# Patient Record
Sex: Female | Born: 1945
Health system: Southern US, Community
[De-identification: ages and names within clinical notes are randomized; demographics above are authoritative.]

## PROBLEM LIST (undated history)

## (undated) DIAGNOSIS — Z8719 Personal history of other diseases of the digestive system: Secondary | ICD-10-CM

## (undated) DIAGNOSIS — G709 Myoneural disorder, unspecified: Secondary | ICD-10-CM

## (undated) DIAGNOSIS — B029 Zoster without complications: Secondary | ICD-10-CM

## (undated) DIAGNOSIS — H179 Unspecified corneal scar and opacity: Secondary | ICD-10-CM

## (undated) DIAGNOSIS — R229 Localized swelling, mass and lump, unspecified: Secondary | ICD-10-CM

## (undated) DIAGNOSIS — K5909 Other constipation: Secondary | ICD-10-CM

## (undated) DIAGNOSIS — E559 Vitamin D deficiency, unspecified: Secondary | ICD-10-CM

## (undated) DIAGNOSIS — S7290XA Unspecified fracture of unspecified femur, initial encounter for closed fracture: Secondary | ICD-10-CM

## (undated) DIAGNOSIS — D649 Anemia, unspecified: Secondary | ICD-10-CM

## (undated) DIAGNOSIS — D509 Iron deficiency anemia, unspecified: Secondary | ICD-10-CM

## (undated) DIAGNOSIS — M1990 Unspecified osteoarthritis, unspecified site: Secondary | ICD-10-CM

## (undated) DIAGNOSIS — M810 Age-related osteoporosis without current pathological fracture: Secondary | ICD-10-CM

## (undated) DIAGNOSIS — M4802 Spinal stenosis, cervical region: Secondary | ICD-10-CM

## (undated) DIAGNOSIS — Z973 Presence of spectacles and contact lenses: Secondary | ICD-10-CM

## (undated) DIAGNOSIS — R269 Unspecified abnormalities of gait and mobility: Principal | ICD-10-CM

## (undated) DIAGNOSIS — H53143 Visual discomfort, bilateral: Secondary | ICD-10-CM

## (undated) HISTORY — PX: CATARACT EXTRACTION W/ INTRAOCULAR LENS  IMPLANT, BILATERAL: SHX1307

## (undated) HISTORY — PX: TUBAL LIGATION: SHX77

## (undated) HISTORY — DX: Zoster without complications: B02.9

## (undated) HISTORY — PX: VITRECTOMY AND CATARACT: SHX6184

## (undated) HISTORY — PX: ORIF FEMUR FRACTURE: SHX2119

## (undated) HISTORY — PX: ABDOMINAL HERNIA REPAIR: SHX539

## (undated) HISTORY — PX: OTHER SURGICAL HISTORY: SHX169

## (undated) HISTORY — DX: Spinal stenosis, cervical region: M48.02

## (undated) HISTORY — DX: Unspecified abnormalities of gait and mobility: R26.9

## (undated) HISTORY — PX: CATARACT EXTRACTION: SUR2

## (undated) HISTORY — DX: Anemia, unspecified: D64.9

## (undated) HISTORY — DX: Vitamin D deficiency, unspecified: E55.9

## (undated) HISTORY — DX: Unspecified fracture of unspecified femur, initial encounter for closed fracture: S72.90XA

## (undated) HISTORY — DX: Age-related osteoporosis without current pathological fracture: M81.0

## (undated) HISTORY — PX: BACK SURGERY: SHX140

---

## 1998-07-02 ENCOUNTER — Other Ambulatory Visit: Admission: RE | Admit: 1998-07-02 | Discharge: 1998-07-02 | Payer: Self-pay | Admitting: Obstetrics & Gynecology

## 1999-02-05 ENCOUNTER — Emergency Department (HOSPITAL_COMMUNITY): Admission: EM | Admit: 1999-02-05 | Discharge: 1999-02-06 | Payer: Self-pay | Admitting: Emergency Medicine

## 1999-02-05 ENCOUNTER — Encounter: Payer: Self-pay | Admitting: Emergency Medicine

## 1999-10-08 ENCOUNTER — Other Ambulatory Visit: Admission: RE | Admit: 1999-10-08 | Discharge: 1999-10-08 | Payer: Self-pay | Admitting: Obstetrics and Gynecology

## 2001-10-14 ENCOUNTER — Other Ambulatory Visit: Admission: RE | Admit: 2001-10-14 | Discharge: 2001-10-14 | Payer: Self-pay | Admitting: Family Medicine

## 2003-02-16 ENCOUNTER — Ambulatory Visit (HOSPITAL_COMMUNITY): Admission: RE | Admit: 2003-02-16 | Discharge: 2003-02-16 | Payer: Self-pay | Admitting: Gastroenterology

## 2006-01-06 ENCOUNTER — Ambulatory Visit (HOSPITAL_COMMUNITY): Admission: RE | Admit: 2006-01-06 | Discharge: 2006-01-06 | Payer: Self-pay | Admitting: Nurse Practitioner

## 2007-01-29 ENCOUNTER — Ambulatory Visit (HOSPITAL_COMMUNITY): Admission: RE | Admit: 2007-01-29 | Discharge: 2007-01-29 | Payer: Self-pay | Admitting: Sports Medicine

## 2007-02-17 ENCOUNTER — Encounter: Admission: RE | Admit: 2007-02-17 | Discharge: 2007-02-17 | Payer: Self-pay | Admitting: Family Medicine

## 2008-02-15 ENCOUNTER — Ambulatory Visit (HOSPITAL_COMMUNITY): Admission: RE | Admit: 2008-02-15 | Discharge: 2008-02-15 | Payer: Self-pay | Admitting: Family Medicine

## 2008-06-06 ENCOUNTER — Encounter: Admission: RE | Admit: 2008-06-06 | Discharge: 2008-06-06 | Payer: Self-pay | Admitting: Family Medicine

## 2009-07-06 ENCOUNTER — Ambulatory Visit (HOSPITAL_COMMUNITY): Admission: RE | Admit: 2009-07-06 | Discharge: 2009-07-06 | Payer: Self-pay | Admitting: Family Medicine

## 2010-07-22 ENCOUNTER — Ambulatory Visit (HOSPITAL_COMMUNITY): Admission: RE | Admit: 2010-07-22 | Discharge: 2010-07-22 | Payer: Self-pay | Admitting: Family Medicine

## 2010-12-29 ENCOUNTER — Encounter: Payer: Self-pay | Admitting: Family Medicine

## 2011-02-06 ENCOUNTER — Observation Stay (HOSPITAL_COMMUNITY)
Admission: RE | Admit: 2011-02-06 | Discharge: 2011-02-07 | Disposition: A | Discharge status: Home / Self Care | Payer: Medicare Other | Source: Ambulatory Visit | Attending: Ophthalmology | Admitting: Ophthalmology

## 2011-02-06 ENCOUNTER — Other Ambulatory Visit: Payer: Self-pay | Admitting: Ophthalmology

## 2011-02-06 DIAGNOSIS — H3530 Unspecified macular degeneration: Secondary | ICD-10-CM | POA: Insufficient documentation

## 2011-02-06 DIAGNOSIS — Z23 Encounter for immunization: Secondary | ICD-10-CM | POA: Insufficient documentation

## 2011-02-06 DIAGNOSIS — T8529XA Other mechanical complication of intraocular lens, initial encounter: Principal | ICD-10-CM | POA: Insufficient documentation

## 2011-02-06 DIAGNOSIS — H33309 Unspecified retinal break, unspecified eye: Secondary | ICD-10-CM | POA: Insufficient documentation

## 2011-02-06 DIAGNOSIS — Y831 Surgical operation with implant of artificial internal device as the cause of abnormal reaction of the patient, or of later complication, without mention of misadventure at the time of the procedure: Secondary | ICD-10-CM | POA: Insufficient documentation

## 2011-02-06 LAB — BASIC METABOLIC PANEL
Calcium: 9.1 mg/dL (ref 8.4–10.5)
Creatinine, Ser: 0.72 mg/dL (ref 0.4–1.2)
GFR calc Af Amer: >60 mL/min (ref >60)
GFR calc non Af Amer: >60 mL/min (ref >60)
Sodium: 139 mEq/L (ref 135–145)

## 2011-02-06 LAB — CBC
MCH: 33.8 pg (ref 26.0–34.0)
MCHC: 34.5 g/dL (ref 30.0–36.0)
Platelets: 160 10*3/uL (ref 150–400)

## 2011-02-06 LAB — SURGICAL PCR SCREEN
MRSA, PCR: NEGATIVE
Staphylococcus aureus: NEGATIVE

## 2011-02-10 NOTE — Op Note (Signed)
Amanda West, Amanda West                 ACCOUNT NO.:  [id number]  MEDICAL RECORD NO.:  [id number]           PATIENT TYPE:  I  LOCATION:  5121                         FACILITY:  [facility]  PHYSICIAN:  Beulah Gandy. Ashley Royalty, M.D. DATE OF BIRTH:  12/17/1945  DATE OF PROCEDURE:  02/06/2011 DATE OF DISCHARGE:                              OPERATIVE REPORT   ADMISSION DIAGNOSIS:  Dislocated intraocular lens and vitreous opacities and retinal breaks, right eye.  PROCEDURES:  Pars plana vitrectomy, retinal photocoagulation, removal of intraocular lens from the vitreous, placement of secondary intraocular lens with suture in the right eye.  SURGEON:  Beulah Gandy. Ashley Royalty, MD  ASSISTANT:  Rosalie Doctor, MA  ANESTHESIA:  General.  DETAILS:  Usual prep and drape, indirect ophthalmoscope laser was moved into place.  Several areas of thin retina and ghost retinal vessels were seen in the far periphery and treated with the indirect ophthalmoscope laser and the power was 200 milliwatts, 814 burns, 1000 microns each, and 0.1 seconds each.  Attention was then carried to the pars plana area where the conjunctival peritomy was carried out from 8 o'clock around to 4 o'clock.  Half-thickness scleral flaps were raised at 3 and 9 o'clock in anticipation of IOL suture.  Corneoscleral wound was created from 10 o'clock to 2 o'clock in the posterior white area of the sclera.  A three- layered wound was made into the anterior chamber.  Pars plana vitrectomy was performed with 25-gauge trocars at 8, 10, and 2 o'clock.  The vitrectomy was started in the pupillary axis where vitreous was seen. This was entangling the intraocular lens.  The lens was freed carefully with the suction cutter until it was allowed to lie free in the vitreous cavity.  The vitrectomy was expanded to the mid periphery and the far periphery out to the vitreous base.  Additional areas of breaks were seen in the lower temporal quadrant.  The endolaser  was positioned in the eye with a power of 1000 milliwatts, 556 burns were placed around retinal breaks, spot size 1000.  Duration 0.1 seconds each.  The intraocular lens was grasped with 25-gauge forceps passed into the anterior chamber then dialed out of the eye through the corneal scleral wound.  2 Prolene sutures were passed beneath the scleral flaps behind the iris and anterior to the capsular shelf from 3 o'clock to 9 o'clock. The sutures were externalized through the corneoscleral wound.  A new intraocular lens made by [employer]. model CZ70BD power 23.0D, length 12.5 mm, optic 7.0 mm, serial number [id number], expiration date January 2015 was brought onto the field, inspected, and cleaned.  Prolene sutures were attached to the eyelets of the lens. Lens was passed into the anterior chamber and then into the posterior chamber.  It was dialed into place and the Prolene sutures were drawn securely on the external surface.  The Prolene suture was sutured beneath the scleral flaps and the scleral flaps were allowed to lie overthe suture knots.  The corneal wound was closed with 5 interrupted 10-0 nylon sutures, it was tested and found to be tight.  Additional  vitrectomy was performed at this time and pigment was removed from the vitreous cavity.  30% gas fluid exchange was performed.  The instruments were removed from the eye.  The trocars were removed from the eye.  The wounds were tested and found to be tight.  The conjunctiva was reapproximated with 7-0 chromic suture.  Polymyxin and gentamicin were irrigated into [place] space.  Marcaine was injected around globe for postop pain.  Decadron 10 mg was injected into the lower subconjunctival space.  Closing pressure was 10 with a Risk manager.  COMPLICATIONS:  None.  DURATION:  1 hour and 30 minutes.  The patient was awakened, taken to recovery in satisfactory condition.     Beulah Gandy. Ashley Royalty, M.D.     JDM/MEDQ   D:  02/06/2011  T:  02/07/2011  Job:  [id number]  Electronically Signed by Alan Mulder M.D. on 02/10/2011 06:45:41 AM

## 2011-04-25 NOTE — Op Note (Signed)
   NAMESHAUNDREA, CARRIGG                             ACCOUNT NO.:  [id number]   MEDICAL RECORD NO.:  [id number]                   PATIENT TYPE:  AMB   LOCATION:  ENDO                                 FACILITY:  [facility]   PHYSICIAN:  Danise Edge, M.D.                DATE OF BIRTH:  09-12-1946   DATE OF PROCEDURE:  02/16/2003  DATE OF DISCHARGE:                                 OPERATIVE REPORT   REFERRING PHYSICIAN:  Donia Guiles, M.D.   PROCEDURE PERFORMED:  Screening colonoscopy.   ENDOSCOPIST:  Danise Edge, M.D.   PREMEDICATION:  Demerol 30 mg, Versed 5 mg.   ENDOSCOPE:  Olympus pediatric colonoscope.   INDICATIONS FOR PROCEDURE:  The patient is a 65 year old female born  1946/03/26.  The patient is scheduled to undergo her first screening  colonoscopy with polypectomy to prevent colon cancer.  I discussed with her  the complications associated with colonoscopy and polypectomy including a  15:1000 risk of bleeding and 1:1000 risk of colon perforation.  The patient  has signed the operative permit.   DESCRIPTION OF PROCEDURE:  After obtaining informed consent, the patient was  placed in the left lateral decubitus position.  I administered intravenous  Demerol and intravenous Versed to achieve conscious sedation for the  procedure.  The patient's blood pressure, oxygen saturation, and cardiac  rhythm were monitored throughout the procedure and documented in the medical  record.   Anal inspection was normal.  Digital rectal exam was normal.  The Olympus  pediatric video colonoscope was introduced into the rectum and easily  advanced to the cecum.  A normal appearing ileocecal valve was intubated and  the distal ileum inspected.  Colonic preparation for the exam today was  excellent.   The patient does have mucosal changes consistent with melanosis coli  throughout the rectum and colon.   RECTUM:  Normal.   SIGMOID COLON AND DESCENDING COLON:  Normal.   SPLENIC  FLEXURE:  Normal.   TRANSVERSE COLON:  Normal.   HEPATIC FLEXURE:  Normal.   ASCENDING COLON:  Normal.   ASCENDING COLON:  Normal.   CECUM AND ILEOCECAL VALVE:  Normal.   DISTAL ILEUM:  Normal.   ASSESSMENT:  Normal screening proctocolonoscopy to the cecum.  No endoscopic  evidence for the presence of colorectal neoplasia.  Melanosis coli noted  secondary to laxative use.                                                Danise Edge, M.D.    MJ/MEDQ  D:  02/16/2003  T:  02/16/2003  Job:  [id number]   cc:   Donia Guiles, M.D.  [address] [place]  [place] [address]  Fax: [phone]

## 2011-08-14 ENCOUNTER — Other Ambulatory Visit: Payer: Self-pay | Admitting: Family Medicine

## 2011-08-14 DIAGNOSIS — Z1231 Encounter for screening mammogram for malignant neoplasm of breast: Secondary | ICD-10-CM

## 2011-08-27 ENCOUNTER — Ambulatory Visit (HOSPITAL_COMMUNITY)
Admission: RE | Admit: 2011-08-27 | Discharge: 2011-08-27 | Disposition: A | Discharge status: Home / Self Care | Payer: Medicare Other | Source: Ambulatory Visit | Attending: Family Medicine | Admitting: Family Medicine

## 2011-08-27 DIAGNOSIS — Z1231 Encounter for screening mammogram for malignant neoplasm of breast: Secondary | ICD-10-CM | POA: Insufficient documentation

## 2012-06-23 ENCOUNTER — Ambulatory Visit (INDEPENDENT_AMBULATORY_CARE_PROVIDER_SITE_OTHER): Payer: Medicare Other | Admitting: Ophthalmology

## 2012-06-23 DIAGNOSIS — H2700 Aphakia, unspecified eye: Secondary | ICD-10-CM

## 2012-06-23 DIAGNOSIS — H27139 Posterior dislocation of lens, unspecified eye: Secondary | ICD-10-CM

## 2012-06-23 DIAGNOSIS — H353190 Nonexudative age-related macular degeneration, unspecified eye, stage unspecified: Secondary | ICD-10-CM

## 2012-06-23 DIAGNOSIS — H43819 Vitreous degeneration, unspecified eye: Secondary | ICD-10-CM

## 2012-06-24 ENCOUNTER — Encounter (HOSPITAL_COMMUNITY): Payer: Self-pay | Admitting: Pharmacy Technician

## 2012-06-24 DIAGNOSIS — H27139 Posterior dislocation of lens, unspecified eye: Secondary | ICD-10-CM | POA: Diagnosis present

## 2012-06-24 DIAGNOSIS — T8522XA Displacement of intraocular lens, initial encounter: Secondary | ICD-10-CM | POA: Diagnosis present

## 2012-06-24 NOTE — H&P (Signed)
Vergene Marland is an 66 y.o. female.   Chief Complaint: blurred vision, hazy vision HPI: intraocular lens dislocation inferiorly and in the vitreous left eyr  No past medical history on file.  No past surgical history on file.  No family history on file. Social History:  does not have a smoking history on file. She does not have any smokeless tobacco history on file. Her alcohol and drug histories not on file.  Allergies: Allergies not on file  No prescriptions prior to admission    Review of systems otherwise negative  There were no vitals taken for this visit.  Physical exam: Mental status: oriented x3. Eyes: See eye exam associated with this date of surgery in media tab.  Scanned in by scanning center Ears, Nose, Throat: within normal limits Neck: Within Normal limits General: within normal limits Chest: Within normal limits Breast: deferred Heart: Within normal limits Abdomen: Within normal limits GU: deferred Extremities: within normal limits Skin: within normal limits  Assessment/Plan Dislocated Intraocular lens Plan: To [facility] for Pars plana vitrectomy, removal of Intraocular lens, placement of secondary intraocular lens left eye  MATTHEWS, JOHN D 06/24/2012, 7:15 AM

## 2012-07-05 ENCOUNTER — Encounter (HOSPITAL_COMMUNITY): Payer: Self-pay | Admitting: *Deleted

## 2012-07-05 MED ORDER — TROPICAMIDE 1 % OP SOLN
1.0000 [drp] | OPHTHALMIC | Status: AC | PRN
  Administered 2012-07-06 (×3): 1 [drp] via OPHTHALMIC
  Filled 2012-07-05: qty 3

## 2012-07-05 MED ORDER — PHENYLEPHRINE HCL 2.5 % OP SOLN
1.0000 [drp] | OPHTHALMIC | Status: AC | PRN
  Administered 2012-07-06 (×3): 1 [drp] via OPHTHALMIC
  Filled 2012-07-05: qty 3

## 2012-07-05 MED ORDER — GATIFLOXACIN 0.5 % OP SOLN
1.0000 [drp] | OPHTHALMIC | Status: AC | PRN
  Administered 2012-07-06 (×3): 1 [drp] via OPHTHALMIC
  Filled 2012-07-05: qty 2.5

## 2012-07-05 MED ORDER — CEFAZOLIN SODIUM-DEXTROSE 2-3 GM-% IV SOLR
2.0000 g | INTRAVENOUS | Status: AC
  Administered 2012-07-06: 2 g via INTRAVENOUS
  Filled 2012-07-05: qty 50

## 2012-07-05 MED ORDER — CYCLOPENTOLATE HCL 1 % OP SOLN
1.0000 [drp] | OPHTHALMIC | Status: AC | PRN
  Administered 2012-07-06 (×3): 1 [drp] via OPHTHALMIC
  Filled 2012-07-05: qty 2

## 2012-07-05 NOTE — Progress Notes (Signed)
I left message on pt's voice mail with time change, pt was to arrive at 0900 when OR was scheduled at  1315.  I instructed patient to arrive by 0800 if she had not heard anything different from Dr Ashley Royalty.

## 2012-07-06 ENCOUNTER — Encounter (HOSPITAL_COMMUNITY): Payer: Self-pay | Admitting: Anesthesiology

## 2012-07-06 ENCOUNTER — Encounter (HOSPITAL_COMMUNITY)
Admission: RE | Disposition: A | Discharge status: Home / Self Care | Payer: Self-pay | Source: Ambulatory Visit | Attending: Ophthalmology

## 2012-07-06 ENCOUNTER — Ambulatory Visit (HOSPITAL_COMMUNITY): Payer: Medicare Other

## 2012-07-06 ENCOUNTER — Ambulatory Visit (HOSPITAL_COMMUNITY): Payer: Medicare Other | Admitting: Anesthesiology

## 2012-07-06 ENCOUNTER — Ambulatory Visit (HOSPITAL_COMMUNITY)
Admission: RE | Admit: 2012-07-06 | Discharge: 2012-07-07 | Disposition: A | Discharge status: Home / Self Care | Payer: Medicare Other | Source: Ambulatory Visit | Attending: Ophthalmology | Admitting: Ophthalmology

## 2012-07-06 ENCOUNTER — Encounter (HOSPITAL_COMMUNITY): Payer: Self-pay | Admitting: *Deleted

## 2012-07-06 DIAGNOSIS — H27139 Posterior dislocation of lens, unspecified eye: Secondary | ICD-10-CM

## 2012-07-06 DIAGNOSIS — H33309 Unspecified retinal break, unspecified eye: Secondary | ICD-10-CM

## 2012-07-06 DIAGNOSIS — T8522XA Displacement of intraocular lens, initial encounter: Secondary | ICD-10-CM

## 2012-07-06 DIAGNOSIS — T8529XA Other mechanical complication of intraocular lens, initial encounter: Secondary | ICD-10-CM | POA: Insufficient documentation

## 2012-07-06 DIAGNOSIS — H26499 Other secondary cataract, unspecified eye: Secondary | ICD-10-CM

## 2012-07-06 DIAGNOSIS — Y831 Surgical operation with implant of artificial internal device as the cause of abnormal reaction of the patient, or of later complication, without mention of misadventure at the time of the procedure: Secondary | ICD-10-CM | POA: Insufficient documentation

## 2012-07-06 HISTORY — DX: Personal history of other diseases of the digestive system: Z87.19

## 2012-07-06 HISTORY — PX: PARS PLANA VITRECTOMY: SHX2166

## 2012-07-06 SURGERY — PARS PLANA VITRECTOMY WITH 25G REMOVAL/SUTURE INTRAOCULAR LENS
Anesthesia: General | Laterality: Left | Wound class: Clean

## 2012-07-06 MED ORDER — ACETAZOLAMIDE SODIUM 500 MG IJ SOLR
INTRAMUSCULAR | Status: AC
  Filled 2012-07-06: qty 500

## 2012-07-06 MED ORDER — BSS PLUS IO SOLN
INTRAOCULAR | Status: AC
  Filled 2012-07-06: qty 500

## 2012-07-06 MED ORDER — POTASSIUM 99 MG PO TABS: 99.0000 mg | ORAL_TABLET | Freq: Every day | ORAL | Status: DC

## 2012-07-06 MED ORDER — MUPIROCIN 2 % EX OINT
TOPICAL_OINTMENT | CUTANEOUS | Status: AC
  Administered 2012-07-06: 1 via NASAL
  Filled 2012-07-06: qty 22

## 2012-07-06 MED ORDER — GATIFLOXACIN 0.5 % OP SOLN: 1.0000 [drp] | Freq: Four times a day (QID) | OPHTHALMIC | Status: DC

## 2012-07-06 MED ORDER — MINERAL OIL LIGHT 100 % EX OIL
TOPICAL_OIL | CUTANEOUS | Status: AC
  Filled 2012-07-06: qty 25

## 2012-07-06 MED ORDER — EPINEPHRINE HCL 1 MG/ML IJ SOLN
INTRAMUSCULAR | Status: AC
  Filled 2012-07-06: qty 1

## 2012-07-06 MED ORDER — LIDOCAINE HCL 1 % IJ SOLN
INTRAMUSCULAR | Status: DC | PRN
  Administered 2012-07-06: 60 mg via INTRADERMAL

## 2012-07-06 MED ORDER — HEMOSTATIC AGENTS (NO CHARGE) OPTIME
TOPICAL | Status: DC | PRN
  Administered 2012-07-06: 1 via TOPICAL

## 2012-07-06 MED ORDER — ONDANSETRON HCL 4 MG/2ML IJ SOLN
INTRAMUSCULAR | Status: DC | PRN
  Administered 2012-07-06: 4 mg via INTRAVENOUS

## 2012-07-06 MED ORDER — POLYMYXIN B SULFATE 500000 UNITS IJ SOLR
INTRAMUSCULAR | Status: AC
  Filled 2012-07-06: qty 1

## 2012-07-06 MED ORDER — BUPIVACAINE HCL 0.75 % IJ SOLN
INTRAMUSCULAR | Status: AC
  Filled 2012-07-06: qty 10

## 2012-07-06 MED ORDER — MINERAL OIL LIGHT 100 % EX OIL
TOPICAL_OIL | CUTANEOUS | Status: DC | PRN
  Administered 2012-07-06: 1 via TOPICAL

## 2012-07-06 MED ORDER — GENTAMICIN SULFATE 40 MG/ML IJ SOLN
INTRAMUSCULAR | Status: AC
  Filled 2012-07-06: qty 2

## 2012-07-06 MED ORDER — SODIUM CHLORIDE 0.45 % IV SOLN
INTRAVENOUS | Status: DC
  Administered 2012-07-06: 20 mL/h via INTRAVENOUS

## 2012-07-06 MED ORDER — PROMETHAZINE HCL 25 MG/ML IJ SOLN: 6.2500 mg | INTRAMUSCULAR | Status: DC | PRN

## 2012-07-06 MED ORDER — SODIUM CHLORIDE 0.9 % IV SOLN
INTRAVENOUS | Status: DC | PRN
  Administered 2012-07-06 (×2): via INTRAVENOUS

## 2012-07-06 MED ORDER — MAGNESIUM HYDROXIDE 400 MG/5ML PO SUSP
15.0000 mL | Freq: Four times a day (QID) | ORAL | Status: DC | PRN
  Administered 2012-07-07: 30 mL via ORAL
  Filled 2012-07-06: qty 30

## 2012-07-06 MED ORDER — BSS IO SOLN
INTRAOCULAR | Status: AC
  Filled 2012-07-06: qty 15

## 2012-07-06 MED ORDER — MEPERIDINE HCL 25 MG/ML IJ SOLN: 6.2500 mg | INTRAMUSCULAR | Status: DC | PRN

## 2012-07-06 MED ORDER — FENTANYL CITRATE 0.05 MG/ML IJ SOLN
INTRAMUSCULAR | Status: DC | PRN
  Administered 2012-07-06 (×2): 50 ug via INTRAVENOUS

## 2012-07-06 MED ORDER — SODIUM CHLORIDE 0.9 % IJ SOLN
INTRAMUSCULAR | Status: DC | PRN
  Administered 2012-07-06: 13:00:00

## 2012-07-06 MED ORDER — EPINEPHRINE HCL 1 MG/ML IJ SOLN
INTRAOCULAR | Status: DC | PRN
  Administered 2012-07-06: 11:00:00

## 2012-07-06 MED ORDER — BRIMONIDINE TARTRATE 0.2 % OP SOLN
1.0000 [drp] | Freq: Two times a day (BID) | OPHTHALMIC | Status: DC
  Filled 2012-07-06: qty 5

## 2012-07-06 MED ORDER — LATANOPROST 0.005 % OP SOLN
1.0000 [drp] | Freq: Every day | OPHTHALMIC | Status: DC
  Filled 2012-07-06 (×2): qty 2.5

## 2012-07-06 MED ORDER — HYALURONIDASE HUMAN 150 UNIT/ML IJ SOLN
INTRAMUSCULAR | Status: AC
  Filled 2012-07-06: qty 1

## 2012-07-06 MED ORDER — BSS PLUS IO SOLN
INTRAOCULAR | Status: DC | PRN
  Administered 2012-07-06: 1 via INTRAOCULAR

## 2012-07-06 MED ORDER — NEOSTIGMINE METHYLSULFATE 1 MG/ML IJ SOLN
INTRAMUSCULAR | Status: DC | PRN
  Administered 2012-07-06: 3 mg via INTRAVENOUS

## 2012-07-06 MED ORDER — PREDNISOLONE ACETATE 1 % OP SUSP
1.0000 [drp] | Freq: Four times a day (QID) | OPHTHALMIC | Status: DC
  Filled 2012-07-06: qty 1
  Filled 2012-07-06: qty 5

## 2012-07-06 MED ORDER — GLYCOPYRROLATE 0.2 MG/ML IJ SOLN
INTRAMUSCULAR | Status: DC | PRN
  Administered 2012-07-06: .4 mg via INTRAVENOUS
  Administered 2012-07-06: 0.2 mg via INTRAVENOUS

## 2012-07-06 MED ORDER — MIDAZOLAM HCL 5 MG/5ML IJ SOLN
INTRAMUSCULAR | Status: DC | PRN
  Administered 2012-07-06: 2 mg via INTRAVENOUS

## 2012-07-06 MED ORDER — ATROPINE SULFATE 1 % OP SOLN
OPHTHALMIC | Status: AC
  Filled 2012-07-06: qty 2

## 2012-07-06 MED ORDER — PROPOFOL 10 MG/ML IV EMUL
INTRAVENOUS | Status: DC | PRN
  Administered 2012-07-06: 150 mg via INTRAVENOUS

## 2012-07-06 MED ORDER — BUPIVACAINE HCL 0.75 % IJ SOLN
INTRAMUSCULAR | Status: DC | PRN
  Administered 2012-07-06: 10 mL

## 2012-07-06 MED ORDER — SODIUM HYALURONATE 10 MG/ML IO SOLN
INTRAOCULAR | Status: AC
  Filled 2012-07-06: qty 0.85

## 2012-07-06 MED ORDER — BACITRACIN-POLYMYXIN B 500-10000 UNIT/GM OP OINT
TOPICAL_OINTMENT | OPHTHALMIC | Status: AC
  Filled 2012-07-06: qty 3.5

## 2012-07-06 MED ORDER — BRIMONIDINE TARTRATE 0.2 % OP SOLN
1.0000 [drp] | Freq: Two times a day (BID) | OPHTHALMIC | Status: DC
  Filled 2012-07-06 (×2): qty 5

## 2012-07-06 MED ORDER — HYDROCODONE-ACETAMINOPHEN 5-325 MG PO TABS: 1.0000 | ORAL_TABLET | ORAL | Status: DC | PRN

## 2012-07-06 MED ORDER — BSS IO SOLN
INTRAOCULAR | Status: DC | PRN
  Administered 2012-07-06: 15 mL via INTRAOCULAR

## 2012-07-06 MED ORDER — TETRACAINE HCL 0.5 % OP SOLN
2.0000 [drp] | Freq: Once | OPHTHALMIC | Status: DC
  Filled 2012-07-06: qty 2

## 2012-07-06 MED ORDER — ROCURONIUM BROMIDE 100 MG/10ML IV SOLN
INTRAVENOUS | Status: DC | PRN
  Administered 2012-07-06: 40 mg via INTRAVENOUS

## 2012-07-06 MED ORDER — MORPHINE SULFATE 2 MG/ML IJ SOLN: 1.0000 mg | INTRAMUSCULAR | Status: DC | PRN

## 2012-07-06 MED ORDER — PREDNISOLONE ACETATE 1 % OP SUSP
1.0000 [drp] | Freq: Four times a day (QID) | OPHTHALMIC | Status: DC
  Filled 2012-07-06: qty 1

## 2012-07-06 MED ORDER — OXYCODONE HCL 5 MG/5ML PO SOLN: 5.0000 mg | Freq: Once | ORAL | Status: DC | PRN

## 2012-07-06 MED ORDER — GATIFLOXACIN 0.5 % OP SOLN
1.0000 [drp] | Freq: Four times a day (QID) | OPHTHALMIC | Status: DC
  Filled 2012-07-06: qty 2.5

## 2012-07-06 MED ORDER — HYDROMORPHONE HCL PF 1 MG/ML IJ SOLN
0.2500 mg | INTRAMUSCULAR | Status: DC | PRN
  Administered 2012-07-06 (×2): 0.5 mg via INTRAVENOUS

## 2012-07-06 MED ORDER — EPHEDRINE SULFATE 50 MG/ML IJ SOLN
INTRAMUSCULAR | Status: DC | PRN
  Administered 2012-07-06: 10 mg via INTRAVENOUS

## 2012-07-06 MED ORDER — DEXAMETHASONE SODIUM PHOSPHATE 10 MG/ML IJ SOLN
INTRAMUSCULAR | Status: AC
  Filled 2012-07-06: qty 1

## 2012-07-06 MED ORDER — HYDROMORPHONE HCL PF 1 MG/ML IJ SOLN
INTRAMUSCULAR | Status: AC
  Filled 2012-07-06: qty 1

## 2012-07-06 MED ORDER — DEXAMETHASONE SODIUM PHOSPHATE 10 MG/ML IJ SOLN
INTRAMUSCULAR | Status: DC | PRN
  Administered 2012-07-06: 10 mg

## 2012-07-06 MED ORDER — LIDOCAINE HCL 2 % IJ SOLN
INTRAMUSCULAR | Status: AC
  Filled 2012-07-06: qty 1

## 2012-07-06 MED ORDER — BACITRACIN-POLYMYXIN B 500-10000 UNIT/GM OP OINT
1.0000 "application " | TOPICAL_OINTMENT | Freq: Four times a day (QID) | OPHTHALMIC | Status: DC
  Filled 2012-07-06: qty 3.5

## 2012-07-06 MED ORDER — HYPROMELLOSE (GONIOSCOPIC) 2.5 % OP SOLN
OPHTHALMIC | Status: AC
  Filled 2012-07-06: qty 15

## 2012-07-06 MED ORDER — DOCUSATE SODIUM 100 MG PO CAPS
100.0000 mg | ORAL_CAPSULE | Freq: Two times a day (BID) | ORAL | Status: DC
  Administered 2012-07-06: 100 mg via ORAL
  Filled 2012-07-06: qty 1

## 2012-07-06 MED ORDER — PROVISC 10 MG/ML IO SOLN
INTRAOCULAR | Status: DC | PRN
  Administered 2012-07-06: .85 mL via INTRAOCULAR

## 2012-07-06 MED ORDER — ACETAZOLAMIDE SODIUM 500 MG IJ SOLR: 500.0000 mg | Freq: Once | INTRAMUSCULAR | Status: DC

## 2012-07-06 MED ORDER — OXYCODONE HCL 5 MG PO TABS: 5.0000 mg | ORAL_TABLET | Freq: Once | ORAL | Status: DC | PRN

## 2012-07-06 MED ORDER — LATANOPROST 0.005 % OP SOLN
1.0000 [drp] | Freq: Every day | OPHTHALMIC | Status: DC
  Filled 2012-07-06: qty 2.5

## 2012-07-06 MED ORDER — ACETAZOLAMIDE SODIUM 500 MG IJ SOLR
500.0000 mg | Freq: Once | INTRAMUSCULAR | Status: AC
  Administered 2012-07-07: 500 mg via INTRAVENOUS
  Filled 2012-07-06: qty 500

## 2012-07-06 MED ORDER — TEMAZEPAM 15 MG PO CAPS: 15.0000 mg | ORAL_CAPSULE | Freq: Every evening | ORAL | Status: DC | PRN

## 2012-07-06 MED ORDER — BACITRACIN-POLYMYXIN B 500-10000 UNIT/GM OP OINT
TOPICAL_OINTMENT | OPHTHALMIC | Status: DC | PRN
  Administered 2012-07-06: 1 via OPHTHALMIC

## 2012-07-06 MED ORDER — SODIUM CHLORIDE 0.9 % IV SOLN
INTRAVENOUS | Status: DC
  Administered 2012-07-06: 11:00:00 via INTRAVENOUS

## 2012-07-06 MED ORDER — TRIAMCINOLONE ACETONIDE 40 MG/ML IJ SUSP
INTRAMUSCULAR | Status: AC
  Filled 2012-07-06: qty 1

## 2012-07-06 MED ORDER — ACETAMINOPHEN 325 MG PO TABS
325.0000 mg | ORAL_TABLET | ORAL | Status: DC | PRN
  Administered 2012-07-06: 650 mg via ORAL
  Administered 2012-07-07: 325 mg via ORAL
  Filled 2012-07-06: qty 1
  Filled 2012-07-06: qty 2

## 2012-07-06 MED ORDER — ONDANSETRON HCL 4 MG/2ML IJ SOLN: 4.0000 mg | Freq: Four times a day (QID) | INTRAMUSCULAR | Status: DC | PRN

## 2012-07-06 SURGICAL SUPPLY — 69 items
ACCESSORY FRAGMATOME (MISCELLANEOUS) IMPLANT
APPLICATOR DR MATTHEWS STRL (MISCELLANEOUS) ×2 IMPLANT
BLADE EYE CATARACT 19 1.4 BEAV (BLADE) IMPLANT
BLADE KERATOME 2.75 (BLADE) ×2 IMPLANT
CANNULA FLEX TIP 25G (CANNULA) IMPLANT
CLOTH BEACON ORANGE TIMEOUT ST (SAFETY) ×2 IMPLANT
CORDS BIPOLAR (ELECTRODE) IMPLANT
COTTONBALL LRG STERILE PKG (GAUZE/BANDAGES/DRESSINGS) ×6 IMPLANT
COVER MAYO STAND STRL (DRAPES) IMPLANT
DRAPE INCISE 51X51 W/FILM STRL (DRAPES) IMPLANT
DRAPE OPHTHALMIC 77X100 STRL (CUSTOM PROCEDURE TRAY) ×2 IMPLANT
FILTER BLUE MILLIPORE (MISCELLANEOUS) IMPLANT
FORCEPS ECKARDT ILM 25G SERR (OPHTHALMIC RELATED) ×2 IMPLANT
FORCEPS HORIZONTAL 25G DISP (OPHTHALMIC RELATED) IMPLANT
GAS OPHTHALMIC (MISCELLANEOUS) IMPLANT
GLOVE BIOGEL PI IND STRL 6.5 (GLOVE) ×1 IMPLANT
GLOVE BIOGEL PI INDICATOR 6.5 (GLOVE) ×1
GLOVE SS BIOGEL STRL SZ 6.5 (GLOVE) ×3 IMPLANT
GLOVE SS BIOGEL STRL SZ 7 (GLOVE) ×1 IMPLANT
GLOVE SUPERSENSE BIOGEL SZ 6.5 (GLOVE) ×3
GLOVE SUPERSENSE BIOGEL SZ 7 (GLOVE) ×1
GLOVE SURG 8.5 LATEX PF (GLOVE) ×2 IMPLANT
GLOVE SURG SS PI 6.5 STRL IVOR (GLOVE) ×4 IMPLANT
GOWN STRL NON-REIN LRG LVL3 (GOWN DISPOSABLE) ×14 IMPLANT
ILLUMINATOR CHOW PICK 25GA (MISCELLANEOUS) ×2 IMPLANT
KIT BASIN OR (CUSTOM PROCEDURE TRAY) ×2 IMPLANT
KIT ROOM TURNOVER OR (KITS) ×2 IMPLANT
KNIFE CRESCENT 1.75 EDGEAHEAD (BLADE) ×2 IMPLANT
LENS IOL POST 1PIECE DIOP 22.0 (Intraocular Lens) ×2 IMPLANT
MARKER SKIN DUAL TIP RULER LAB (MISCELLANEOUS) IMPLANT
MASK EYE SHIELD (GAUZE/BANDAGES/DRESSINGS) ×2 IMPLANT
MICROPICK 25G (MISCELLANEOUS)
NEEDLE 18GX1X1/2 (RX/OR ONLY) (NEEDLE) ×2 IMPLANT
NEEDLE 25GX 5/8IN NON SAFETY (NEEDLE) ×2 IMPLANT
NEEDLE 27GAX1X1/2 (NEEDLE) ×2 IMPLANT
NEEDLE FILTER BLUNT 18X 1/2SAF (NEEDLE) ×1
NEEDLE FILTER BLUNT 18X1 1/2 (NEEDLE) ×1 IMPLANT
NEEDLE HYPO 30X.5 LL (NEEDLE) ×2 IMPLANT
NS IRRIG 1000ML POUR BTL (IV SOLUTION) ×2 IMPLANT
PACK VITRECTOMY CUSTOM (CUSTOM PROCEDURE TRAY) ×2 IMPLANT
PAD ARMBOARD 7.5X6 YLW CONV (MISCELLANEOUS) ×4 IMPLANT
PAD EYE OVAL STERILE LF (GAUZE/BANDAGES/DRESSINGS) ×4 IMPLANT
PAK VITRECTOMY PIK 25 GA (OPHTHALMIC RELATED) ×2 IMPLANT
PENCIL BIPOLAR 25GA STR DISP (OPHTHALMIC RELATED) IMPLANT
PICK MICROPICK 25G (MISCELLANEOUS) IMPLANT
PROBE DIRECTIONAL LASER (MISCELLANEOUS) IMPLANT
PROBE VITREOUS 25+ ACCURUS (MISCELLANEOUS) IMPLANT
ROLLS DENTAL (MISCELLANEOUS) ×4 IMPLANT
SCRAPER DIAMOND 25GA (OPHTHALMIC RELATED) IMPLANT
SPEAR EYE SURG WECK-CEL (MISCELLANEOUS) ×6 IMPLANT
SPONGE SURGIFOAM ABS GEL 12-7 (HEMOSTASIS) ×2 IMPLANT
STOPCOCK 4 WAY LG BORE MALE ST (IV SETS) IMPLANT
SUT CHROMIC 7 0 TG140 8 (SUTURE) ×2 IMPLANT
SUT ETHILON 10 0 CS140 6 (SUTURE) ×2 IMPLANT
SUT ETHILON 9 0 BV100 4 (SUTURE) IMPLANT
SUT POLY NON ABSORB 10-0 8 STR (SUTURE) ×4 IMPLANT
SUT SILK 2 0 FS (SUTURE) ×2 IMPLANT
SUT SILK 4 0 RB 1 (SUTURE) IMPLANT
SYR 20CC LL (SYRINGE) ×2 IMPLANT
SYR 5ML LL (SYRINGE) IMPLANT
SYR BULB 3OZ (MISCELLANEOUS) ×2 IMPLANT
SYR TB 1ML LUER SLIP (SYRINGE) ×2 IMPLANT
SYRINGE 10CC LL (SYRINGE) IMPLANT
TAPE SURG TRANSPORE 1 IN (GAUZE/BANDAGES/DRESSINGS) ×1 IMPLANT
TAPE SURGICAL TRANSPORE 1 IN (GAUZE/BANDAGES/DRESSINGS) ×1
TOWEL OR 17X24 6PK STRL BLUE (TOWEL DISPOSABLE) ×6 IMPLANT
TROCAR CANNULA 25GA (CANNULA) IMPLANT
WATER STERILE IRR 1000ML POUR (IV SOLUTION) ×2 IMPLANT
WIPE INSTRUMENT VISIWIPE 73X73 (MISCELLANEOUS) ×2 IMPLANT

## 2012-07-06 NOTE — H&P (Signed)
I examined the patient today and there is no change in the medical status 

## 2012-07-06 NOTE — Preoperative (Signed)
Beta Blockers   Reason not to administer Beta Blockers:Not Applicable 

## 2012-07-06 NOTE — Anesthesia Procedure Notes (Signed)
Procedure Name: Intubation Date/Time: 07/06/2012 12:03 PM Performed by: Leona Singleton A Pre-anesthesia Checklist: Patient identified Patient Re-evaluated:Patient Re-evaluated prior to inductionOxygen Delivery Method: Circle system utilized Preoxygenation: Pre-oxygenation with 100% oxygen Intubation Type: IV induction Ventilation: Mask ventilation without difficulty Laryngoscope Size: Miller and 2 Grade View: Grade I Tube type: Oral Tube size: 7.0 mm Number of attempts: 1 Airway Equipment and Method: Stylet Placement Confirmation: ETT inserted through vocal cords under direct vision,  positive ETCO2 and breath sounds checked- equal and bilateral Secured at: 21 cm Tube secured with: Tape Dental Injury: Teeth and Oropharynx as per pre-operative assessment

## 2012-07-06 NOTE — Brief Op Note (Signed)
Brief Operative note   Preoperative diagnosis:  Pre-Op Diagnosis Codes:    * Posterior dislocation of lens [379.34] Postoperative diagnosis  Post-Op Diagnosis Codes:    * Posterior dislocation of lens [379.34]  Procedures: Pars plana vitrectomy, removal of intraocular lens from vitreous, laser photocoagulation, placement of secondary intraocular lens left eye  Surgeon:  Sherrie George, MD...  Assistant:  Rosalie Doctor SA    Anesthesia: General  Specimen: none  Estimated blood loss:  1cc  Complications: none  Patient sent to PACU in good condition  Composed by Sherrie George MD  Dictation number: [phone]

## 2012-07-06 NOTE — Transfer of Care (Signed)
Immediate Anesthesia Transfer of Care Note  Patient: Amanda West  Procedure(s) Performed: Procedure(s) (LRB): PARS PLANA VITRECTOMY WITH 25G REMOVAL/SUTURE INTRAOCULAR LENS (Left)  Patient Location: PACU  Anesthesia Type: General  Level of Consciousness: awake, alert  and patient cooperative  Airway & Oxygen Therapy: Patient Spontanous Breathing and Patient connected to face mask oxygen  Post-op Assessment: Report given to PACU RN and Post -op Vital signs reviewed and stable  Post vital signs: Reviewed and stable  Complications: No apparent anesthesia complications

## 2012-07-06 NOTE — Anesthesia Postprocedure Evaluation (Signed)
  Anesthesia Post-op Note  Patient: Amanda West  Procedure(s) Performed: Procedure(s) (LRB): PARS PLANA VITRECTOMY WITH 25G REMOVAL/SUTURE INTRAOCULAR LENS (Left)  Patient Location: PACU  Anesthesia Type: General  Level of Consciousness: awake  Airway and Oxygen Therapy: Patient Spontanous Breathing  Post-op Pain: mild  Post-op Assessment: Post-op Vital signs reviewed  Post-op Vital Signs: stable  Complications: No apparent anesthesia complications

## 2012-07-06 NOTE — Anesthesia Preprocedure Evaluation (Addendum)
Anesthesia Evaluation  Patient identified by MRN, date of birth, ID band Patient awake    Reviewed: Allergy & Precautions, H&P , NPO status , Patient's Chart, lab work & pertinent test results  History of Anesthesia Complications Negative for: history of anesthetic complications  Airway Mallampati: II TM Distance: >3 FB Neck ROM: Full    Dental  (+) Teeth Intact and Dental Advisory Given   Pulmonary former smoker,          Cardiovascular negative cardio ROS      Neuro/Psych negative neurological ROS  negative psych ROS   GI/Hepatic negative GI ROS, Neg liver ROS, hiatal hernia,   Endo/Other  negative endocrine ROS  Renal/GU negative Renal ROS  negative genitourinary   Musculoskeletal negative musculoskeletal ROS (+)   Abdominal   Peds negative pediatric ROS (+)  Hematology negative hematology ROS (+)   Anesthesia Other Findings   Reproductive/Obstetrics                           Anesthesia Physical Anesthesia Plan  ASA: I  Anesthesia Plan: General   Post-op Pain Management:    Induction: Intravenous  Airway Management Planned: Oral ETT  Additional Equipment:   Intra-op Plan:   Post-operative Plan: Extubation in OR  Informed Consent: I have reviewed the patients History and Physical, chart, labs and discussed the procedure including the risks, benefits and alternatives for the proposed anesthesia with the patient or authorized representative who has indicated his/her understanding and acceptance.   Dental advisory given  Plan Discussed with: CRNA and Surgeon  Anesthesia Plan Comments:         Anesthesia Quick Evaluation

## 2012-07-07 ENCOUNTER — Encounter (HOSPITAL_COMMUNITY): Payer: Self-pay | Admitting: Ophthalmology

## 2012-07-07 MED ORDER — PREDNISOLONE ACETATE 1 % OP SUSP: 1.0000 [drp] | Freq: Four times a day (QID) | OPHTHALMIC | Status: AC

## 2012-07-07 MED ORDER — HYDROCODONE-ACETAMINOPHEN 5-325 MG PO TABS: 1.0000 | ORAL_TABLET | ORAL | Status: AC | PRN

## 2012-07-07 MED ORDER — GATIFLOXACIN 0.5 % OP SOLN: 1.0000 [drp] | Freq: Four times a day (QID) | OPHTHALMIC | Status: DC

## 2012-07-07 MED ORDER — KETOROLAC TROMETHAMINE 0.5 % OP SOLN
1.0000 [drp] | Freq: Four times a day (QID) | OPHTHALMIC | Status: DC
  Filled 2012-07-07: qty 3

## 2012-07-07 MED ORDER — KETOROLAC TROMETHAMINE 0.5 % OP SOLN
1.0000 [drp] | Freq: Once | OPHTHALMIC | Status: AC
  Administered 2012-07-07: 1 [drp] via OPHTHALMIC
  Filled 2012-07-07 (×2): qty 3

## 2012-07-07 MED ORDER — BACITRACIN-POLYMYXIN B 500-10000 UNIT/GM OP OINT: 1.0000 "application " | TOPICAL_OINTMENT | Freq: Four times a day (QID) | OPHTHALMIC | Status: AC

## 2012-07-07 MED ORDER — KETOROLAC TROMETHAMINE 0.5 % OP SOLN: 1.0000 [drp] | Freq: Four times a day (QID) | OPHTHALMIC | Status: AC

## 2012-07-07 NOTE — Progress Notes (Signed)
Pt stated husband administered eye drop from home to RIGHT eye. Re-enforced to patient that home meds cannot be taken in the hospital per policy. RN removed home meds from room, labeled as home meds and placed in pt med drawer to be returned upon discharge.

## 2012-07-07 NOTE — Op Note (Signed)
NAMEALEXIE, Amanda West                 ACCOUNT NO.:  [id number]  MEDICAL RECORD NO.:  [id number]  LOCATION:  6N08C                        FACILITY:  [facility]  PHYSICIAN:  Beulah Gandy. Ashley Royalty, M.D. DATE OF BIRTH:  Mar 15, 1946  DATE OF PROCEDURE:  07/06/2012 DATE OF DISCHARGE:                              OPERATIVE REPORT   ADMISSION DIAGNOSIS:  Dislocated intraocular lens into the vitreous, left eye.  PROCEDURES:  Pars plana vitrectomy, removal of intraocular lens from vitreous placement of secondary intraocular lens, capsulectomy, gas- fluid exchange, retinal photocoagulation, all in the left eye.  SURGEON:  Alan Mulder, M.D.  ASSISTANT:  Rosalie Doctor, SA.  ANESTHESIA:  General.  DETAILS:  Usual prep and drape, conjunctival peritomy from 8 o'clock around to 4 o'clock.  Half thickness scleral flaps raised at 3 and 9 o'clock in anticipation of IOL suture.  A 25-gauge trocars were placed at 10, 2, and 4 o'clock with infusion at 4 o'clock.  A three-layer corneal scleral wound was created between 10 o'clock and 2 o'clock. Pars plana vitrectomy was performed with removal of vitreous from around the intraocular lens.  The lens was then passed into the anterior chamber and out through the corneoscleral wound.  The lens was sent for pathologic examination.  Additional vitrectomy was carried out.  At this point removing all anterior vitreous, there was no vitreous to the wound.  Once the anterior and posterior vitrectomy was completed, two 10- 0 Prolene sutures were passed beneath the scleral flaps from 3 o'clock limbus to the 9 o'clock limbus.  The sutures were externalized through the corneoscleral wound.  A new intraocular lens was made by [employer]  Model [id number], power 22.0 D, length 12.5 mm, optic 7.0 mm, serial #[id number] was brought onto the field inspected and cleaned.  The Prolene sutures were attached to the eyelets of the lens. The lens was passed into the anterior  chamber and into the posterior chamber.  It was dialed into place.  The Prolene sutures were drawn securely beneath the scleral flaps.  They were knotted and the free ends removed.  The scleral flaps were allowed to lie over the Prolene sutures.  The corneal wound was then closed with 5 interrupted 10-0 nylon sutures.  The wound was tested and found to be secured. Additional vitrectomy was carried out.  At this time with vitrectomy down to the macular surface, some pigment granules were seen and vacuumed from the surface of the retina.  The vitrectomy was carried into the mid periphery where pigmented vitreous was encountered.  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  The indirect ophthalmoscope laser was moved into place, 1364 burns were placed around the retinal periphery and around the retinoschisis.  The power was between 400 and 500 mW, 1000 microns each and 0.1 seconds each.  Additional vitrectomy was then carried out with all vitreous being removed down to the vitreous base and 30% gas-fluid exchange was performed.  The instruments were removed from the eye.  The trocars were removed.  The wounds were tested and found to be secured.  The conjunctiva was closed with 7-0 chromic suture.  Polymyxin and gentamicin were  irrigated into Tenon space.  No atropine was used.  Marcaine was injected around the globe for postop pain.  Decadron 10 mg was injected to the lower subconjunctival space.  Closing pressure was 10 with a [place] tonometer. Complications none.  Duration 1-1/2 hours.  Polysporin ophthalmic ointment, a patch and shield were placed.  The patient was taken to recovery in satisfactory condition.     Beulah Gandy. Ashley Royalty, M.D.     JDM/MEDQ  D:  07/06/2012  T:  07/07/2012  Job:  [id number]

## 2012-07-07 NOTE — Progress Notes (Signed)
DC home with husband. Verbally understands DC instructions. No questions asked.

## 2012-07-07 NOTE — Discharge Summary (Signed)
Discharge summary not needed on [facility] patients per medical records. 

## 2012-07-07 NOTE — Progress Notes (Signed)
RN was told by pt that she had taken 1000 mg of B12 this PM. RN explained to pt not to take any more medications from home per hospital policy.

## 2012-07-07 NOTE — Progress Notes (Signed)
07/07/2012, 6:47 AM  Mental Status:  Awake, Alert, Oriented  Anterior segment: Cornea  Clear    Anterior Chamber Clear    Lens:    IOL in place  Intra Ocular Pressure 16 mmHg with Tonopen  Vitreous: Clear 30%gas bubble   Retina:  Attached Good laser reaction   Impression: Excellent result Retina attached   Final Diagnosis: Principal Problem:  *Dislocated IOL (intraocular lens), posterior   Plan: start post operative eye drops.  Discharge to home.  Give post operative instructions  Sherrie George 07/07/2012, 6:47 AM

## 2012-07-09 NOTE — Discharge Summary (Signed)
Discharge summary not needed on [facility] patients per medical records. This patient was OUTPATIENT with [facility].  Not an admission.

## 2012-07-14 ENCOUNTER — Inpatient Hospital Stay (INDEPENDENT_AMBULATORY_CARE_PROVIDER_SITE_OTHER): Payer: Medicare Other | Admitting: Ophthalmology

## 2012-07-14 DIAGNOSIS — H2700 Aphakia, unspecified eye: Secondary | ICD-10-CM

## 2012-07-14 DIAGNOSIS — S058X9A Other injuries of unspecified eye and orbit, initial encounter: Secondary | ICD-10-CM

## 2012-07-15 ENCOUNTER — Encounter (INDEPENDENT_AMBULATORY_CARE_PROVIDER_SITE_OTHER): Payer: Medicare Other | Admitting: Ophthalmology

## 2012-07-15 DIAGNOSIS — H2700 Aphakia, unspecified eye: Secondary | ICD-10-CM

## 2012-07-15 DIAGNOSIS — S058X9A Other injuries of unspecified eye and orbit, initial encounter: Secondary | ICD-10-CM

## 2012-08-04 ENCOUNTER — Encounter (INDEPENDENT_AMBULATORY_CARE_PROVIDER_SITE_OTHER): Payer: Medicare Other | Admitting: Ophthalmology

## 2012-08-04 DIAGNOSIS — H2700 Aphakia, unspecified eye: Secondary | ICD-10-CM

## 2012-08-04 DIAGNOSIS — H35359 Cystoid macular degeneration, unspecified eye: Secondary | ICD-10-CM

## 2012-09-24 ENCOUNTER — Other Ambulatory Visit (HOSPITAL_COMMUNITY): Payer: Self-pay | Admitting: Family Medicine

## 2012-09-24 DIAGNOSIS — Z1231 Encounter for screening mammogram for malignant neoplasm of breast: Secondary | ICD-10-CM

## 2012-10-08 ENCOUNTER — Ambulatory Visit (HOSPITAL_COMMUNITY): Payer: Medicare Other

## 2012-10-20 ENCOUNTER — Ambulatory Visit (HOSPITAL_COMMUNITY)
Admission: RE | Admit: 2012-10-20 | Discharge: 2012-10-20 | Disposition: A | Discharge status: Home / Self Care | Payer: Medicare Other | Source: Ambulatory Visit | Attending: Family Medicine | Admitting: Family Medicine

## 2012-10-20 DIAGNOSIS — Z1231 Encounter for screening mammogram for malignant neoplasm of breast: Secondary | ICD-10-CM

## 2012-11-08 ENCOUNTER — Encounter (INDEPENDENT_AMBULATORY_CARE_PROVIDER_SITE_OTHER): Payer: Medicare Other | Admitting: Ophthalmology

## 2012-11-08 DIAGNOSIS — H35379 Puckering of macula, unspecified eye: Secondary | ICD-10-CM

## 2012-11-08 DIAGNOSIS — H43819 Vitreous degeneration, unspecified eye: Secondary | ICD-10-CM

## 2012-11-08 DIAGNOSIS — H35359 Cystoid macular degeneration, unspecified eye: Secondary | ICD-10-CM

## 2012-12-20 ENCOUNTER — Other Ambulatory Visit: Payer: Self-pay | Admitting: Family Medicine

## 2012-12-20 ENCOUNTER — Other Ambulatory Visit (HOSPITAL_COMMUNITY)
Admission: RE | Admit: 2012-12-20 | Discharge: 2012-12-20 | Disposition: A | Discharge status: Home / Self Care | Payer: Medicare Other | Source: Ambulatory Visit | Attending: Family Medicine | Admitting: Family Medicine

## 2012-12-20 DIAGNOSIS — Z124 Encounter for screening for malignant neoplasm of cervix: Secondary | ICD-10-CM | POA: Insufficient documentation

## 2013-03-09 ENCOUNTER — Ambulatory Visit (INDEPENDENT_AMBULATORY_CARE_PROVIDER_SITE_OTHER): Payer: Medicare Other | Admitting: Ophthalmology

## 2013-03-09 DIAGNOSIS — H35379 Puckering of macula, unspecified eye: Secondary | ICD-10-CM

## 2013-03-09 DIAGNOSIS — H35359 Cystoid macular degeneration, unspecified eye: Secondary | ICD-10-CM

## 2013-06-29 ENCOUNTER — Emergency Department (HOSPITAL_COMMUNITY)
Admission: EM | Admit: 2013-06-29 | Discharge: 2013-06-30 | Disposition: A | Discharge status: Home / Self Care | Payer: Medicare Other | Attending: Emergency Medicine | Admitting: Emergency Medicine

## 2013-06-29 ENCOUNTER — Encounter (HOSPITAL_COMMUNITY): Payer: Self-pay | Admitting: Emergency Medicine

## 2013-06-29 ENCOUNTER — Emergency Department (HOSPITAL_COMMUNITY): Payer: Medicare Other

## 2013-06-29 DIAGNOSIS — R4789 Other speech disturbances: Secondary | ICD-10-CM | POA: Insufficient documentation

## 2013-06-29 DIAGNOSIS — M6281 Muscle weakness (generalized): Secondary | ICD-10-CM | POA: Insufficient documentation

## 2013-06-29 DIAGNOSIS — Z87891 Personal history of nicotine dependence: Secondary | ICD-10-CM | POA: Insufficient documentation

## 2013-06-29 DIAGNOSIS — R5381 Other malaise: Secondary | ICD-10-CM | POA: Insufficient documentation

## 2013-06-29 DIAGNOSIS — R29898 Other symptoms and signs involving the musculoskeletal system: Secondary | ICD-10-CM

## 2013-06-29 DIAGNOSIS — R5383 Other fatigue: Secondary | ICD-10-CM | POA: Insufficient documentation

## 2013-06-29 DIAGNOSIS — Z8719 Personal history of other diseases of the digestive system: Secondary | ICD-10-CM | POA: Insufficient documentation

## 2013-06-29 DIAGNOSIS — H539 Unspecified visual disturbance: Secondary | ICD-10-CM | POA: Insufficient documentation

## 2013-06-29 DIAGNOSIS — Z7982 Long term (current) use of aspirin: Secondary | ICD-10-CM | POA: Insufficient documentation

## 2013-06-29 LAB — URINE MICROSCOPIC-ADD ON

## 2013-06-29 LAB — URINALYSIS, ROUTINE W REFLEX MICROSCOPIC
Bilirubin Urine: NEGATIVE
Glucose, UA: NEGATIVE mg/dL
Hgb urine dipstick: NEGATIVE
Protein, ur: NEGATIVE mg/dL
Urobilinogen, UA: 0.2 mg/dL (ref 0.0–1.0)

## 2013-06-29 LAB — POCT I-STAT, CHEM 8
Chloride: 103 mEq/L (ref 96–112)
Creatinine, Ser: 1 mg/dL (ref 0.50–1.10)
Glucose, Bld: 87 mg/dL (ref 70–99)
HCT: 34 % — ABNORMAL LOW (ref 36.0–46.0)
Hemoglobin: 11.6 g/dL — ABNORMAL LOW (ref 12.0–15.0)
Potassium: 3.6 mEq/L (ref 3.5–5.1)
Sodium: 142 mEq/L (ref 135–145)

## 2013-06-29 LAB — COMPREHENSIVE METABOLIC PANEL
AST: 41 U/L — ABNORMAL HIGH (ref 0–37)
Albumin: 3.5 g/dL (ref 3.5–5.2)
Calcium: 9.3 mg/dL (ref 8.4–10.5)
Chloride: 101 mEq/L (ref 96–112)
Creatinine, Ser: 0.82 mg/dL (ref 0.50–1.10)
Total Protein: 7.5 g/dL (ref 6.0–8.3)

## 2013-06-29 LAB — APTT: aPTT: 28 seconds (ref 24–37)

## 2013-06-29 LAB — CBC
HCT: 31.2 % — ABNORMAL LOW (ref 36.0–46.0)
MCHC: 34.6 g/dL (ref 30.0–36.0)
RDW: 11.9 % (ref 11.5–15.5)

## 2013-06-29 LAB — ETHANOL: Alcohol, Ethyl (B): <11 mg/dL (ref 0–11)

## 2013-06-29 LAB — POCT I-STAT TROPONIN I: Troponin i, poc: 0.01 ng/mL (ref 0.00–0.08)

## 2013-06-29 LAB — DIFFERENTIAL
Basophils Absolute: 0 10*3/uL (ref 0.0–0.1)
Basophils Relative: 1 % (ref 0–1)
Eosinophils Absolute: 0.2 10*3/uL (ref 0.0–0.7)
Monocytes Absolute: 0.3 10*3/uL (ref 0.1–1.0)
Neutro Abs: 1.8 10*3/uL (ref 1.7–7.7)

## 2013-06-29 LAB — PROTIME-INR: INR: 1.02 (ref 0.00–1.49)

## 2013-06-29 NOTE — ED Notes (Signed)
Pt brought in by EMS. Pt reports feeling heaviness in bilateral arms around 1900, which progressively got worse. Pt states she felt like she was going to pass out and reported seeing "black spots", so she laid down on the ground. Upon EMS arrival, pt states that she was having slurred speech. EMS reports initial bp while lying down was 150/72, and while sitting her bp was 124 palpated. BP upon arrival to the department was 125/76. No LOC or fall. Pt denies dizziness.

## 2013-06-29 NOTE — ED Provider Notes (Signed)
History    CSN: [id number] Arrival date & time 06/29/13  2005  None    Chief Complaint  Patient presents with  . Near Syncope  . Extremity Weakness   (Consider location/radiation/quality/duration/timing/severity/associated sxs/prior Treatment) Patient is a 67 y.o. female presenting with weakness. The history is provided by the patient. No language interpreter was used.  Weakness This is a recurrent problem. The current episode started 1 to 2 hours ago (Pt is a 67 yo woman who has had intermittent episodes of weakness in her arms, on and off for a month or so.  tonigh around 7 P.M. She had worse heaviness and weakness 7 A.M.  She felt as though she might pass out.  She saw black spots in the eyes.   ). The problem occurs constantly. The problem has been gradually worsening. Pertinent negatives include no chest pain, no abdominal pain, no headaches and no shortness of breath. Nothing aggravates the symptoms. Nothing relieves the symptoms. She has tried nothing for the symptoms. The treatment provided no relief.   Past Medical History  Diagnosis Date  . H/O hiatal hernia    Past Surgical History  Procedure Laterality Date  . Hertial hernia    . Pars plana vitrectomy  07/06/2012    Procedure: PARS PLANA VITRECTOMY WITH 25G REMOVAL/SUTURE INTRAOCULAR LENS;  Surgeon: Sherrie George, MD;  Location: [facility] OR;  Service: Ophthalmology;  Laterality: Left;   History reviewed. No pertinent family history. History  Substance Use Topics  . Smoking status: Former Games developer  . Smokeless tobacco: Former Neurosurgeon    Quit date: 11/14/2009  . Alcohol Use: No   OB History   Grav Para Term Preterm Abortions TAB SAB Ect Mult Living                 Review of Systems  Constitutional: Negative for fever and chills.  HENT: Negative.   Eyes: Positive for visual disturbance.  Respiratory: Negative.  Negative for shortness of breath.   Cardiovascular: Negative for chest pain.  Gastrointestinal: Negative.   Negative for abdominal pain.  Genitourinary: Negative.   Musculoskeletal: Negative.   Skin: Negative.   Neurological: Positive for speech difficulty and weakness. Negative for headaches.  Psychiatric/Behavioral: Negative.     Allergies  Codeine  Home Medications   Current Outpatient Rx  Name  Route  Sig  Dispense  Refill  . aspirin 81 MG chewable tablet   Oral   Chew 81 mg by mouth daily.         Marland Kitchen CALCIUM-MAGNESIUM-ZINC PO   Oral   Take 1 tablet by mouth 2 (two) times daily.         Marland Kitchen CHERRY CONCENTRATE PO   Oral   Take 1,000 mg by mouth daily.         . Chromium Aspartate 1000 MCG TABS   Oral   Take 1,000 mcg by mouth daily.         . fish oil-omega-3 fatty acids 1000 MG capsule   Oral   Take 1 g by mouth 2 (two) times daily.          . folic acid (FOLVITE) 400 MCG tablet   Oral   Take 400 mcg by mouth daily.         . Methylcobalamin (B-12) 5000 MCG TBDP   Oral   Take 5,000 mcg by mouth daily.         . phenylephrine (SUDAFED PE) 10 MG TABS   Oral  Take 10 mg by mouth daily as needed (for ear).          . Potassium 99 MG TABS   Oral   Take 99 mg by mouth daily.         . Pyridoxine HCl (VITAMIN B-6 CR) 200 MG TBCR   Oral   Take 1 tablet by mouth daily.         . vitamin C (ASCORBIC ACID) 500 MG tablet   Oral   Take 500 mg by mouth daily.          BP 129/62  Temp(Src) 98.1 F (36.7 C) (Oral)  Resp 26  Ht 5\' 3"  (1.6 m)  Wt 120 lb (54.432 kg)  BMI 21.26 kg/m2  SpO2 97% Physical Exam  Nursing note and vitals reviewed. Constitutional: She is oriented to person, place, and time. She appears well-developed and well-nourished. No distress.  HENT:  Head: Normocephalic and atraumatic.  Right Ear: External ear normal.  Left Ear: External ear normal.  Mouth/Throat: Oropharynx is clear and moist.  Eyes: Conjunctivae and EOM are normal. Pupils are equal, round, and reactive to light.  Neck: Normal range of motion. Neck supple.   No carotid bruit.   Cardiovascular: Normal rate, regular rhythm and normal heart sounds.   Pulmonary/Chest: Effort normal and breath sounds normal.  Abdominal: Soft. Bowel sounds are normal.  Musculoskeletal: Normal range of motion. She exhibits no edema and no tenderness.  Lymphadenopathy:    She has no cervical adenopathy.  Neurological: She is alert and oriented to person, place, and time.  Cranial nerves intact.  Sensory and motor function intact in arms and legs.    Skin: Skin is warm and dry.  Psychiatric: She has a normal mood and affect. Her behavior is normal.    ED Course  Procedures (including critical care time) Labs Reviewed  URINALYSIS, ROUTINE W REFLEX MICROSCOPIC - Abnormal; Notable for the following:    Leukocytes, UA SMALL (*)    All other components within normal limits  URINE CULTURE  GLUCOSE, CAPILLARY  URINE MICROSCOPIC-ADD ON  ETHANOL  PROTIME-INR  APTT  CBC  DIFFERENTIAL  COMPREHENSIVE METABOLIC PANEL  TROPONIN I  URINE RAPID DRUG SCREEN (HOSP PERFORMED)    10:08 PM  Date: 06/29/2013  Rate: 66  Rhythm: normal sinus rhythm  QRS Axis: normal  Intervals: normal  ST/T Wave abnormalities: Nonspecific T wave changes.  Conduction Disutrbances:none  Narrative Interpretation: Abnormal EKG  Old EKG Reviewed: unchanged  11:24 PM Results for orders placed during the hospital encounter of 06/29/13  ETHANOL      Result Value Range   Alcohol, Ethyl (B) <11  0 - 11 mg/dL  PROTIME-INR      Result Value Range   Prothrombin Time 13.2  11.6 - 15.2 seconds   INR 1.02  0.00 - 1.49  APTT      Result Value Range   aPTT 28  24 - 37 seconds  CBC      Result Value Range   WBC 3.9 (*) 4.0 - 10.5 K/uL   RBC 3.19 (*) 3.87 - 5.11 MIL/uL   Hemoglobin 10.8 (*) 12.0 - 15.0 g/dL   HCT 16.1 (*) 09.6 - 04.5 %   MCV 97.8  78.0 - 100.0 fL   MCH 33.9  26.0 - 34.0 pg   MCHC 34.6  30.0 - 36.0 g/dL   RDW 40.9  81.1 - 91.4 %   Platelets 175  150 - 400 K/uL   DIFFERENTIAL  Result Value Range   Neutrophils Relative % 47  43 - 77 %   Neutro Abs 1.8  1.7 - 7.7 K/uL   Lymphocytes Relative 40  12 - 46 %   Lymphs Abs 1.6  0.7 - 4.0 K/uL   Monocytes Relative 9  3 - 12 %   Monocytes Absolute 0.3  0.1 - 1.0 K/uL   Eosinophils Relative 4  0 - 5 %   Eosinophils Absolute 0.2  0.0 - 0.7 K/uL   Basophils Relative 1  0 - 1 %   Basophils Absolute 0.0  0.0 - 0.1 K/uL  COMPREHENSIVE METABOLIC PANEL      Result Value Range   Sodium 137  135 - 145 mEq/L   Potassium 3.6  3.5 - 5.1 mEq/L   Chloride 101  96 - 112 mEq/L   CO2 28  19 - 32 mEq/L   Glucose, Bld 91  70 - 99 mg/dL   BUN 12  6 - 23 mg/dL   Creatinine, Ser 4.09  0.50 - 1.10 mg/dL   Calcium 9.3  8.4 - 81.1 mg/dL   Total Protein 7.5  6.0 - 8.3 g/dL   Albumin 3.5  3.5 - 5.2 g/dL   AST 41 (*) 0 - 37 U/L   ALT 20  0 - 35 U/L   Alkaline Phosphatase 67  39 - 117 U/L   Total Bilirubin 0.2 (*) 0.3 - 1.2 mg/dL   GFR calc non Af Amer 72 (*) >90 mL/min   GFR calc Af Amer 84 (*) >90 mL/min  TROPONIN I      Result Value Range   Troponin I <0.30  <0.30 ng/mL  URINALYSIS, ROUTINE W REFLEX MICROSCOPIC      Result Value Range   Color, Urine YELLOW  YELLOW   APPearance CLEAR  CLEAR   Specific Gravity, Urine 1.007  1.005 - 1.030   pH 7.0  5.0 - 8.0   Glucose, UA NEGATIVE  NEGATIVE mg/dL   Hgb urine dipstick NEGATIVE  NEGATIVE   Bilirubin Urine NEGATIVE  NEGATIVE   Ketones, ur NEGATIVE  NEGATIVE mg/dL   Protein, ur NEGATIVE  NEGATIVE mg/dL   Urobilinogen, UA 0.2  0.0 - 1.0 mg/dL   Nitrite NEGATIVE  NEGATIVE   Leukocytes, UA SMALL (*) NEGATIVE  GLUCOSE, CAPILLARY      Result Value Range   Glucose-Capillary 91  70 - 99 mg/dL   Comment 1 Documented in Chart     Comment 2 Notify RN    URINE MICROSCOPIC-ADD ON      Result Value Range   Squamous Epithelial / LPF RARE  RARE   WBC, UA 3-6  <3 WBC/hpf   Bacteria, UA RARE  RARE  POCT I-STAT TROPONIN I      Result Value Range   Troponin i, poc 0.01   0.00 - 0.08 ng/mL   Comment 3           POCT I-STAT, CHEM 8      Result Value Range   Sodium 142  135 - 145 mEq/L   Potassium 3.6  3.5 - 5.1 mEq/L   Chloride 103  96 - 112 mEq/L   BUN 12  6 - 23 mg/dL   Creatinine, Ser 9.14  0.50 - 1.10 mg/dL   Glucose, Bld 87  70 - 99 mg/dL   Calcium, Ion 7.82  9.56 - 1.30 mmol/L   TCO2 27  0 - 100 mmol/L   Hemoglobin 11.6 (*) 12.0 -  15.0 g/dL   HCT 16.1 (*) 09.6 - 04.5 %   Ct Head Wo Contrast  06/29/2013   *RADIOLOGY REPORT*  Clinical Data: Near-syncope, extremity weakness  CT HEAD WITHOUT CONTRAST  Technique:  Contiguous axial images were obtained from the base of the skull through the vertex without contrast.  Comparison: None.  Findings: There is no midline shift, hydrocephalus, or mass effect. No acute hemorrhage or acute transcortical infarct is identified. Bilateral basal ganglia calcifications are identified.  There is mild chronic diffuse atrophy.  The bony calvarium is intact.  The visualized sinuses are clear.  IMPRESSION: No focal acute intracranial abnormality identified.  Chronic diffuse atrophy.   Original Report Authenticated By: Sherian Rein, M.D.   11:31 PM Course in ED:  Pt seen --> physical exam performed.  Lab workup did not show any major abnormality.  Call to Neurology -->   12:39 AM Pt was seen by Ritta Slot, M.D., who did not think pt had had a stroke. He advised that she would need MR of the cervical spine as an outpatient.     1. Bilateral arm weakness         Carleene Cooper III, MD 06/30/13 0040

## 2013-06-29 NOTE — ED Notes (Signed)
Ignacia Palma, MD at bedside.

## 2013-06-30 DIAGNOSIS — R29898 Other symptoms and signs involving the musculoskeletal system: Secondary | ICD-10-CM

## 2013-06-30 LAB — URINE CULTURE

## 2013-06-30 LAB — RAPID URINE DRUG SCREEN, HOSP PERFORMED
Amphetamines: NOT DETECTED
Benzodiazepines: NOT DETECTED
Cocaine: NOT DETECTED
Opiates: NOT DETECTED
Tetrahydrocannabinol: NOT DETECTED

## 2013-06-30 NOTE — ED Notes (Signed)
Neurology MD at bedside

## 2013-06-30 NOTE — Consult Note (Signed)
Neurology Consultation Reason for Consult: Arm weakness Referring Physician: Ignacia Palma, A  CC: Arm weakness  History is obtained from:Patient, husband.   HPI: Amanda West is a 67 y.o. female who has had several episodes associated with arm weakness and numbness that have been occurring over the past month. Tonight, she had her most severe one that prompted her to come be evaluated in the emergency room. She describes taht she got lightheaded and laid down and following this, her arms hurt and felt numb/heavy. She states that she could always lift them, but they felt heavy. She has become woozy a couple of times at work as well and had her arms feel weak then also. She feels that the pain came first, then the lightheadedness.    ROS: A 14 point ROS was performed and is negative except as noted in the HPI.  Past Medical History  Diagnosis Date  . H/O hiatal hernia    Family history: No hx similar  Social History: Tob: former smoker  Exam: Current vital signs: BP 110/59  Temp(Src) 98.1 F (36.7 C) (Oral)  Resp 14  Ht 5\' 3"  (1.6 m)  Wt 54.432 kg (120 lb)  BMI 21.26 kg/m2  SpO2 99% Vital signs in last 24 hours: Temp:  [98.1 F (36.7 C)-98.6 F (37 C)] 98.1 F (36.7 C) (07/23 2150) Resp:  [14-26] 14 (07/24 0007) BP: (110-129)/(59-62) 110/59 mmHg (07/24 0007) SpO2:  [97 %-99 %] 99 % (07/24 0007) Weight:  [54.432 kg (120 lb)] 54.432 kg (120 lb) (07/23 2030)  General: in bed, nad CV: RRR Mental Status: Patient is awake, alert, oriented to person, place, month, year, and situation. Immediate and remote memory are intact. Patient is able to give a clear and coherent history. No signs of aphasia or neglect.  Cranial Nerves: II: Visual Fields are full. Pupils are equal, round, and reactive to light.  Discs are difficult to visualize. III,IV, VI: EOMI without ptosis or diploplia.  V: Facial sensation is symmetric to temperature VII: Facial movement is symmetric.  VIII: hearing  is intact to voice X: Uvula elevates symmetrically XI: Shoulder shrug is symmetric. XII: tongue is midline without atrophy or fasciculations.  Motor: Tone is normal. Bulk is normal. 5/5 strength was present in all four extremities.  Sensory: Sensation is symmetric to light touch and temperature in the arms and legs. Deep Tendon Reflexes: 2+ and symmetric in the biceps and patellae.  Plantars: Toes are downgoing bilaterally.  Cerebellar: FNF and HKS are intact bilaterally Gait: Not assessed due to  multiple medical monitors in ED setting.  I have reviewed labs in epic and the results pertinent to this consultation are: Chem 8 - unremarkable.   I have reviewed the images obtained:CT head - unremarkable  Impression: 67 yo F with transient bilateral arm weakness and numbness. The bilateral symmetric nature would argue against TIA as a cuase for her symptoms. I do wonder about C-Spine disease that is positional in nature and feel that further imaging with MRI would be prudent.   Recommendations: 1) MRI C-Spine without contrast, if could be arranged relatively quickly then this could be pursued as an outpatient.  2) If stenosis is present, then evaluation by neurosurgery would be indicated.  3) If no cause on C-Spine MRI, then could be referred to outpatient neurology.    Ritta Slot, MD [facility] [phone]  If 7pm- 7am, please page neurology on call at [phone].

## 2013-07-07 ENCOUNTER — Ambulatory Visit (HOSPITAL_COMMUNITY)
Admission: RE | Admit: 2013-07-07 | Discharge: 2013-07-07 | Disposition: A | Discharge status: Home / Self Care | Payer: Medicare Other | Source: Ambulatory Visit | Attending: Emergency Medicine | Admitting: Emergency Medicine

## 2013-07-07 DIAGNOSIS — M47812 Spondylosis without myelopathy or radiculopathy, cervical region: Secondary | ICD-10-CM | POA: Insufficient documentation

## 2013-07-19 ENCOUNTER — Telehealth: Payer: Self-pay | Admitting: Hematology & Oncology

## 2013-07-19 NOTE — Telephone Encounter (Signed)
Pt aware of 8-20 appointment °

## 2013-07-22 ENCOUNTER — Encounter: Payer: Self-pay | Admitting: Neurology

## 2013-07-22 ENCOUNTER — Encounter: Payer: Self-pay | Admitting: Hematology & Oncology

## 2013-07-22 ENCOUNTER — Ambulatory Visit (INDEPENDENT_AMBULATORY_CARE_PROVIDER_SITE_OTHER): Payer: Medicare Other | Admitting: Neurology

## 2013-07-22 VITALS — BP 113/69 | HR 70 | Ht 62.0 in | Wt 126.0 lb

## 2013-07-22 DIAGNOSIS — R269 Unspecified abnormalities of gait and mobility: Secondary | ICD-10-CM

## 2013-07-22 HISTORY — DX: Unspecified abnormalities of gait and mobility: R26.9

## 2013-07-22 NOTE — Progress Notes (Signed)
Reason for visit: Gait disorder  Amanda West is a 67 y.o. female  History of present illness:  Amanda West is a 67 year old right-handed white female with a history of some issues with walking and balance over the last 1-2 years. The patient indicates that she was an avid runner, but she began having difficulty getting her legs to do what she wanted them to do. The patient had a significant episode around 06/30/2013 when both arms became heavy. The patient went to the emergency room, and in the ambulance, she was noted to have some slurred speech that was transient in nature. The patient underwent a CT scan of the brain that was unremarkable. Eventually, the patient underwent a cervical spine MRI study that shows multilevel spinal stenosis with some impingement of the spinal cord, without spinal cord injury. The patient was seen by neurosurgery, and surgery was not recommended at this time. The patient indicates that she has gone on to have several episodes of arm heaviness bilaterally, with mild slurred speech. The patient denies any overt headache, visual loss, double vision, or numbness of the face, or of the body or extremities. The patient indicates that over the last week or so, the walking has changed significantly, and she is quite unsteady with walking. The patient is sent to this office for an evaluation.  Past Medical History  Diagnosis Date  . H/O hiatal hernia   . Anemia   . Shingles   . Osteoporosis   . Abnormality of gait 07/22/2013  . Vitamin D deficiency     Past Surgical History  Procedure Laterality Date  . Hertial hernia    . Pars plana vitrectomy  07/06/2012    Procedure: PARS PLANA VITRECTOMY WITH 25G REMOVAL/SUTURE INTRAOCULAR LENS;  Surgeon: Sherrie George, MD;  Location: [facility];  Service: Ophthalmology;  Laterality: Left;  . Cataract extraction      Family History  Problem Relation Age of Onset  . Heart disease Mother   . Heart disease Father   . Diabetes Sister    . Cancer Brother   . Heart disease Brother     Social history:  reports that she has quit smoking. She quit smokeless tobacco use about 3 years ago. She reports that she does not drink alcohol or use illicit drugs.  Medications:  Current Outpatient Prescriptions on File Prior to Visit  Medication Sig Dispense Refill  . aspirin 81 MG chewable tablet Chew 81 mg by mouth daily.      Marland Kitchen CALCIUM-MAGNESIUM-ZINC PO Take 1 tablet by mouth 2 (two) times daily.      Marland Kitchen CHERRY CONCENTRATE PO Take 1,000 mg by mouth daily.      . Chromium Aspartate 1000 MCG TABS Take 1,000 mcg by mouth daily.      . fish oil-omega-3 fatty acids 1000 MG capsule Take 1 g by mouth 2 (two) times daily.       . folic acid (FOLVITE) 400 MCG tablet Take 400 mcg by mouth daily.      . Methylcobalamin (B-12) 5000 MCG TBDP Take 5,000 mcg by mouth daily.      . phenylephrine (SUDAFED PE) 10 MG TABS Take 10 mg by mouth daily as needed (for ear).       . Potassium 99 MG TABS Take 99 mg by mouth daily.      . Pyridoxine HCl (VITAMIN B-6 CR) 200 MG TBCR Take 1 tablet by mouth daily.      . vitamin C (ASCORBIC  ACID) 500 MG tablet Take 500 mg by mouth daily.       No current facility-administered medications on file prior to visit.    Allergies:  Allergies  Allergen Reactions  . Codeine Nausea Only    ROS:  Out of a complete 14 system review of symptoms, the patient complains only of the following symptoms, and all other reviewed systems are negative.   Fatigue Numbness, weakness, dizziness Decreased energy, restless legs  Blood pressure 113/69, pulse 70, height 5\' 2"  (1.575 m), weight 126 lb (57.153 kg).  Physical Exam  General: The patient is alert and cooperative at the time of the examination.  Head: Pupils are equal, round, and reactive to light. Discs are flat bilaterally.  Neck: The neck is supple, no carotid bruits are noted.  Respiratory: The respiratory examination is clear.  Cardiovascular: The  cardiovascular examination reveals a regular rate and rhythm, no obvious murmurs or rubs are noted.  Skin: Extremities are without significant edema.  Neurologic Exam  Mental status:  Cranial nerves: Facial symmetry is present. There is good sensation of the face to pinprick and soft touch bilaterally. The strength of the facial muscles and the muscles to head turning and shoulder shrug are normal bilaterally. Speech is well enunciated, no aphasia or dysarthria is noted. Extraocular movements are full. Visual fields are full.  Motor: The motor testing reveals 5 over 5 strength of all 4 extremities. Good symmetric motor tone is noted throughout.  Sensory: Sensory testing is intact to pinprick, soft touch, vibration sensation, and position sense on all 4 extremities, with the exception that position sensation is decreased on the left foot. No evidence of extinction is noted.  Coordination: Cerebellar testing reveals good finger-nose-finger and heel-to-shin bilaterally.  Gait and station: Gait is slightly unsteady. Tandem gait is unsteady. Romberg is negative. No drift is seen.  Reflexes: Deep tendon reflexes are symmetric and normal bilaterally, but the reflexes are relatively increased in the legs as compared to the arms. Toes are equivocal bilaterally.     MRI cervical spine report : 06/30/2013  IMPRESSION:  1. There is multilevel spondylosis superimposed on a congenitally  small spinal canal. There is moderate resulting central stenosis  from C4-C5 through C6-C7. Mild cord flattening is present at the  C4-C5 and C5-C6 levels.  2. Mild to moderate foraminal narrowing is present at multiple  levels as detailed above, greatest at C5-C6 and C6-C7.  3. No abnormal cord signal identified.    Assessment/Plan:   1. Gait disorder  2. Episodic arm heaviness and slurred speech  3. Multilevel cervical spinal stenosis  The patient clearly has significant spinal stenosis at several  levels, but the patient reports episodic arm heaviness that is symmetric, unassociated with discomfort. The patient also has slurred speech with these events. The patient will need an evaluation for possible vertebrobasilar insufficiency, or a subclavian steal syndrome. The patient will be set up for MRI evaluation of the brain, and MRA of the head. The patient will followup in 6-8 weeks. The patient is on low-dose aspirin at this time. The vitamin B12 level appears to have been drawn, the results are not available to me.  Marlan Palau MD 07/24/2013 2:06 PM  [facility] [address] [place], [place] [address]  Phone [phone] Fax [phone]

## 2013-07-27 ENCOUNTER — Ambulatory Visit: Payer: Medicare Other

## 2013-07-27 ENCOUNTER — Ambulatory Visit (HOSPITAL_BASED_OUTPATIENT_CLINIC_OR_DEPARTMENT_OTHER): Payer: Medicare Other | Admitting: Hematology & Oncology

## 2013-07-27 ENCOUNTER — Ambulatory Visit (HOSPITAL_BASED_OUTPATIENT_CLINIC_OR_DEPARTMENT_OTHER): Payer: Medicare Other | Admitting: Lab

## 2013-07-27 VITALS — BP 121/57 | HR 52 | Temp 97.7°F | Resp 16 | Ht 62.0 in | Wt 124.0 lb

## 2013-07-27 DIAGNOSIS — M4802 Spinal stenosis, cervical region: Secondary | ICD-10-CM

## 2013-07-27 DIAGNOSIS — D6489 Other specified anemias: Secondary | ICD-10-CM

## 2013-07-27 DIAGNOSIS — D638 Anemia in other chronic diseases classified elsewhere: Secondary | ICD-10-CM

## 2013-07-27 DIAGNOSIS — D539 Nutritional anemia, unspecified: Secondary | ICD-10-CM

## 2013-07-27 LAB — CBC WITH DIFFERENTIAL (CANCER CENTER ONLY)
BASO%: 0.4 % (ref 0.0–2.0)
HCT: 34.6 % — ABNORMAL LOW (ref 34.8–46.6)
LYMPH%: 29.1 % (ref 14.0–48.0)
MCH: 34.7 pg — ABNORMAL HIGH (ref 26.0–34.0)
MCV: 100 fL (ref 81–101)
MONO#: 0.3 10*3/uL (ref 0.1–0.9)
MONO%: 7.2 % (ref 0.0–13.0)
NEUT%: 56.6 % (ref 39.6–80.0)
RDW: 11 % — ABNORMAL LOW (ref 11.1–15.7)
WBC: 4.8 10*3/uL (ref 3.9–10.0)

## 2013-07-27 LAB — IRON AND TIBC CHCC
TIBC: 291 ug/dL (ref 236–444)
UIBC: 207 ug/dL (ref 120–384)

## 2013-07-27 NOTE — Progress Notes (Signed)
This office note has been dictated.

## 2013-07-28 ENCOUNTER — Encounter (INDEPENDENT_AMBULATORY_CARE_PROVIDER_SITE_OTHER): Payer: Medicare Other | Admitting: Ophthalmology

## 2013-07-28 DIAGNOSIS — H35379 Puckering of macula, unspecified eye: Secondary | ICD-10-CM

## 2013-07-28 DIAGNOSIS — H35359 Cystoid macular degeneration, unspecified eye: Secondary | ICD-10-CM

## 2013-07-28 NOTE — Progress Notes (Signed)
DIAGNOSIS:  Macrocytic anemia-transient.  HISTORY OF PRESENT ILLNESS:  Mr. Amanda West is a very nice 67 year old white female.  She is followed by Dr. Lupita Raider.  Ms. Ziesmer seems to be having some neurological issues.  She has seen multiple doctors.  She had an MRI of the neck back in July.  This showed multiple disk issues. She has a congenitally small spinal canal.  She has spinal stenosis from C4 down to C7.  There is some cord flattening.  There is no obvious bony lesions.  A CT of the head was done on 07/23.  This was unremarkable. She has been noted to have some anemia.  Going back to March of 2012, CBC was done which showed a white count of 3, hemoglobin 11.1, hematocrit 32.2, platelet count 160.  In July 2014, a CBC was done which showed a white cell count of 3.9, hemoglobin 10.8, hematocrit 31.2, platelet count 175.  MCV was 97.  She had normal white cell distribution.  Electrolytes done back in July showed a BUN of 12, creatinine 0.82. Glucose was okay.  LFTs were normal.  She has had no obvious bleeding.  She was supposed to have a, I think, colonoscopy but this has been postponed because of some other issues.  She has not noted any melena or bright red blood per rectum.  She has had no weight loss or weight gain.  She has had no rashes. There has been no palpable lymph glands.  She has had no mouth sores. There has been no dysphagia or odynophagia.  There has been no double vision or blurred vision.  There has been, I think some syncope and near- syncope episodes.  PAST MEDICAL HISTORY:  Remarkable for osteoporosis.  ALLERGIES:  Codeine.  MEDICATIONS:  Valtrex 5 mg p.o. q.12 hours p.r.n., vitamin D daily, folic acid 400 mcg p.o. daily, vitamin B12 5 mg p.o. daily, pyridoxine 200 mg p.o. daily, vitamin C daily, iron 1 p.o. daily.  SOCIAL HISTORY:  Remarkable for past tobacco use.  She stopped several years ago.  I think there is no significant alcohol use.  She  has no obvious occupational exposures.  FAMILY HISTORY:  Negative for any obvious anemia.  I do not think there is any history of malignancy in the family.  REVIEW OF SYSTEMS:  As stated in the history of present illness.  PHYSICAL EXAMINATION:  General:  This is a petite white female in no obvious distress.  She is alert and oriented x3.  Vital signs: Temperature of 97.7, pulse 52, respiratory rate 16, blood pressure 121/57.  Weight is 124.  Head and neck:  Normocephalic, atraumatic skull.  There are no ocular or oral lesions.  There are no palpable cervical or supraclavicular lymph nodes.  Lungs:  Clear to percussion and auscultation bilaterally.  There are no rales, wheezes or rhonchi. Cardiac:  Regular rate and rhythm with a normal S1 and S2.  She has no murmurs, rubs or bruits.  Axillary:  Shows no bilateral axillary adenopathy.  Abdomen:  Soft.  She has good bowel sounds.  There is no fluid wave.  There is no palpable hepatosplenomegaly.  She does have a hiatal hernia surgical scar that is well healed.  Back exam shows no kyphosis.  There are no osteoporotic changes.  There is no tenderness over the spine, ribs, or hips.  Extremities:  Show no clubbing, cyanosis or edema.  She has good range of motion of her joints.  There is no joint  redness, warmth or tenderness.  Skin:  No rashes, ecchymoses or petechia.  Neurological: No focal neurological deficits.  LABORATORY STUDIES:  White cell count 4.8, hemoglobin 12, hematocrit 34.6, platelet count 186.  MCV is 100.  Her ferritin was 139 with an iron saturation of 29%.  Peripheral smear shows a normochromic, normocytic population of red blood cells.  I was not too impressed with any macrocytic changes.  She has no rouleaux formation.  There are no schistocytes.  There are no spherocytes.  There are no nucleated red blood cells.  I see no target cells.  White cells appear normal in morphology and maturation.  There is no  hypersegmented polys.  There is no immature myeloid or lymphoid forms.  There are no blasts.  Platelets are adequate in number and size.  IMPRESSION:  Ms. Bohannon is a very charming 67 year old white female with transient anemia.  She clearly is not anemic today.  I really cannot explain why she was anemic.  She certainly is on quite a few medications now.  These are all multivitamins.  I suppose he may have had a vitamin deficiency.  If so, this certainly is improved.  Again, her blood smear really looked benign.  I do not see any indication for doing a bone marrow test on her.  I do not see any indication for doing any x-rays.  I did send off some routine anemia studies. We will see what they show [place]. I cannot imagine her having any kind of hemolytic process. Hopefully, I do not have to get Ms. Griess back to clinic.  I did give her a prayer blanket, which I felt would help her out.  There are no neurological issues that need to be addressed. We will call Ms. Bramlett when I get the results back from her lab work.  I spent a good hour or so with Ms. Casselman and her husband.  They are both very, very nice.  They have a strong faith.    ______________________________ Josph Macho, M.D. PRE/MEDQ  D:  07/27/2013  T:  07/28/2013  Job:  [id number]

## 2013-07-29 LAB — COMPREHENSIVE METABOLIC PANEL
ALT: 18 U/L (ref 0–35)
AST: 31 U/L (ref 0–37)
Albumin: 4.1 g/dL (ref 3.5–5.2)
Calcium: 9.6 mg/dL (ref 8.4–10.5)
Chloride: 103 mEq/L (ref 96–112)
Creatinine, Ser: 0.78 mg/dL (ref 0.50–1.10)
Potassium: 3.9 mEq/L (ref 3.5–5.3)

## 2013-07-29 LAB — PROTEIN ELECTROPHORESIS, SERUM, WITH REFLEX
Albumin ELP: 54 % — ABNORMAL LOW (ref 55.8–66.1)
Alpha-1-Globulin: 3.6 % (ref 2.9–4.9)
Beta 2: 4.6 % (ref 3.2–6.5)
Total Protein, Serum Electrophoresis: 7.6 g/dL (ref 6.0–8.3)

## 2013-07-29 LAB — RETICULOCYTES (CHCC): RBC.: 3.53 MIL/uL — ABNORMAL LOW (ref 3.87–5.11)

## 2013-08-03 ENCOUNTER — Ambulatory Visit
Admission: RE | Admit: 2013-08-03 | Discharge: 2013-08-03 | Disposition: A | Discharge status: Home / Self Care | Payer: Medicare Other | Source: Ambulatory Visit | Attending: Neurology | Admitting: Neurology

## 2013-08-03 DIAGNOSIS — R269 Unspecified abnormalities of gait and mobility: Secondary | ICD-10-CM

## 2013-08-05 ENCOUNTER — Other Ambulatory Visit: Payer: Medicare Other

## 2013-08-05 ENCOUNTER — Telehealth: Payer: Self-pay | Admitting: Neurology

## 2013-08-05 MED ORDER — NORTRIPTYLINE HCL 10 MG PO CAPS: ORAL_CAPSULE | ORAL | Status: DC

## 2013-08-05 NOTE — Telephone Encounter (Signed)
I called patient. The patient is reporting a pressure sensation in the head and neck area. The patient has had MRI evaluation the brain shows mild small vessel disease, no acute changes. MRA of the head is normal. Etiology of her episodes of arm heaviness, pressure sensations in the body and head are unclear. The patient does have significant cervical spondylosis and spinal stenosis. I'll give a trial on nortriptyline to see if this helps her symptoms.

## 2013-09-21 ENCOUNTER — Ambulatory Visit (INDEPENDENT_AMBULATORY_CARE_PROVIDER_SITE_OTHER): Payer: Medicare Other | Admitting: Ophthalmology

## 2013-09-26 ENCOUNTER — Ambulatory Visit (INDEPENDENT_AMBULATORY_CARE_PROVIDER_SITE_OTHER): Payer: Medicare Other | Admitting: Neurology

## 2013-09-26 ENCOUNTER — Encounter: Payer: Self-pay | Admitting: Neurology

## 2013-09-26 VITALS — BP 115/65 | HR 58 | Wt 129.0 lb

## 2013-09-26 DIAGNOSIS — R269 Unspecified abnormalities of gait and mobility: Secondary | ICD-10-CM

## 2013-09-26 NOTE — Progress Notes (Signed)
Reason for visit: Arm weakness  Amanda West is an 67 y.o. female  History of present illness:  Amanda West is a 67 year old right-handed white female with episodes of arm heaviness, discomfort, and occasionally slurred speech. The patient was set up for MRI evaluation of the brain which showed mild small vessel disease. The patient had MRA of the head that was unremarkable. The patient has had one brief episode on 09/14/2013. The patient has had some gait instability following this event. The patient has cervical spondylosis as well. The patient has a pressure sensation in the head. The patient was given nortriptyline previously for this, but she never took the medication. The patient has not had any falls. The patient was told that she may have Mnire's disease, but she does not have vertigo. The patient will occasionally have a pressure sensation and discomfort in her right ear, and she notes that Sudafed will help this. The patient has been seen by an ENT physician previously. The patient has not undergone prolonged cardiac monitoring.  Past Medical History  Diagnosis Date  . H/O hiatal hernia   . Anemia   . Shingles   . Osteoporosis   . Abnormality of gait 07/22/2013  . Vitamin D deficiency     Past Surgical History  Procedure Laterality Date  . Hertial hernia    . Pars plana vitrectomy  07/06/2012    Procedure: PARS PLANA VITRECTOMY WITH 25G REMOVAL/SUTURE INTRAOCULAR LENS;  Surgeon: Sherrie George, MD;  Location: [facility] OR;  Service: Ophthalmology;  Laterality: Left;  . Cataract extraction      Family History  Problem Relation Age of Onset  . Heart disease Mother   . Heart disease Father   . Diabetes Sister   . Cancer Brother   . Heart disease Brother     Social history:  reports that she has quit smoking. She quit smokeless tobacco use about 3 years ago. She reports that she does not drink alcohol or use illicit drugs.    Allergies  Allergen Reactions  . Codeine Nausea  Only    Medications:  Current Outpatient Prescriptions on File Prior to Visit  Medication Sig Dispense Refill  . aspirin 81 MG chewable tablet Chew 81 mg by mouth daily.      Marland Kitchen CALCIUM-MAGNESIUM-ZINC PO Take 1 tablet by mouth 2 (two) times daily.      Marland Kitchen CHERRY CONCENTRATE PO Take 1,000 mg by mouth daily.      . Cholecalciferol (VITAMIN D-3) 5000 UNITS TABS Take 5,000 Int'l Units by mouth daily.      . Chromium Aspartate 1000 MCG TABS Take 1,000 mcg by mouth daily.      . Ferrous Sulfate (IRON) 325 (65 FE) MG TABS Take 65 mg by mouth daily.      . fish oil-omega-3 fatty acids 1000 MG capsule Take 1 g by mouth 2 (two) times daily.       . folic acid (FOLVITE) 400 MCG tablet Take 400 mcg by mouth daily.      . Methylcobalamin (B-12) 5000 MCG TBDP Take 5,000 mcg by mouth daily.      . phenylephrine (SUDAFED PE) 10 MG TABS Take 10 mg by mouth daily as needed (for ear).       . Potassium 99 MG TABS Take 99 mg by mouth daily.      . Pyridoxine HCl (VITAMIN B-6 CR) 200 MG TBCR Take 1 tablet by mouth daily.      Marland Kitchen senna (SENOKOT)  8.6 MG tablet Take 2 tablets by mouth 2 (two) times daily.      . valACYclovir (VALTREX) 500 MG tablet Take 500 mg by mouth as needed.      . vitamin C (ASCORBIC ACID) 500 MG tablet Take 500 mg by mouth daily.       No current facility-administered medications on file prior to visit.    ROS:  Out of a complete 14 system review of symptoms, the patient complains only of the following symptoms, and all other reviewed systems are negative.  Pressure sensations in the head Transient arm heaviness Gait instability  Blood pressure 115/65, pulse 58, weight 129 lb (58.514 kg).  Physical Exam  General: The patient is alert and cooperative at the time of the examination.  Skin: No significant peripheral edema is noted.   Neurologic Exam  Cranial nerves: Facial symmetry is present. Speech is normal, no aphasia or dysarthria is noted. Extraocular movements are full.  Visual fields are full.  Motor: The patient has good strength in all 4 extremities.  Coordination: The patient has good finger-nose-finger and heel-to-shin bilaterally.  Gait and station: The patient has a normal gait. Tandem gait is minimally unsteady. Romberg is negative. No drift is seen.  Reflexes: Deep tendon reflexes are symmetric.   Assessment/Plan:  1. Episodic arm heaviness, slurred speech, arm discomfort  2. Cervical spondylosis, spinal stenosis  Etiology of the events of arm heaviness it is not clear. The patient will be set up for a 3 week CardioNet monitor to exclude the possibility of cardiac rhythm abnormalities. The patient likely has a pressure sensation, some dizziness and gait instability associated with her cervical spondylosis, and a tension type headache. The patient will followup through this office in 6 months. The patient is on low-dose aspirin.  Marlan Palau MD 09/26/2013 6:56 PM  [facility] [address] [place], [place] [address]  Phone [phone] Fax [phone]

## 2013-09-28 ENCOUNTER — Other Ambulatory Visit: Payer: Self-pay | Admitting: Neurology

## 2013-09-28 DIAGNOSIS — R55 Syncope and collapse: Secondary | ICD-10-CM

## 2013-10-18 ENCOUNTER — Encounter: Payer: Self-pay | Admitting: *Deleted

## 2013-10-18 ENCOUNTER — Encounter (INDEPENDENT_AMBULATORY_CARE_PROVIDER_SITE_OTHER): Payer: Medicare Other

## 2013-10-18 DIAGNOSIS — R55 Syncope and collapse: Secondary | ICD-10-CM

## 2013-10-18 NOTE — Progress Notes (Signed)
Patient ID: Amanda West, female   DOB: [birth year], 67 y.o.   MRN: [id number] E-Cardio verite 21 day cardiac event monitor applied to patient.

## 2013-11-22 ENCOUNTER — Telehealth: Payer: Self-pay | Admitting: Neurology

## 2013-11-22 NOTE — Telephone Encounter (Signed)
I called patient. The patient has had a monitor on for about 3 weeks. The patient will need to take a monitor back to the cardiology office to have it removed, and hopefully they'll get the report to me within the next several days.

## 2013-11-22 NOTE — Telephone Encounter (Signed)
Please advise 

## 2013-12-12 ENCOUNTER — Telehealth: Payer: Self-pay | Admitting: Neurology

## 2013-12-12 MED ORDER — NORTRIPTYLINE HCL 10 MG PO CAPS: ORAL_CAPSULE | ORAL | Status: DC

## 2013-12-12 NOTE — Telephone Encounter (Signed)
Please advise 

## 2013-12-12 NOTE — Telephone Encounter (Signed)
Patient called stating that she is waiting on results from her heart monitor. Please call the patient, she says a month has passed already. She can be reached at her home phone and if she doesn't answer, she can be reached on her cell phone as well.

## 2013-12-12 NOTE — Telephone Encounter (Signed)
I called patient. The prolonged cardiac monitor was unremarkable. The patient reports ongoing daily events feeling good in the morning, and as the day goes on, she feels more fatigued, with a pressure or heaviness in the head. I wonder if this may present a form of muscle tension headache. The patient also has a prior history of Mnire's disease. We will give a try on nortriptyline.

## 2014-01-03 ENCOUNTER — Other Ambulatory Visit (HOSPITAL_COMMUNITY): Payer: Self-pay | Admitting: Family Medicine

## 2014-01-03 DIAGNOSIS — Z1231 Encounter for screening mammogram for malignant neoplasm of breast: Secondary | ICD-10-CM

## 2014-01-05 ENCOUNTER — Ambulatory Visit (HOSPITAL_COMMUNITY)
Admission: RE | Admit: 2014-01-05 | Discharge: 2014-01-05 | Disposition: A | Discharge status: Home / Self Care | Payer: Medicare Other | Source: Ambulatory Visit | Attending: Family Medicine | Admitting: Family Medicine

## 2014-01-05 DIAGNOSIS — Z1231 Encounter for screening mammogram for malignant neoplasm of breast: Secondary | ICD-10-CM

## 2014-02-08 ENCOUNTER — Ambulatory Visit (INDEPENDENT_AMBULATORY_CARE_PROVIDER_SITE_OTHER): Payer: Medicare Other | Admitting: Ophthalmology

## 2014-02-08 DIAGNOSIS — H35379 Puckering of macula, unspecified eye: Secondary | ICD-10-CM

## 2014-03-28 ENCOUNTER — Ambulatory Visit: Payer: Medicare Other | Admitting: Neurology

## 2014-09-14 ENCOUNTER — Ambulatory Visit: Payer: Medicare Other | Admitting: Neurology

## 2014-09-15 ENCOUNTER — Encounter: Payer: Self-pay | Admitting: Neurology

## 2014-09-15 ENCOUNTER — Ambulatory Visit (INDEPENDENT_AMBULATORY_CARE_PROVIDER_SITE_OTHER): Payer: Medicare Other | Admitting: Neurology

## 2014-09-15 VITALS — BP 120/70 | HR 57 | Ht 63.0 in | Wt 129.6 lb

## 2014-09-15 DIAGNOSIS — M4802 Spinal stenosis, cervical region: Secondary | ICD-10-CM | POA: Insufficient documentation

## 2014-09-15 HISTORY — DX: Spinal stenosis, cervical region: M48.02

## 2014-09-15 MED ORDER — NORTRIPTYLINE HCL 10 MG PO CAPS
ORAL_CAPSULE | ORAL | Status: DC
Start: 2014-09-15 — End: 2014-09-19

## 2014-09-15 NOTE — Progress Notes (Signed)
Reason for visit: Lightheaded sensations  Amanda West is an 68 y.o. female  History of present illness:  Ms. Amanda West is a 68 year old right-handed white female with a history of cervical spondylosis with multilevel spinal stenosis. The patient has been seen through neurosurgery in the past, but surgery was not recommended at that time. The patient has had daily issues with some discomfort behind the ear on the right, and a pressure sensation in the back of the head that is associated with a lightheaded, floaty feeling. The patient will have intermittent episodes of discomfort in the chest, and down the arms on both sides. The patient feels somewhat unstable on her feet, but she denies any falls. She is not using a cane or a walker for ambulation. She denies any numbness or weakness of the extremities, and she denies issues controlling the bowels or the bladder. She indicates that she has not worsened since last seen one year ago, but she has not gotten better either. The patient has undergone MRI evaluation of the brain has shown evidence of minimal small vessel ischemic changes. The patient also had MRA evaluation of the brain. The patient was given a prescription for nortriptyline to help treat what was felt to be a cervicogenic tension-type headache. The patient never took the medication. She has been seen through ENT with Dr. Gean QuintWolicke, and he did not feel there was any ear issue that was the cause of her current problems. The patient was told that she had a history of Mnire's disease previously, but Dr. Gean QuintWolicke did not feel this was the case. The patient returns for an evaluation.  Past Medical History  Diagnosis Date  . H/O hiatal hernia   . Anemia   . Shingles   . Osteoporosis   . Abnormality of gait 07/22/2013  . Vitamin D deficiency   . Spinal stenosis in cervical region 09/15/2014    Past Surgical History  Procedure Laterality Date  . Hertial hernia    . Pars plana vitrectomy   07/06/2012    Procedure: PARS PLANA VITRECTOMY WITH 25G REMOVAL/SUTURE INTRAOCULAR LENS;  Surgeon: Sherrie GeorgeJohn D Matthews, MD;  [facility] OR;  Service: Ophthalmology;  Laterality: Left;  . Cataract extraction      Family History  Problem Relation Age of Onset  . Heart disease Mother   . Heart disease Father   . Diabetes Sister   . Cancer Brother   . Heart disease Brother     Social history:  reports that she has quit smoking. She quit smokeless tobacco use about 4 years ago. She reports that she does not drink alcohol or use illicit drugs.    Allergies  Allergen Reactions  . Codeine Nausea Only    Medications:  Current Outpatient Prescriptions on File Prior to Visit  Medication Sig Dispense Refill  . aspirin 81 MG chewable tablet Chew 81 mg by mouth daily.      . Bromfenac Sodium 0.09 % SOLN Apply 0.09 drops to eye 1 day or 1 dose.      Marland Kitchen. CALCIUM-MAGNESIUM-ZINC PO Take 1 tablet by mouth 2 (two) times daily.      Marland Kitchen. CHERRY CONCENTRATE PO Take 1,000 mg by mouth daily.      . Chromium Aspartate 1000 MCG TABS Take 1,000 mcg by mouth daily.      . Ferrous Sulfate (IRON) 325 (65 FE) MG TABS Take 65 mg by mouth daily.      . fish oil-omega-3 fatty acids 1000 MG  capsule Take 1 g by mouth 2 (two) times daily.       . folic acid (FOLVITE) 400 MCG tablet Take 400 mcg by mouth daily.      . Methylcobalamin (B-12) 5000 MCG TBDP Take 5,000 mcg by mouth daily.      . phenylephrine (SUDAFED PE) 10 MG TABS Take 10 mg by mouth daily as needed (for ear).       . Potassium 99 MG TABS Take 99 mg by mouth daily.      Marland Kitchen. senna (SENOKOT) 8.6 MG tablet Take 2 tablets by mouth 2 (two) times daily.      . valACYclovir (VALTREX) 500 MG tablet Take 500 mg by mouth as needed.      . chlorthalidone (HYGROTON) 25 MG tablet Take 25 mg by mouth as directed.      . Cholecalciferol (VITAMIN D-3) 5000 UNITS TABS Take 5,000 Int'l Units by mouth daily.      . vitamin C (ASCORBIC ACID) 500 MG tablet Take 500 mg by mouth  daily.       No current facility-administered medications on file prior to visit.    ROS:  Out of a complete 14 system review of symptoms, the patient complains only of the following symptoms, and all other reviewed systems are negative.  Gait instability, dizziness  Blood pressure 120/70, pulse 57, height 5\' 3"  (1.6 m), weight 129 lb 9.6 oz (58.786 kg).  Blood pressure, right arm, standing is 148/72. Blood pressure, sitting, right arm is 140/74.  Physical Exam  General: The patient is alert and cooperative at the time of the examination.  Neuromuscular: Range of movement of the cervical spine is relatively full.  Skin: No significant peripheral edema is noted.   Neurologic Exam  Mental status: The patient is oriented x 3.  Cranial nerves: Facial symmetry is present. Speech is normal, no aphasia or dysarthria is noted. Extraocular movements are full. Visual fields are full.  Motor: The patient has good strength in all 4 extremities.  Sensory examination: soft touch sensation is symmetric on the face, arms, and legs.   Coordination: The patient has good finger-nose-finger and heel-to-shin bilaterally.  Gait and station: The patient has a normal gait. Tandem gait is minimally unsteady. Romberg is negative. No drift is seen.  Reflexes: Deep tendon reflexes are symmetric, and are normal .    07/07/13 MRI cervical:  IMPRESSION:  1. There is multilevel spondylosis superimposed on a congenitally  small spinal canal. There is moderate resulting central stenosis  from C4-C5 through C6-C7. Mild cord flattening is present at the  C4-C5 and C5-C6 levels.  2. Mild to moderate foraminal narrowing is present at multiple  levels as detailed above, greatest at C5-C6 and C6-C7.  3. No abnormal cord signal identified.    MRI brain 08/05/13:  IMPRESSION:  Abnormal MRI brain (without) demonstrating:  1. Mild scattered periventricular and subcortical chronic small vessel ischemic  disease.  2. No acute findings.     Assessment/Plan:  One. Cervical spondylosis, cervical spinal stenosis  2. Minimal small vessel disease by MRI brain  3. Probable cervicogenic headache  Clinically, the patient is stable over the last year without deterioration in her ability to ambulate. She reports no falls. The patient is feeling poorly with occasional episodes of upper shoulder, arm, and chest discomfort that likely is related to the cervical spine disease. The patient has a pressure sensation in the back of the head that likely represents a low-grade cervicogenic muscle tension  headache. The patient will be placed again on nortriptyline, and if this is effective, the medication will be continued. If the patient believes that there is a deterioration in her ability to ambulate, another evaluation through a neurosurgeon should be obtained. She will followup in 3-4 months.  Marlan Palau MD 09/16/2014 10:16 AM  [facility] [address] [place], [place] [address]  Phone [phone] Fax [phone]

## 2014-09-15 NOTE — Patient Instructions (Signed)

## 2014-09-18 ENCOUNTER — Telehealth: Payer: Self-pay | Admitting: *Deleted

## 2014-09-18 NOTE — Telephone Encounter (Signed)
I called patient. The patient is having some blurred vision on the nortriptyline. She is only on a 10 mg dose, and she believes that it is helping her. I have recommended that she try staying with this dose, not go up to a higher dosing level. If she cannot tolerate the medication after 7-10 days, she is to contact our office, and we will need to try another medication.

## 2014-09-18 NOTE — Telephone Encounter (Signed)
Patient states that she started the Nortriptyline on last Friday and states that today that she is having trouble seeing to read, and states that she believes that medication is affecting her vision.

## 2014-09-19 ENCOUNTER — Telehealth: Payer: Self-pay | Admitting: Neurology

## 2014-09-19 MED ORDER — DULOXETINE HCL 30 MG PO CPEP: 30.0000 mg | ORAL_CAPSULE | Freq: Every day | ORAL | Status: DC

## 2014-09-19 NOTE — Telephone Encounter (Signed)
Patient called and stated she can't tolerate Rx nortriptyline (PAMELOR) 10 MG capsule.  Questioning if there's an alternative.  Please call home # [phone], if not available please call cell @ [phone].

## 2014-09-19 NOTE — Telephone Encounter (Signed)
I called the patient. The nortriptyline could not be tolerated. We will try Cymbalta.

## 2014-09-19 NOTE — Telephone Encounter (Signed)
See phone note

## 2014-10-25 ENCOUNTER — Encounter: Payer: Self-pay | Admitting: Neurology

## 2014-10-31 ENCOUNTER — Encounter: Payer: Self-pay | Admitting: Neurology

## 2014-12-18 ENCOUNTER — Other Ambulatory Visit (HOSPITAL_COMMUNITY): Payer: Self-pay | Admitting: Family Medicine

## 2014-12-18 ENCOUNTER — Telehealth: Payer: Self-pay | Admitting: Neurology

## 2014-12-18 DIAGNOSIS — Z1231 Encounter for screening mammogram for malignant neoplasm of breast: Secondary | ICD-10-CM

## 2014-12-18 MED ORDER — DULOXETINE HCL 20 MG PO CPEP: 20.0000 mg | ORAL_CAPSULE | Freq: Every day | ORAL | Status: DC

## 2014-12-18 NOTE — Telephone Encounter (Signed)
Pt wants to know if she can decrease the DULoxetine (CYMBALTA) 30 MG capsule to 20mg .  Please call and advise.

## 2014-12-18 NOTE — Telephone Encounter (Signed)
I called back.  Patient said she feels like Cymbalta has been helpful, but is experiencing Restless Legs.  She would like to know if the provider would be agreeable to decreasing the Cymbalta to 20mg  to see if this would help.  Please advise.  Thank you.

## 2014-12-18 NOTE — Telephone Encounter (Signed)
I called patient. The Cymbalta is helping quite a bit, but she has developed restless legs. We will try to 20 mg dose this if this alleviates the restless leg problems.

## 2015-01-08 ENCOUNTER — Other Ambulatory Visit: Payer: Self-pay | Admitting: Family Medicine

## 2015-01-08 DIAGNOSIS — N644 Mastodynia: Secondary | ICD-10-CM

## 2015-01-08 DIAGNOSIS — N6323 Unspecified lump in the left breast, lower outer quadrant: Secondary | ICD-10-CM

## 2015-01-09 ENCOUNTER — Ambulatory Visit (HOSPITAL_COMMUNITY): Payer: Medicare Other

## 2015-01-15 ENCOUNTER — Ambulatory Visit
Admission: RE | Admit: 2015-01-15 | Discharge: 2015-01-15 | Disposition: A | Discharge status: Home / Self Care | Payer: Medicare Other | Source: Ambulatory Visit | Attending: Family Medicine | Admitting: Family Medicine

## 2015-01-15 ENCOUNTER — Other Ambulatory Visit: Payer: Self-pay | Admitting: Orthopedic Surgery

## 2015-01-15 DIAGNOSIS — N6323 Unspecified lump in the left breast, lower outer quadrant: Secondary | ICD-10-CM

## 2015-01-15 DIAGNOSIS — N644 Mastodynia: Secondary | ICD-10-CM

## 2015-01-15 DIAGNOSIS — M25561 Pain in right knee: Secondary | ICD-10-CM

## 2015-01-17 ENCOUNTER — Ambulatory Visit: Payer: Medicare Other | Admitting: Neurology

## 2015-01-19 ENCOUNTER — Ambulatory Visit (INDEPENDENT_AMBULATORY_CARE_PROVIDER_SITE_OTHER): Payer: 59 | Admitting: Neurology

## 2015-01-19 ENCOUNTER — Encounter: Payer: Self-pay | Admitting: Neurology

## 2015-01-19 VITALS — BP 117/63 | HR 72 | Ht 62.0 in | Wt 123.6 lb

## 2015-01-19 DIAGNOSIS — M4802 Spinal stenosis, cervical region: Secondary | ICD-10-CM

## 2015-01-19 NOTE — Progress Notes (Signed)
Reason for visit: Cervical spondylosis  Amanda West is an 69 y.o. female  History of present illness:  Amanda West is a 69 year old right-handed white [place]illie [place]ale with a history of cervical spondylosis, with cervical strain associated with some dizziness. The patient has not been able tolerate nortriptyline secondary to blurred vision. She was placed on Cymbalta on a she currently is on 20 mg daily, and she believes that this is helpful for her. She has not had any significant issues with dizziness currently. The patient has had some right knee arthritis, and she recently had fluid drawn off the knee which helped her sensation of restless legs. The patient overall is doing well. She returns for an evaluation. The patient does indicate that she has episodes of nocturnal leg cramps in the left calf muscle at times.  Past Medical History  Diagnosis Date  . H/O hiatal hernia   . Anemia   . Shingles   . Osteoporosis   . Abnormality of gait 07/22/2013  . Vitamin D deficiency   . Spinal stenosis in cervical region 09/15/2014    Past Surgical History  Procedure Laterality Date  . Hertial hernia    . Pars plana vitrectomy  07/06/2012    Procedure: PARS PLANA VITRECTOMY WITH 25G REMOVAL/SUTURE INTRAOCULAR LENS;  Surgeon: Sherrie GeorgeJohn D Matthews, MD;  [facility];  Service: Ophthalmology;  Laterality: Left;  . Cataract extraction      Family History  Problem Relation Age of Onset  . Heart disease Mother   . Heart disease Father   . Diabetes Sister   . Cancer Brother   . Heart disease Brother     Social history:  reports that she has quit smoking. She quit smokeless tobacco use about 5 years ago. She reports that she does not drink alcohol or use illicit drugs.    Allergies  Allergen Reactions  . Codeine Nausea Only  . Nortriptyline     Blurred vision    Medications:  Current Outpatient Prescriptions on File Prior to Visit  Medication Sig Dispense Refill  . aspirin 81 MG chewable tablet  Chew 81 mg by mouth daily.    Marland Kitchen. CALCIUM-MAGNESIUM-ZINC PO Take 1 tablet by mouth 2 (two) times daily.    Marland Kitchen. CHERRY CONCENTRATE PO Take 1,000 mg by mouth daily.    . chlorthalidone (HYGROTON) 25 MG tablet Take 25 mg by mouth as directed.    . Chromium Aspartate 1000 MCG TABS Take 1,000 mcg by mouth daily.    . DULoxetine (CYMBALTA) 20 MG capsule Take 1 capsule (20 mg total) by mouth daily. 30 capsule 3  . Ferrous Sulfate (IRON) 325 (65 FE) MG TABS Take 65 mg by mouth daily.    . fish oil-omega-3 fatty acids 1000 MG capsule Take 1 g by mouth 2 (two) times daily.     Marland Kitchen. FLUVIRIN SUSP Inject 1 each into the muscle once.    . folic acid (FOLVITE) 400 MCG tablet Take 400 mcg by mouth daily.    . phenylephrine (SUDAFED PE) 10 MG TABS Take 10 mg by mouth daily as needed (for ear).     . Potassium 99 MG TABS Take 99 mg by mouth daily.    Marland Kitchen. senna (SENOKOT) 8.6 MG tablet Take 2 tablets by mouth 2 (two) times daily.    . valACYclovir (VALTREX) 500 MG tablet Take 500 mg by mouth as needed.    . vitamin C (ASCORBIC ACID) 500 MG tablet Take 500 mg by mouth daily.  No current facility-administered medications on file prior to visit.    ROS:  Out of a complete 14 system review of symptoms, the patient complains only of the following symptoms, and all other reviewed systems are negative.  Joint pain, joint swelling, aching muscles, muscle cramps  Blood pressure 117/63, pulse 72, height  (1.575 m), weight 123 lb 9.6 oz (56.065 kg).  Physical Exam  General: The patient is alert and cooperative at the time of the examination.  Skin: No significant peripheral edema is noted.   Neurologic Exam  Mental status: The patient is oriented x 3.  Cranial nerves: Facial symmetry is present. Speech is normal, no aphasia or dysarthria is noted. Extraocular movements are full. Visual fields are full.  Motor: The patient has good strength in all 4 extremities.  Sensory examination: Soft touch sensation  is symmetric on the face, arms, and legs.  Coordination: The patient has good finger-nose-finger and heel-to-shin bilaterally.  Gait and station: The patient has a normal gait. Tandem gait is normal. Romberg is negative. No drift is seen.  Reflexes: Deep tendon reflexes are symmetric.   Assessment/Plan:  1. Cervical spondylosis  2. Dizziness secondary to #1  The patient doing well at this time. She will continue the Cymbalta at the 20 mg dose. If she requires a higher dose, she will call our office. Otherwise, she will follow-up in about 6 months.  Marlan Palau MD 01/19/2015 2:41 PM  [facility] [address] [place], [place] [address]  Phone [phone] Fax [phone]

## 2015-01-19 NOTE — Patient Instructions (Signed)

## 2015-01-22 ENCOUNTER — Other Ambulatory Visit: Payer: Medicare Other

## 2015-01-29 ENCOUNTER — Ambulatory Visit
Admission: RE | Admit: 2015-01-29 | Discharge: 2015-01-29 | Disposition: A | Discharge status: Home / Self Care | Payer: Medicare Other | Source: Ambulatory Visit | Attending: Orthopedic Surgery | Admitting: Orthopedic Surgery

## 2015-01-29 DIAGNOSIS — M25561 Pain in right knee: Secondary | ICD-10-CM

## 2015-02-14 ENCOUNTER — Ambulatory Visit (INDEPENDENT_AMBULATORY_CARE_PROVIDER_SITE_OTHER): Payer: Medicare Other | Admitting: Ophthalmology

## 2015-02-14 DIAGNOSIS — H35373 Puckering of macula, bilateral: Secondary | ICD-10-CM

## 2015-03-17 ENCOUNTER — Other Ambulatory Visit: Payer: Self-pay | Admitting: Neurology

## 2015-07-18 ENCOUNTER — Other Ambulatory Visit: Payer: Self-pay | Admitting: Neurology

## 2015-07-18 MED ORDER — DULOXETINE HCL 20 MG PO CPEP: 20.0000 mg | ORAL_CAPSULE | Freq: Every day | ORAL | Status: DC

## 2015-07-18 NOTE — Telephone Encounter (Signed)
Last OV note says: She will continue the Cymbalta at the 20 mg dose. If she requires a higher dose, she will call our office

## 2015-07-24 ENCOUNTER — Encounter: Payer: Self-pay | Admitting: Neurology

## 2015-07-24 ENCOUNTER — Ambulatory Visit (INDEPENDENT_AMBULATORY_CARE_PROVIDER_SITE_OTHER): Payer: Medicare Other | Admitting: Neurology

## 2015-07-24 VITALS — BP 119/68 | HR 70 | Ht 63.0 in | Wt 124.0 lb

## 2015-07-24 DIAGNOSIS — M4802 Spinal stenosis, cervical region: Secondary | ICD-10-CM

## 2015-07-24 DIAGNOSIS — R269 Unspecified abnormalities of gait and mobility: Secondary | ICD-10-CM

## 2015-07-24 MED ORDER — DULOXETINE HCL 20 MG PO CPEP: 20.0000 mg | ORAL_CAPSULE | Freq: Every day | ORAL | Status: DC

## 2015-07-24 NOTE — Progress Notes (Signed)
Reason for visit: Cervical spondylosis  Amanda West is an 69 y.o. female  History of present illness:  Amanda West is a 69 year old right-handed white female with a history of cervical spondylosis and neuromuscular discomfort. The patient has been on low-dose Cymbalta, taking 20 mg daily. The patient indicates that this has been quite effective, it causes slight drowsiness, but she is able to tolerate this dose. The patient has minimal pressure in the neck and shoulders later in the day, but she generally does well overall. The patient denies any new medical issues that have come up since last seen. She returns this office for an evaluation. She sleeps well at night, with minimal discomfort.  Past Medical History  Diagnosis Date  . H/O hiatal hernia   . Anemia   . Shingles   . Osteoporosis   . Abnormality of gait 07/22/2013  . Vitamin D deficiency   . Spinal stenosis in cervical region 09/15/2014    Past Surgical History  Procedure Laterality Date  . Hertial hernia    . Pars plana vitrectomy  07/06/2012    Procedure: PARS PLANA VITRECTOMY WITH 25G REMOVAL/SUTURE INTRAOCULAR LENS;  Surgeon: Sherrie George, MD;  [facility] OR;  Service: Ophthalmology;  Laterality: Left;  . Cataract extraction      Family History  Problem Relation Age of Onset  . Heart disease Mother   . Heart disease Father   . Diabetes Sister   . Cancer Brother   . Heart disease Brother     Social history:  reports that she has quit smoking. She quit smokeless tobacco use about 5 years ago. She reports that she does not drink alcohol or use illicit drugs.    Allergies  Allergen Reactions  . Codeine Nausea Only  . Nortriptyline     Blurred vision    Medications:  Prior to Admission medications   Medication Sig Start Date End Date Taking? Authorizing Provider  aspirin 81 MG chewable tablet Chew 81 mg by mouth daily.   Yes Historical Provider, MD  CALCIUM-MAGNESIUM-ZINC PO Take 1 tablet by mouth 2  (two) times daily.   Yes Historical Provider, MD  Capsicum, Cayenne, (CAYENNE PEPPER PO) Take 1 capsule by mouth daily.   Yes Historical Provider, MD  Chromium Aspartate 1000 MCG TABS Take 1,000 mcg by mouth daily.   Yes Historical Provider, MD  CINNAMON PO Take 2 capsules by mouth daily.   Yes Historical Provider, MD  DULoxetine (CYMBALTA) 20 MG capsule Take 1 capsule (20 mg total) by mouth daily. 07/18/15  Yes York Spaniel, MD  DULoxetine (CYMBALTA) 30 MG capsule TAKE ONE CAPSULE BY MOUTH ONCE DAILY 03/18/15  Yes York Spaniel, MD  Ferrous Sulfate (IRON) 325 (65 FE) MG TABS Take 65 mg by mouth daily.   Yes Historical Provider, MD  fish oil-omega-3 fatty acids 1000 MG capsule Take 1 g by mouth 2 (two) times daily.    Yes Historical Provider, MD  folic acid (FOLVITE) 400 MCG tablet Take 400 mcg by mouth daily.   Yes Historical Provider, MD  phenylephrine (SUDAFED PE) 10 MG TABS Take 10 mg by mouth daily as needed (for ear).    Yes Historical Provider, MD  Potassium 99 MG TABS Take 99 mg by mouth daily.   Yes Historical Provider, MD  senna (SENOKOT) 8.6 MG tablet Take 2 tablets by mouth 2 (two) times daily.   Yes Historical Provider, MD  valACYclovir (VALTREX) 500 MG tablet Take 500 mg by  mouth as needed.   Yes Historical Provider, MD  vitamin B-12 (CYANOCOBALAMIN) 1000 MCG tablet Take 1,000 mcg by mouth daily.   Yes Historical Provider, MD  vitamin C (ASCORBIC ACID) 500 MG tablet Take 500 mg by mouth daily.   Yes Historical Provider, MD    ROS:  Out of a complete 14 system review of symptoms, the patient complains only of the following symptoms, and all other reviewed systems are negative.  Light sensitivity Muscle cramps  Blood pressure 119/68, pulse 70, height 5\' 3"  (1.6 m), weight 124 lb (56.246 kg).  Physical Exam  General: The patient is alert and cooperative at the time of the examination.  Skin: No significant peripheral edema is noted.   Neurologic Exam  Mental status:  The patient is alert and oriented x 3 at the time of the examination. The patient has apparent normal recent and remote memory, with an apparently normal attention span and concentration ability.   Cranial nerves: Facial symmetry is present. Speech is normal, no aphasia or dysarthria is noted. Extraocular movements are full. Visual fields are full.  Motor: The patient has good strength in all 4 extremities.  Sensory examination: Soft touch sensation is symmetric on the face, arms, and legs.  Coordination: The patient has good finger-nose-finger and heel-to-shin bilaterally.  Gait and station: The patient has a normal gait. Tandem gait is slightly unsteady. Romberg is negative. No drift is seen.  Reflexes: Deep tendon reflexes are symmetric.   Assessment/Plan:  1. Cervical spondylosis, neuromuscular discomfort  The patient is doing fairly well at this time, we will continue the Cymbalta, a prescription was called in. She will follow-up in one year, or sooner if needed.  Marlan Palau MD 07/24/2015 8:03 PM  [facility] [address] [place], [place] [address]  Phone [phone] Fax [phone]

## 2015-07-24 NOTE — Patient Instructions (Signed)

## 2015-12-21 ENCOUNTER — Other Ambulatory Visit: Payer: Self-pay

## 2015-12-21 DIAGNOSIS — Z1231 Encounter for screening mammogram for malignant neoplasm of breast: Secondary | ICD-10-CM

## 2016-01-17 ENCOUNTER — Ambulatory Visit
Admission: RE | Admit: 2016-01-17 | Discharge: 2016-01-17 | Disposition: A | Discharge status: Home / Self Care | Payer: Medicare Other | Source: Ambulatory Visit

## 2016-01-17 DIAGNOSIS — Z1231 Encounter for screening mammogram for malignant neoplasm of breast: Secondary | ICD-10-CM

## 2016-02-10 ENCOUNTER — Other Ambulatory Visit: Payer: Self-pay | Admitting: Neurology

## 2016-02-12 ENCOUNTER — Other Ambulatory Visit: Payer: Self-pay | Admitting: Neurology

## 2016-02-18 ENCOUNTER — Ambulatory Visit (INDEPENDENT_AMBULATORY_CARE_PROVIDER_SITE_OTHER): Payer: Medicare Other | Admitting: Ophthalmology

## 2016-02-18 DIAGNOSIS — H35373 Puckering of macula, bilateral: Secondary | ICD-10-CM

## 2016-02-18 DIAGNOSIS — H353111 Nonexudative age-related macular degeneration, right eye, early dry stage: Secondary | ICD-10-CM

## 2016-02-18 DIAGNOSIS — H2703 Aphakia, bilateral: Secondary | ICD-10-CM

## 2016-03-20 ENCOUNTER — Other Ambulatory Visit: Payer: Self-pay | Admitting: Physician Assistant

## 2016-07-10 ENCOUNTER — Other Ambulatory Visit: Payer: Self-pay | Admitting: Neurology

## 2016-07-23 ENCOUNTER — Ambulatory Visit: Payer: Medicare Other | Admitting: Adult Health

## 2016-07-29 ENCOUNTER — Ambulatory Visit (INDEPENDENT_AMBULATORY_CARE_PROVIDER_SITE_OTHER): Payer: Medicare Other | Admitting: Adult Health

## 2016-07-29 ENCOUNTER — Encounter: Payer: Self-pay | Admitting: Adult Health

## 2016-07-29 VITALS — BP 114/66 | HR 61 | Ht 63.0 in | Wt 126.0 lb

## 2016-07-29 DIAGNOSIS — M791 Myalgia: Secondary | ICD-10-CM | POA: Diagnosis not present

## 2016-07-29 DIAGNOSIS — M47812 Spondylosis without myelopathy or radiculopathy, cervical region: Secondary | ICD-10-CM | POA: Diagnosis not present

## 2016-07-29 DIAGNOSIS — M609 Myositis, unspecified: Secondary | ICD-10-CM

## 2016-07-29 DIAGNOSIS — IMO0001 Reserved for inherently not codable concepts without codable children: Secondary | ICD-10-CM

## 2016-07-29 NOTE — Progress Notes (Signed)
PATIENT: Amanda West DOB: [birth year]  REASON FOR VISIT: follow up- cervical spondylosis, neuromuscular discomfort HISTORY FROM: patient  HISTORY OF PRESENT ILLNESS: Amanda West is a 70 year old female with a history of cervical spondylosis and neuromuscular discomfort. She returns today for follow-up. She is currently taking Cymbalta 20 mg daily. She feels that this is working well for her discomfort. She states occasionally she'll get some discomfort in the upper arms but this discomfort only last for several seconds. She does not feel that she needs to adjust her medication because of this. She denies any new neurological symptoms. Returns today for an evaluation.  HISTORY 07/24/15: Amanda West is a 70 year old right-handed white female with a history of cervical spondylosis and neuromuscular discomfort. The patient has been on low-dose Cymbalta, taking 20 mg daily. The patient indicates that this has been quite effective, it causes slight drowsiness, but she is able to tolerate this dose. The patient has minimal pressure in the neck and shoulders later in the day, but she generally does well overall. The patient denies any new medical issues that have come up since last seen. She returns this office for an evaluation. She sleeps well at night, with minimal discomfort.  REVIEW OF SYSTEMS: Out of a complete 14 system review of symptoms, the patient complains only of the following symptoms, and all other reviewed systems are negative.  See history of present illness  ALLERGIES: Allergies  Allergen Reactions  . Codeine Nausea Only  . Nortriptyline     Blurred vision    HOME MEDICATIONS: Outpatient Medications Prior to Visit  Medication Sig Dispense Refill  . aspirin 81 MG chewable tablet Chew 81 mg by mouth daily.    Marland Kitchen. CALCIUM-MAGNESIUM-ZINC PO Take 1 tablet by mouth 2 (two) times daily.    . Capsicum, Cayenne, (CAYENNE PEPPER PO) Take 1 capsule by mouth daily.    . Chromium Aspartate 1000  MCG TABS Take 1,000 mcg by mouth daily.    Marland Kitchen. CINNAMON PO Take 2 capsules by mouth daily.    . DULoxetine (CYMBALTA) 20 MG capsule TAKE ONE CAPSULE BY MOUTH ONCE DAILY 90 capsule 3  . Ferrous Sulfate (IRON) 325 (65 FE) MG TABS Take 65 mg by mouth daily.    . fish oil-omega-3 fatty acids 1000 MG capsule Take 1 g by mouth 2 (two) times daily.     . folic acid (FOLVITE) 400 MCG tablet Take 400 mcg by mouth daily.    . phenylephrine (SUDAFED PE) 10 MG TABS Take 10 mg by mouth daily as needed (for ear).     . Potassium 99 MG TABS Take 99 mg by mouth daily.    Marland Kitchen. senna (SENOKOT) 8.6 MG tablet Take 2 tablets by mouth 2 (two) times daily.    . valACYclovir (VALTREX) 500 MG tablet Take 500 mg by mouth as needed.    . vitamin B-12 (CYANOCOBALAMIN) 1000 MCG tablet Take 1,000 mcg by mouth daily.    . vitamin C (ASCORBIC ACID) 500 MG tablet Take 500 mg by mouth daily.     No facility-administered medications prior to visit.     PAST MEDICAL HISTORY: Past Medical History:  Diagnosis Date  . Abnormality of gait 07/22/2013  . Anemia   . H/O hiatal hernia   . Osteoporosis   . Shingles   . Spinal stenosis in cervical region 09/15/2014  . Vitamin D deficiency     PAST SURGICAL HISTORY: Past Surgical History:  Procedure Laterality Date  . CATARACT EXTRACTION    .  Hertial Hernia    . PARS PLANA VITRECTOMY  07/06/2012   Procedure: PARS PLANA VITRECTOMY WITH 25G REMOVAL/SUTURE INTRAOCULAR LENS;  Surgeon: Sherrie GeorgeJohn D Matthews, MD;  [facility];  Service: Ophthalmology;  Laterality: Left;    FAMILY HISTORY: Family History  Problem Relation Age of Onset  . Heart disease Mother   . Heart disease Father   . Diabetes Sister   . Cancer Brother   . Heart disease Brother     SOCIAL HISTORY: Social History   Social History  . Marital status: Married    Spouse name: Phylliss BobReed Mauro  . Number of children: 1  . Years of education: N/A   Occupational History  . [employer]   Social  History Main Topics  . Smoking status: Former Games developermoker  . Smokeless tobacco: Former NeurosurgeonUser    Quit date: 11/14/2009  . Alcohol use No  . Drug use: No  . Sexual activity: Not on file   Other Topics Concern  . Not on file   Social History Narrative   Patient is right handed.   Patient drinks 5-6 cup caffeine daily.      PHYSICAL EXAM  Vitals:   07/29/16 1317  BP: 114/66  Pulse: 61  Weight: 126 lb (57.2 kg)  Height: 5\' 3"  (1.6 m)   Body mass index is 22.32 kg/m.  Generalized: Well developed, in no acute distress   Neurological examination  Mentation: Alert oriented to time, place, history taking. Follows all commands speech and language fluent Cranial nerve II-XII: Pupils were equal round reactive to light. Extraocular movements were full, visual field were full on confrontational test. Facial sensation and strength were normal. Uvula tongue midline. Head turning and shoulder shrug  were normal and symmetric. Motor: The motor testing reveals 5 over 5 strength of all 4 extremities. Good symmetric motor tone is noted throughout.  Sensory: Sensory testing is intact to soft touch on all 4 extremities. No evidence of extinction is noted.  Coordination: Cerebellar testing reveals good finger-nose-finger and heel-to-shin bilaterally.  Gait and station: Gait is normal. Tandem gait is minimally unsteady. Romberg is negative. No drift is seen.  Reflexes: Deep tendon reflexes are symmetric and normal bilaterally.   DIAGNOSTIC DATA (LABS, IMAGING, TESTING) - I reviewed patient records, labs, notes, testing and imaging myself where available.   ASSESSMENT AND PLAN 70 y.o. year old female  has a past medical history of Abnormality of gait (07/22/2013); Anemia; H/O hiatal hernia; Osteoporosis; Shingles; Spinal stenosis in cervical region (09/15/2014); and Vitamin D deficiency. here with:  1. Cervical spondylosis, neuromuscular discomfort  Overall the patient is doing well. She will continue  on 20 mg of Cymbalta. Advised that if her symptoms worsen or she develops any new symptoms she'll let us know. Follow-up in one year with Dr. Anne HahnWillis.     Butch PennyMegan Shonica Weier, MSN, NP-C 07/29/2016, 1:19 PM [facility] [address] [place], [place] [address] [phone]

## 2016-07-29 NOTE — Progress Notes (Signed)
I have read the note, and I agree with the clinical assessment and plan.  WILLIS,CHARLES KEITH   

## 2016-07-29 NOTE — Patient Instructions (Signed)
Continue Cymbalta 20 mg daily If your symptoms worsen or you develop new symptoms please let us know.

## 2016-12-30 ENCOUNTER — Other Ambulatory Visit: Payer: Self-pay | Admitting: Family Medicine

## 2016-12-30 DIAGNOSIS — Z1231 Encounter for screening mammogram for malignant neoplasm of breast: Secondary | ICD-10-CM

## 2017-02-10 ENCOUNTER — Ambulatory Visit
Admission: RE | Admit: 2017-02-10 | Discharge: 2017-02-10 | Disposition: A | Discharge status: Home / Self Care | Payer: Medicare Other | Source: Ambulatory Visit | Attending: Family Medicine | Admitting: Family Medicine

## 2017-02-10 DIAGNOSIS — Z1231 Encounter for screening mammogram for malignant neoplasm of breast: Secondary | ICD-10-CM

## 2017-02-20 ENCOUNTER — Ambulatory Visit (INDEPENDENT_AMBULATORY_CARE_PROVIDER_SITE_OTHER): Payer: Medicare Other | Admitting: Ophthalmology

## 2017-02-20 DIAGNOSIS — H35373 Puckering of macula, bilateral: Secondary | ICD-10-CM

## 2017-07-13 ENCOUNTER — Other Ambulatory Visit: Payer: Self-pay | Admitting: Neurology

## 2017-07-15 ENCOUNTER — Other Ambulatory Visit: Payer: Self-pay | Admitting: Neurology

## 2017-07-30 ENCOUNTER — Ambulatory Visit (INDEPENDENT_AMBULATORY_CARE_PROVIDER_SITE_OTHER): Payer: Medicare Other | Admitting: Neurology

## 2017-07-30 ENCOUNTER — Encounter: Payer: Self-pay | Admitting: Neurology

## 2017-07-30 VITALS — BP 127/71 | HR 65 | Ht 63.0 in

## 2017-07-30 DIAGNOSIS — E538 Deficiency of other specified B group vitamins: Secondary | ICD-10-CM

## 2017-07-30 DIAGNOSIS — R202 Paresthesia of skin: Secondary | ICD-10-CM

## 2017-07-30 DIAGNOSIS — M4802 Spinal stenosis, cervical region: Secondary | ICD-10-CM

## 2017-07-30 MED ORDER — DULOXETINE HCL 20 MG PO CPEP: 20.0000 mg | ORAL_CAPSULE | Freq: Every day | ORAL | 3 refills | Status: DC

## 2017-07-30 NOTE — Progress Notes (Signed)
Reason for visit: cervical spinal stenosis  Amanda West is an 71 y.o. female  History of present illness:  Ms. Amanda West is a 71 year old right-handed white female with a history of cervical spinal stenosis. She has done well with pain on the Cymbalta. Since February of 2018 she has noted hypersensitivity of the forearms bilaterally, with some discomfort and pain that developed in July, but this has since improved. The patient denies any actual weakness of the arms, she denies any sensation changes in the legs or changes in balance. She denies any alteration in bowel or bladder control. She has not had any falls. She denies any increased symptoms with turning the head or looking up or looking down. The hypersensitivity of the forearms continues. She has a sensation of swelling in the arms. She is able to sleep fairly well, the discomfort does not keep her awake. She has some sensation in the feet, left greater than right, as if there is something covering her feet that is not actually there. She returns to the office today for an evaluation.  Past Medical History:  Diagnosis Date  . Abnormality of gait 07/22/2013  . Anemia   . H/O hiatal hernia   . Osteoporosis   . Shingles   . Spinal stenosis in cervical region 09/15/2014  . Vitamin D deficiency     Past Surgical History:  Procedure Laterality Date  . CATARACT EXTRACTION    . Hertial Hernia    . PARS PLANA VITRECTOMY  07/06/2012   Procedure: PARS PLANA VITRECTOMY WITH 25G REMOVAL/SUTURE INTRAOCULAR LENS;  Surgeon: Sherrie George, MD;  Location: [facility] OR;  Service: Ophthalmology;  Laterality: Left;    Family History  Problem Relation Age of Onset  . Heart disease Mother   . Heart disease Father   . Diabetes Sister   . Cancer Brother   . Heart disease Brother     Social history:  reports that she has quit smoking. She quit smokeless tobacco use about 7 years ago. She reports that she does not drink alcohol or use drugs.    Allergies   Allergen Reactions  . Codeine Nausea Only  . Nortriptyline     Blurred vision    Medications:  Prior to Admission medications   Medication Sig Start Date End Date Taking? Authorizing Provider  aspirin 81 MG chewable tablet Chew 81 mg by mouth daily.   Yes [provider]  BIOTIN PO Take by mouth. One daily   Yes [provider]  CALCIUM-MAGNESIUM-ZINC PO Take 1 tablet by mouth 2 (two) times daily.   Yes [provider]  Capsicum, Cayenne, (CAYENNE PEPPER PO) Take 1 capsule by mouth daily.   Yes [provider]  Chromium Aspartate 1000 MCG TABS Take 1,000 mcg by mouth daily.   Yes [provider]  CINNAMON PO Take 2 capsules by mouth daily.   Yes [provider]  DULoxetine (CYMBALTA) 20 MG capsule TAKE ONE CAPSULE BY MOUTH ONCE DAILY 07/13/17  Yes York Spaniel, MD  Ferrous Sulfate (IRON) 325 (65 FE) MG TABS Take 65 mg by mouth daily.   Yes [provider]  fish oil-omega-3 fatty acids 1000 MG capsule Take 1 g by mouth 2 (two) times daily.    Yes [provider]  folic acid (FOLVITE) 400 MCG tablet Take 400 mcg by mouth daily.   Yes [provider]  phenylephrine (SUDAFED PE) 10 MG TABS Take 10 mg by mouth daily as needed (for  ear).    Yes [provider]  Potassium 99 MG TABS Take 99 mg by mouth daily.   Yes [provider]  senna (SENOKOT) 8.6 MG tablet Take 2 tablets by mouth 2 (two) times daily.   Yes [provider]  valACYclovir (VALTREX) 500 MG tablet Take 500 mg by mouth as needed.   Yes [provider]  vitamin B-12 (CYANOCOBALAMIN) 1000 MCG tablet Take 1,000 mcg by mouth daily.   Yes [provider]  vitamin C (ASCORBIC ACID) 500 MG tablet Take 500 mg by mouth daily.   Yes [provider]    ROS:  Out of a complete 14 system review of symptoms, the patient complains only of the following symptoms, and all other reviewed systems are  negative.  Light sensitivity, eye pain  Blood pressure 127/71, pulse 65, height 5\' 3"  (1.6 m).  Physical Exam  General: The patient is alert and cooperative at the time of the examination.  Skin: No significant peripheral edema is noted.   Neurologic Exam  Mental status: The patient is alert and oriented x 3 at the time of the examination. The patient has apparent normal recent and remote memory, with an apparently normal attention span and concentration ability.   Cranial nerves: Facial symmetry is present. Speech is normal, no aphasia or dysarthria is noted. Extraocular movements are full. Visual fields are full.  Motor: The patient has good strength in all 4 extremities.  Sensory examination: Soft touch sensation is symmetric on the face, arms, and legs.  Coordination: The patient has good finger-nose-finger and heel-to-shin bilaterally.  Gait and station: The patient has a normal gait. Tandem gait is unsteady. Romberg is negative. No drift is seen.  Reflexes: Deep tendon reflexes are symmetric.   Assessment/Plan:  1. Cervical spinal stenosis  2. Upper extremity hypersensitivity  The patient is having new symptoms of sensory alterations in both arms, she did have some transient discomfort around the elbow and forearm on both arms that has improved. The patient has evidence of spinal stenosis from a prior scan done 4 years ago, at that time she had deformation of the spinal cord at several levels, but she did not have increased signal in the cord itself. The patient will be sent for a repeat MRI of the cervical spine, blood work will be done today, if there is evidence of progressive changes with spinal stenosis, a surgical referral may be required. She will follow-up in one year otherwise. A prescription for Cymbalta was sent in.  Marlan Palau MD 07/30/2017 2:37 PM  [facility] [address] [place], [place] [address]  Phone  [phone] Fax [phone]

## 2017-07-30 NOTE — Patient Instructions (Signed)
   We will get MRI of the neck. 

## 2017-08-01 LAB — COPPER, SERUM: COPPER: 89 ug/dL (ref 72–166)

## 2017-08-01 LAB — VITAMIN B12: Vitamin B-12: 1433 pg/mL — ABNORMAL HIGH (ref 232–1245)

## 2017-08-03 ENCOUNTER — Telehealth: Payer: Self-pay | Admitting: *Deleted

## 2017-08-03 NOTE — Telephone Encounter (Signed)
-----   Message from York Spaniel, MD sent at 08/02/2017  3:37 PM EDT -----  The blood work results are unremarkable. Please call the patient.  ----- Message ----- From: Nell Range Lab Results In Sent: 07/31/2017   7:43 AM To: York Spaniel, MD

## 2017-08-03 NOTE — Telephone Encounter (Signed)
I  have spoken with Amanda West this morning and reviewed lab results as below.  She verbalized understanding of same/fim

## 2017-08-12 ENCOUNTER — Ambulatory Visit
Admission: RE | Admit: 2017-08-12 | Discharge: 2017-08-12 | Disposition: A | Discharge status: Home / Self Care | Payer: Medicare Other | Source: Ambulatory Visit | Attending: Neurology | Admitting: Neurology

## 2017-08-12 DIAGNOSIS — M4802 Spinal stenosis, cervical region: Secondary | ICD-10-CM

## 2017-08-13 ENCOUNTER — Telehealth: Payer: Self-pay | Admitting: Neurology

## 2017-08-13 DIAGNOSIS — M4712 Other spondylosis with myelopathy, cervical region: Secondary | ICD-10-CM

## 2017-08-13 NOTE — Telephone Encounter (Signed)
I called the patient. MRI of the cervical spine now shows evidence of mild cord signal hyperintensity C4-5 level, there is flattening of the spinal cord at level, the patient has multilevel degenerative changes.  The cord compression likely is the source of the new symptoms of sensory alteration in the forearms and arms.  I recommended a neurosurgical evaluation, the patient is amenable to this.   MRI cervical 08/12/17:  IMPRESSION:  Abnormal MRI cervical spine (without) demonstrating: 1. At C4-5: disc bulging and facet hypertrophy with moderate-severe spinal stenosis, mild right and moderate left foraminal stenosis. Slight cord signal T2 hyperintensity at C4-5 may be due to cord edema or myelomalacia. 2. At C6-7: disc bulging and facet hypertrophy with moderate spinal stenosis and severe biforaminal stenosis. 3. At C5-6: disc bulging and facet hypertrophy with mild spinal stenosis and severe biforaminal stenosis. 4. Compared to MRI on 07/07/13, there has been mild progression of degenerative spine disease.

## 2017-09-03 ENCOUNTER — Other Ambulatory Visit: Payer: Self-pay | Admitting: Neurosurgery

## 2017-09-15 NOTE — Pre-Procedure Instructions (Addendum)
Amanda West  09/15/2017      [facility] [address], [place] - [address] [place] [address] Phone: [phone] Fax: [phone]  [facility] # [address] [place] - [address] Amanda West [place] [address] Phone: [phone] Fax: [phone]    Your procedure is scheduled on Mon. Oct. 15  Report to Amanda West Admitting at 10:40 A.M.  Call this number if you have problems the morning of surgery:  [phone]   Remember:  Do not eat food or drink liquids after midnight on Sun. Oct. 14   Take these medicines the morning of surgery with A SIP OF WATER : none             7 days prior to surgery STOP taking any Aspirin (unless otherwise instructed by your surgeon), Aleve, Naproxen, Ibuprofen, Motrin, Advil, Goody's, BC's, all herbal medications, fish oil, and all vitamins   Do not wear jewelry, make-up or nail polish.  Do not wear lotions, powders, or perfumes, or deoderant.  Do not shave 48 hours prior to surgery.  Men may shave face and neck.  Do not bring valuables to the hospital.  [facility] is not responsible for any belongings or valuables.  Contacts, dentures or bridgework may not be worn into surgery.  Leave your suitcase in the car.  After surgery it may be brought to your room.  For patients admitted to the hospital, discharge time will be determined by your treatment team.  Patients discharged the day of surgery will not be allowed to drive home.    Special instructions:   Amanda West- Preparing For Surgery  Before surgery, you can play an important role. Because skin is not sterile, your skin needs to be as free of germs as possible. You can reduce the number of germs on your skin by washing with CHG (chlorahexidine gluconate) Soap before surgery.  CHG is an antiseptic cleaner which kills germs and bonds with the skin to continue killing germs even after  washing.  Please do not use if you have an allergy to CHG or antibacterial soaps. If your skin becomes reddened/irritated stop using the CHG.  Do not shave (including legs and underarms) for at least 48 hours prior to first CHG shower. It is OK to shave your face.  Please follow these instructions carefully.   1. Shower the NIGHT BEFORE SURGERY and the MORNING OF SURGERY with CHG.   2. If you chose to wash your hair, wash your hair first as usual with your normal shampoo.  3. After you shampoo, rinse your hair and body thoroughly to remove the shampoo.  4. Use CHG as you would any other liquid soap. You can apply CHG directly to the skin and wash gently with a scrungie or a clean washcloth.   5. Apply the CHG Soap to your body ONLY FROM THE NECK DOWN.  Do not use on open wounds or open sores. Avoid contact with your eyes, ears, mouth and genitals (private parts). Wash genitals (private parts) with your normal soap.  USE REGULAR SHAMPOO AND CONDITIONER FOR HAIR USE REGULAR SOAP FOR FACE AND PRIVATE AREA  6. Wash thoroughly, paying special attention to the area where your surgery will be performed.  7. Thoroughly rinse your body with warm water from the neck down.  8. DO NOT shower/wash with your normal soap after using and rinsing off the CHG Soap.  9. Amanda West  yourself dry with a CLEAN TOWEL and WASH CLOTH  10. Wear CLEAN PAJAMAS to bed the night before surgery, wear comfortable clothes the morning of surgery  11. Place CLEAN SHEETS on your bed the night of your first shower and DO NOT SLEEP WITH PETS.    Day of Surgery: Do not apply any deodorants/lotions. Please wear clean clothes to the hospital/surgery West.      Please read over the following fact sheets that you were given. Coughing and Deep Breathing, MRSA Information and Surgical Site Infection Prevention

## 2017-09-16 ENCOUNTER — Encounter (HOSPITAL_COMMUNITY): Payer: Self-pay

## 2017-09-16 ENCOUNTER — Encounter (HOSPITAL_COMMUNITY)
Admission: RE | Admit: 2017-09-16 | Discharge: 2017-09-16 | Disposition: A | Discharge status: Home / Self Care | Payer: Medicare Other | Source: Ambulatory Visit | Attending: Neurosurgery | Admitting: Neurosurgery

## 2017-09-16 DIAGNOSIS — Z01812 Encounter for preprocedural laboratory examination: Secondary | ICD-10-CM | POA: Insufficient documentation

## 2017-09-16 LAB — BASIC METABOLIC PANEL
ANION GAP: 6 (ref 5–15)
BUN: 10 mg/dL (ref 6–20)
CALCIUM: 9.3 mg/dL (ref 8.9–10.3)
CO2: 28 mmol/L (ref 22–32)
Chloride: 103 mmol/L (ref 101–111)
Creatinine, Ser: 0.67 mg/dL (ref 0.44–1.00)
GFR calc non Af Amer: >60 mL/min (ref >60)
Glucose, Bld: 95 mg/dL (ref 65–99)
Potassium: 4.1 mmol/L (ref 3.5–5.1)
Sodium: 137 mmol/L (ref 135–145)

## 2017-09-16 LAB — CBC WITH DIFFERENTIAL/PLATELET
BASOS ABS: 0 10*3/uL (ref 0.0–0.1)
BASOS PCT: 1 %
Eosinophils Absolute: 0.3 10*3/uL (ref 0.0–0.7)
Eosinophils Relative: 8 %
HEMATOCRIT: 34.8 % — AB (ref 36.0–46.0)
HEMOGLOBIN: 11.6 g/dL — AB (ref 12.0–15.0)
Lymphocytes Relative: 37 %
Lymphs Abs: 1.5 10*3/uL (ref 0.7–4.0)
MCH: 33.6 pg (ref 26.0–34.0)
MCHC: 33.3 g/dL (ref 30.0–36.0)
MCV: 100.9 fL — ABNORMAL HIGH (ref 78.0–100.0)
MONOS PCT: 7 %
Monocytes Absolute: 0.3 10*3/uL (ref 0.1–1.0)
NEUTROS ABS: 1.8 10*3/uL (ref 1.7–7.7)
NEUTROS PCT: 47 %
Platelets: 212 10*3/uL (ref 150–400)
RBC: 3.45 MIL/uL — AB (ref 3.87–5.11)
RDW: 11.7 % (ref 11.5–15.5)
WBC: 3.9 10*3/uL — AB (ref 4.0–10.5)

## 2017-09-16 LAB — SURGICAL PCR SCREEN
MRSA, PCR: NEGATIVE
STAPHYLOCOCCUS AUREUS: NEGATIVE

## 2017-09-16 NOTE — Progress Notes (Signed)
PCP: Dr. Mosie Epstein

## 2017-09-18 MED ORDER — DEXAMETHASONE SODIUM PHOSPHATE 10 MG/ML IJ SOLN
10.0000 mg | INTRAMUSCULAR | Status: AC
  Administered 2017-09-21: 10 mg via INTRAVENOUS
  Filled 2017-09-18: qty 1

## 2017-09-18 MED ORDER — CEFAZOLIN SODIUM-DEXTROSE 2-4 GM/100ML-% IV SOLN
2.0000 g | INTRAVENOUS | Status: AC
  Administered 2017-09-21: 2 g via INTRAVENOUS
  Filled 2017-09-18: qty 100

## 2017-09-21 ENCOUNTER — Encounter (HOSPITAL_COMMUNITY)
Admission: RE | Disposition: A | Discharge status: Home / Self Care | Payer: Self-pay | Source: Ambulatory Visit | Attending: Neurosurgery

## 2017-09-21 ENCOUNTER — Inpatient Hospital Stay (HOSPITAL_COMMUNITY): Payer: Medicare Other | Admitting: Anesthesiology

## 2017-09-21 ENCOUNTER — Encounter (HOSPITAL_COMMUNITY): Payer: Self-pay | Admitting: Anesthesiology

## 2017-09-21 ENCOUNTER — Inpatient Hospital Stay (HOSPITAL_COMMUNITY)
Admission: RE | Admit: 2017-09-21 | Discharge: 2017-09-22 | DRG: 472 | Disposition: A | Discharge status: Home / Self Care | Payer: Medicare Other | Source: Ambulatory Visit | Attending: Neurosurgery | Admitting: Neurosurgery

## 2017-09-21 ENCOUNTER — Inpatient Hospital Stay (HOSPITAL_COMMUNITY): Payer: Medicare Other

## 2017-09-21 DIAGNOSIS — M4712 Other spondylosis with myelopathy, cervical region: Principal | ICD-10-CM | POA: Diagnosis present

## 2017-09-21 DIAGNOSIS — Z419 Encounter for procedure for purposes other than remedying health state, unspecified: Secondary | ICD-10-CM

## 2017-09-21 DIAGNOSIS — Z833 Family history of diabetes mellitus: Secondary | ICD-10-CM

## 2017-09-21 DIAGNOSIS — Z87891 Personal history of nicotine dependence: Secondary | ICD-10-CM

## 2017-09-21 DIAGNOSIS — G9520 Unspecified cord compression: Secondary | ICD-10-CM | POA: Diagnosis present

## 2017-09-21 DIAGNOSIS — R531 Weakness: Secondary | ICD-10-CM | POA: Diagnosis present

## 2017-09-21 DIAGNOSIS — Z8249 Family history of ischemic heart disease and other diseases of the circulatory system: Secondary | ICD-10-CM

## 2017-09-21 DIAGNOSIS — M4802 Spinal stenosis, cervical region: Secondary | ICD-10-CM | POA: Diagnosis present

## 2017-09-21 DIAGNOSIS — Z7982 Long term (current) use of aspirin: Secondary | ICD-10-CM

## 2017-09-21 DIAGNOSIS — M810 Age-related osteoporosis without current pathological fracture: Secondary | ICD-10-CM | POA: Diagnosis present

## 2017-09-21 HISTORY — PX: ANTERIOR CERVICAL DECOMP/DISCECTOMY FUSION: SHX1161

## 2017-09-21 SURGERY — ANTERIOR CERVICAL DECOMPRESSION/DISCECTOMY FUSION 3 LEVELS
Anesthesia: General | Site: Spine Cervical

## 2017-09-21 MED ORDER — PSEUDOEPHEDRINE HCL 30 MG PO TABS
30.0000 mg | ORAL_TABLET | Freq: Every day | ORAL | Status: DC
  Filled 2017-09-21: qty 1

## 2017-09-21 MED ORDER — LACTATED RINGERS IV SOLN
INTRAVENOUS | Status: DC
  Administered 2017-09-21 (×2): via INTRAVENOUS

## 2017-09-21 MED ORDER — CEFAZOLIN SODIUM-DEXTROSE 1-4 GM/50ML-% IV SOLN
1.0000 g | Freq: Three times a day (TID) | INTRAVENOUS | Status: AC
  Administered 2017-09-21 – 2017-09-22 (×2): 1 g via INTRAVENOUS
  Filled 2017-09-21 (×2): qty 50

## 2017-09-21 MED ORDER — HYDROCODONE-ACETAMINOPHEN 5-325 MG PO TABS
1.0000 | ORAL_TABLET | ORAL | Status: DC | PRN
  Administered 2017-09-22: 2 via ORAL
  Filled 2017-09-21: qty 2

## 2017-09-21 MED ORDER — HYDROMORPHONE HCL 1 MG/ML IJ SOLN: 0.5000 mg | INTRAMUSCULAR | Status: DC | PRN

## 2017-09-21 MED ORDER — VITAMIN C 500 MG PO TABS
500.0000 mg | ORAL_TABLET | Freq: Every day | ORAL | Status: DC
  Filled 2017-09-21 (×2): qty 1

## 2017-09-21 MED ORDER — THROMBIN 20000 UNITS EX KIT
PACK | CUTANEOUS | Status: DC | PRN
  Administered 2017-09-21: 20 mL via TOPICAL

## 2017-09-21 MED ORDER — PHENOL 1.4 % MT LIQD: 1.0000 | OROMUCOSAL | Status: DC | PRN

## 2017-09-21 MED ORDER — POTASSIUM 99 MG PO TABS: 99.0000 mg | ORAL_TABLET | Freq: Every day | ORAL | Status: DC

## 2017-09-21 MED ORDER — HYDROMORPHONE HCL 1 MG/ML IJ SOLN
0.2500 mg | INTRAMUSCULAR | Status: DC | PRN
  Administered 2017-09-21 (×2): 0.5 mg via INTRAVENOUS

## 2017-09-21 MED ORDER — CHLORHEXIDINE GLUCONATE CLOTH 2 % EX PADS: 6.0000 | MEDICATED_PAD | Freq: Once | CUTANEOUS | Status: DC

## 2017-09-21 MED ORDER — FENTANYL CITRATE (PF) 100 MCG/2ML IJ SOLN
INTRAMUSCULAR | Status: DC | PRN
  Administered 2017-09-21 (×2): 25 ug via INTRAVENOUS
  Administered 2017-09-21 (×2): 50 ug via INTRAVENOUS

## 2017-09-21 MED ORDER — VITAMIN B-12 1000 MCG PO TABS
1000.0000 ug | ORAL_TABLET | Freq: Every day | ORAL | Status: DC
  Administered 2017-09-21: 1000 ug via ORAL
  Filled 2017-09-21 (×2): qty 1

## 2017-09-21 MED ORDER — THROMBIN 20000 UNITS EX KIT
PACK | CUTANEOUS | Status: AC
  Filled 2017-09-21: qty 1

## 2017-09-21 MED ORDER — THROMBIN 5000 UNITS EX SOLR
OROMUCOSAL | Status: DC | PRN
  Administered 2017-09-21: 5 mL via TOPICAL

## 2017-09-21 MED ORDER — 0.9 % SODIUM CHLORIDE (POUR BTL) OPTIME
TOPICAL | Status: DC | PRN
  Administered 2017-09-21: 1000 mL

## 2017-09-21 MED ORDER — SODIUM CHLORIDE 0.9% FLUSH
3.0000 mL | Freq: Two times a day (BID) | INTRAVENOUS | Status: DC
  Administered 2017-09-21: 3 mL via INTRAVENOUS

## 2017-09-21 MED ORDER — LIDOCAINE HCL 4 % EX SOLN: CUTANEOUS | Status: DC | PRN

## 2017-09-21 MED ORDER — FOLIC ACID 0.5 MG HALF TAB
500.0000 ug | ORAL_TABLET | Freq: Every day | ORAL | Status: DC
  Administered 2017-09-21: 0.5 mg via ORAL
  Filled 2017-09-21 (×2): qty 1

## 2017-09-21 MED ORDER — ROCURONIUM BROMIDE 100 MG/10ML IV SOLN
INTRAVENOUS | Status: DC | PRN
  Administered 2017-09-21: 40 mg via INTRAVENOUS
  Administered 2017-09-21: 10 mg via INTRAVENOUS

## 2017-09-21 MED ORDER — ONDANSETRON HCL 4 MG/2ML IJ SOLN
INTRAMUSCULAR | Status: DC | PRN
  Administered 2017-09-21: 4 mg via INTRAVENOUS

## 2017-09-21 MED ORDER — VALACYCLOVIR HCL 500 MG PO TABS
500.0000 mg | ORAL_TABLET | Freq: Every day | ORAL | Status: DC | PRN
  Filled 2017-09-21: qty 1

## 2017-09-21 MED ORDER — CHROMIUM ASPARTATE 1000 MCG PO TABS: 1000.0000 ug | ORAL_TABLET | Freq: Every day | ORAL | Status: DC

## 2017-09-21 MED ORDER — SUGAMMADEX SODIUM 200 MG/2ML IV SOLN
INTRAVENOUS | Status: DC | PRN
  Administered 2017-09-21: 100 mg via INTRAVENOUS

## 2017-09-21 MED ORDER — SENNA 8.6 MG PO TABS
5.0000 | ORAL_TABLET | Freq: Every day | ORAL | Status: DC
  Administered 2017-09-21: 43 mg via ORAL
  Filled 2017-09-21: qty 5

## 2017-09-21 MED ORDER — MENTHOL 3 MG MT LOZG: 1.0000 | LOZENGE | OROMUCOSAL | Status: DC | PRN

## 2017-09-21 MED ORDER — PROPOFOL 10 MG/ML IV BOLUS
INTRAVENOUS | Status: DC | PRN
  Administered 2017-09-21: 120 mg via INTRAVENOUS

## 2017-09-21 MED ORDER — OMEGA-3-ACID ETHYL ESTERS 1 G PO CAPS
1000.0000 mg | ORAL_CAPSULE | Freq: Two times a day (BID) | ORAL | Status: DC
  Administered 2017-09-21: 1000 mg via ORAL
  Filled 2017-09-21 (×2): qty 1

## 2017-09-21 MED ORDER — SODIUM CHLORIDE 0.9 % IR SOLN
Status: DC | PRN
Start: 2017-09-21 — End: 2017-09-21
  Administered 2017-09-21: 500 mL

## 2017-09-21 MED ORDER — PHENYLEPHRINE HCL 10 MG PO TABS
10.0000 mg | ORAL_TABLET | Freq: Every day | ORAL | Status: DC
  Filled 2017-09-21: qty 1

## 2017-09-21 MED ORDER — MEPERIDINE HCL 25 MG/ML IJ SOLN: 6.2500 mg | INTRAMUSCULAR | Status: DC | PRN

## 2017-09-21 MED ORDER — ONDANSETRON HCL 4 MG/2ML IJ SOLN
4.0000 mg | Freq: Four times a day (QID) | INTRAMUSCULAR | Status: DC | PRN
  Administered 2017-09-21: 4 mg via INTRAVENOUS
  Filled 2017-09-21: qty 2

## 2017-09-21 MED ORDER — PHENYLEPHRINE HCL 10 MG/ML IJ SOLN
INTRAVENOUS | Status: DC | PRN
  Administered 2017-09-21: 50 ug/min via INTRAVENOUS

## 2017-09-21 MED ORDER — MIDAZOLAM HCL 5 MG/5ML IJ SOLN
INTRAMUSCULAR | Status: DC | PRN
  Administered 2017-09-21: 2 mg via INTRAVENOUS

## 2017-09-21 MED ORDER — DULOXETINE HCL 20 MG PO CPEP
20.0000 mg | ORAL_CAPSULE | Freq: Every day | ORAL | Status: DC
  Filled 2017-09-21: qty 1

## 2017-09-21 MED ORDER — METOCLOPRAMIDE HCL 5 MG/ML IJ SOLN: 10.0000 mg | Freq: Once | INTRAMUSCULAR | Status: DC | PRN

## 2017-09-21 MED ORDER — CYCLOBENZAPRINE HCL 10 MG PO TABS
10.0000 mg | ORAL_TABLET | Freq: Three times a day (TID) | ORAL | Status: DC | PRN
  Administered 2017-09-21: 10 mg via ORAL
  Filled 2017-09-21: qty 1

## 2017-09-21 MED ORDER — ONDANSETRON HCL 4 MG PO TABS: 4.0000 mg | ORAL_TABLET | Freq: Four times a day (QID) | ORAL | Status: DC | PRN

## 2017-09-21 MED ORDER — LIDOCAINE HCL (CARDIAC) 20 MG/ML IV SOLN
INTRAVENOUS | Status: DC | PRN
  Administered 2017-09-21: 100 mg via INTRATRACHEAL

## 2017-09-21 MED ORDER — SODIUM CHLORIDE 0.9% FLUSH: 3.0000 mL | INTRAVENOUS | Status: DC | PRN

## 2017-09-21 MED ORDER — HYDROMORPHONE HCL 1 MG/ML IJ SOLN
INTRAMUSCULAR | Status: AC
  Administered 2017-09-21: 0.5 mg via INTRAVENOUS
  Filled 2017-09-21: qty 1

## 2017-09-21 MED ORDER — FERROUS SULFATE 325 (65 FE) MG PO TABS: 325.0000 mg | ORAL_TABLET | Freq: Every day | ORAL | Status: DC

## 2017-09-21 SURGICAL SUPPLY — 59 items
BAG DECANTER FOR FLEXI CONT (MISCELLANEOUS) ×2 IMPLANT
BENZOIN TINCTURE PRP APPL 2/3 (GAUZE/BANDAGES/DRESSINGS) ×2 IMPLANT
BIT DRILL 13 (BIT) ×2 IMPLANT
BUR MATCHSTICK NEURO 3.0 LAGG (BURR) ×2 IMPLANT
CAGE PEEK 6X14X11 (Cage) ×3 IMPLANT
CAGE SPNL 11X14X6XRADOPQ (Cage) ×1 IMPLANT
CANISTER SUCT 3000ML PPV (MISCELLANEOUS) ×2 IMPLANT
CARTRIDGE OIL MAESTRO DRILL (MISCELLANEOUS) ×1 IMPLANT
DIFFUSER DRILL AIR PNEUMATIC (MISCELLANEOUS) ×2 IMPLANT
DRAPE C-ARM 42X72 X-RAY (DRAPES) ×4 IMPLANT
DRAPE LAPAROTOMY 100X72 PEDS (DRAPES) ×2 IMPLANT
DRAPE MICROSCOPE LEICA (MISCELLANEOUS) ×2 IMPLANT
DRAPE POUCH INSTRU U-SHP 10X18 (DRAPES) ×2 IMPLANT
DURAPREP 6ML APPLICATOR 50/CS (WOUND CARE) ×2 IMPLANT
ELECT COATED BLADE 2.86 ST (ELECTRODE) ×2 IMPLANT
ELECT REM PT RETURN 9FT ADLT (ELECTROSURGICAL) ×2
ELECTRODE REM PT RTRN 9FT ADLT (ELECTROSURGICAL) ×1 IMPLANT
GAUZE SPONGE 4X4 12PLY STRL (GAUZE/BANDAGES/DRESSINGS) ×2 IMPLANT
GAUZE SPONGE 4X4 16PLY XRAY LF (GAUZE/BANDAGES/DRESSINGS) IMPLANT
GLOVE BIO SURGEON STRL SZ8 (GLOVE) ×2 IMPLANT
GLOVE BIO SURGEON STRL SZ8.5 (GLOVE) ×2 IMPLANT
GLOVE BIOGEL PI IND STRL 7.0 (GLOVE) ×1 IMPLANT
GLOVE BIOGEL PI INDICATOR 7.0 (GLOVE) ×1
GLOVE ECLIPSE 9.0 STRL (GLOVE) ×2 IMPLANT
GLOVE EXAM NITRILE LRG STRL (GLOVE) IMPLANT
GLOVE EXAM NITRILE XL STR (GLOVE) IMPLANT
GLOVE EXAM NITRILE XS STR PU (GLOVE) IMPLANT
GLOVE SURG SS PI 7.5 STRL IVOR (GLOVE) ×4 IMPLANT
GOWN STRL REUS W/ TWL LRG LVL3 (GOWN DISPOSABLE) ×1 IMPLANT
GOWN STRL REUS W/ TWL XL LVL3 (GOWN DISPOSABLE) ×2 IMPLANT
GOWN STRL REUS W/TWL 2XL LVL3 (GOWN DISPOSABLE) IMPLANT
GOWN STRL REUS W/TWL LRG LVL3 (GOWN DISPOSABLE) ×1
GOWN STRL REUS W/TWL XL LVL3 (GOWN DISPOSABLE) ×2
HALTER HD/CHIN CERV TRACTION D (MISCELLANEOUS) ×2 IMPLANT
HEMOSTAT POWDER SURGIFOAM 1G (HEMOSTASIS) ×2 IMPLANT
KIT BASIN OR (CUSTOM PROCEDURE TRAY) ×2 IMPLANT
KIT ROOM TURNOVER OR (KITS) ×2 IMPLANT
NEEDLE SPNL 20GX3.5 QUINCKE YW (NEEDLE) ×2 IMPLANT
NS IRRIG 1000ML POUR BTL (IV SOLUTION) ×2 IMPLANT
OIL CARTRIDGE MAESTRO DRILL (MISCELLANEOUS) ×2
PACK LAMINECTOMY NEURO (CUSTOM PROCEDURE TRAY) ×2 IMPLANT
PAD ARMBOARD 7.5X6 YLW CONV (MISCELLANEOUS) ×6 IMPLANT
PLATE 3 57.5XLCK NS SPNE CVD (Plate) ×1 IMPLANT
PLATE 3 ATLANTIS TRANS (Plate) ×1 IMPLANT
RUBBERBAND STERILE (MISCELLANEOUS) ×4 IMPLANT
SCREW ST FIX 4 ATL 3120213 (Screw) ×16 IMPLANT
SPACER SPNL 11X14X6XPEEK CVD (Cage) ×2 IMPLANT
SPCR SPNL 11X14X6XPEEK CVD (Cage) ×2 IMPLANT
SPONGE INTESTINAL PEANUT (DISPOSABLE) ×4 IMPLANT
SPONGE SURGIFOAM ABS GEL 100 (HEMOSTASIS) ×2 IMPLANT
STRIP CLOSURE SKIN 1/2X4 (GAUZE/BANDAGES/DRESSINGS) ×2 IMPLANT
SUT VIC AB 3-0 SH 8-18 (SUTURE) ×2 IMPLANT
SUT VIC AB 4-0 RB1 18 (SUTURE) ×4 IMPLANT
TAPE CLOTH 4X10 WHT NS (GAUZE/BANDAGES/DRESSINGS) ×2 IMPLANT
TAPE CLOTH SURG 4X10 WHT LF (GAUZE/BANDAGES/DRESSINGS) ×2 IMPLANT
TOWEL GREEN STERILE (TOWEL DISPOSABLE) ×2 IMPLANT
TOWEL GREEN STERILE FF (TOWEL DISPOSABLE) ×2 IMPLANT
TRAP SPECIMEN MUCOUS 40CC (MISCELLANEOUS) ×2 IMPLANT
WATER STERILE IRR 1000ML POUR (IV SOLUTION) ×2 IMPLANT

## 2017-09-21 NOTE — Transfer of Care (Cosign Needed)
Immediate Anesthesia Transfer of Care Note  Patient: Amanda West  Procedure(s) Performed: Anterior Cervical Decompression Fusion Cervical Four-Five,Cervical Five-Six,Cervical Six-Seven (N/A Spine Cervical)  Patient Location: PACU  Anesthesia Type:General  Level of Consciousness: awake, alert , oriented and patient cooperative  Airway & Oxygen Therapy: Patient Spontanous Breathing and Patient connected to nasal cannula oxygen  Post-op Assessment: Report given to RN, Post -op Vital signs reviewed and stable and Patient moving all extremities X 4  Post vital signs: Reviewed and stable  Last Vitals:  Vitals:   09/21/17 1037  BP: 136/75  Pulse: (!) 50  Resp: 18  Temp: 36.6 C  SpO2: 99%    Last Pain: There were no vitals filed for this visit.    Patients Stated Pain Goal: 6 (09/21/17 1051)  Complications: No apparent anesthesia complications

## 2017-09-21 NOTE — Anesthesia Preprocedure Evaluation (Addendum)
Anesthesia Evaluation  Patient identified by MRN, date of birth, ID band Patient awake    Reviewed: Allergy & Precautions, NPO status , Patient's Chart, lab work & pertinent test results  Airway Mallampati: II  TM Distance: >3 FB Neck ROM: Full    Dental no notable dental hx. (+) Teeth Intact   Pulmonary former smoker,    Pulmonary exam normal breath sounds clear to auscultation       Cardiovascular negative cardio ROS Normal cardiovascular exam Rhythm:Regular Rate:Normal     Neuro/Psych Hx/o shingles negative psych ROS   GI/Hepatic Neg liver ROS, hiatal hernia,   Endo/Other  negative endocrine ROS  Renal/GU negative Renal ROS  negative genitourinary   Musculoskeletal  (+) Arthritis , Cervical spinal stenosis C4-5, C5-6,  C6-7   Abdominal   Peds  Hematology  (+) anemia ,   Anesthesia Other Findings   Reproductive/Obstetrics                             Anesthesia Physical Anesthesia Plan  ASA: I  Anesthesia Plan: General   Post-op Pain Management:    Induction: Intravenous  PONV Risk Score and Plan: 4 or greater and Ondansetron, Dexamethasone, Midazolam, Propofol infusion, Metaclopromide and Treatment may vary due to age or medical condition  Airway Management Planned: Oral ETT and Natural Airway  Additional Equipment:   Intra-op Plan:   Post-operative Plan: Extubation in OR  Informed Consent: I have reviewed the patients History and Physical, chart, labs and discussed the procedure including the risks, benefits and alternatives for the proposed anesthesia with the patient or authorized representative who has indicated his/her understanding and acceptance.   Dental advisory given  Plan Discussed with: CRNA, Anesthesiologist and Surgeon  Anesthesia Plan Comments:         Anesthesia Quick Evaluation

## 2017-09-21 NOTE — H&P (Signed)
Amanda West is an 71 y.o. female.   Chief Complaint: weakness HPI: patient is a 71 year old female with progressive upper and lower extremity weakness and gait abnormality. Workup demonstrates evidence of a worsening degree of cervical spondylosis and resultant stenosis. Patient has marked cord compression with cord signal laterality at C4-5. At C5-6 and C6-7 the patient is evidence of moderately severe stenosis. Patient presents now for three-level anterior cervical decompression and fusion.  Past Medical History:  Diagnosis Date  . Abnormality of gait 07/22/2013  . Anemia   . H/O hiatal hernia   . Osteoporosis   . Shingles   . Spinal stenosis in cervical region 09/15/2014  . Vitamin D deficiency     Past Surgical History:  Procedure Laterality Date  . CATARACT EXTRACTION    . Hertial Hernia    . PARS PLANA VITRECTOMY  07/06/2012   Procedure: PARS PLANA VITRECTOMY WITH 25G REMOVAL/SUTURE INTRAOCULAR LENS;  Surgeon: Sherrie George, MD;  [facility] OR;  Service: Ophthalmology;  Laterality: Left;    Family History  Problem Relation Age of Onset  . Heart disease Mother   . Heart disease Father   . Diabetes Sister   . Cancer Brother   . Heart disease Brother    Social History:  reports that she quit smoking about 3 years ago. She quit after 30.00 years of use. She has never used smokeless tobacco. She reports that she does not drink alcohol or use drugs.  Allergies:  Allergies  Allergen Reactions  . Codeine Nausea Only  . Nortriptyline     Blurred vision    Medications Prior to Admission  Medication Sig Dispense Refill  . aspirin EC 81 MG tablet Take 81 mg by mouth daily with supper.    Marland Kitchen CALCIUM-MAGNESIUM-ZINC PO Take 1 tablet by mouth 2 (two) times daily.    . Capsicum, Cayenne, (CAYENNE PEPPER PO) Take 1,000 mg by mouth daily.     . Chromium Aspartate 1000 MCG TABS Take 1,000 mcg by mouth daily with supper.     Marland Kitchen CINNAMON PO Take 1,000 mg by mouth 2 (two) times daily.      . DULoxetine (CYMBALTA) 20 MG capsule Take 1 capsule (20 mg total) by mouth daily. (Patient taking differently: Take 20 mg by mouth at bedtime. ) 90 capsule 3  . Ferrous Sulfate (IRON) 325 (65 FE) MG TABS Take 325 mg by mouth daily with supper.     . fish oil-omega-3 fatty acids 1000 MG capsule Take 1,000 mg by mouth 2 (two) times daily. In the morning & with supper    . folic acid (FOLVITE) 400 MCG tablet Take 400 mcg by mouth daily with supper.     . phenylephrine (SUDAFED PE) 10 MG TABS Take 10 mg by mouth daily.     Bertram Gala Glyc-Propyl Glyc PF (SYSTANE PRESERVATIVE FREE) 0.4-0.3 % SOLN Place 1-2 drops into both eyes 3 (three) times daily as needed (for dry/irritated eyes.).    Marland Kitchen Potassium 99 MG TABS Take 99 mg by mouth daily.    Marland Kitchen senna (SENOKOT) 8.6 MG tablet Take 5 tablets by mouth at bedtime.     . valACYclovir (VALTREX) 500 MG tablet Take 500 mg by mouth daily as needed (for eye infection).     . vitamin B-12 (CYANOCOBALAMIN) 1000 MCG tablet Take 1,000 mcg by mouth daily.    . vitamin C (ASCORBIC ACID) 500 MG tablet Take 500 mg by mouth daily with supper.  No results found for this or any previous visit (from the past 48 hour(s)). No results found.  Pertinent items noted in HPI and remainder of comprehensive ROS otherwise negative.  Blood pressure 136/75, pulse (!) 50, temperature 97.9 F (36.6 C), resp. rate 18, SpO2 99 %.  Patient is awake and alert. She is oriented and appropriate. Examination of her head ears eyes and throat are marked. Chest and abdomen are benign. Extremities are free from injury deformity. Speech is fluent. Judgment and insight are intact. Cranial nerve function normal bilaterally. Motor examination reveals moderate diffuse weakness in both distal upper extremities. She has mild spasticity in both lower extremities. Sensory examination with relative sensory loss from C5 distally. Deep tendon reflexes are brisk. Hoffmann's responses are present in both  hands. Assessment/Plan Cervical stenosis with myelopathy. Plan C4-5, C5-6, C6-7 anterior cervical discectomy and fusion. Risks and benefits of been explained. Patient wishes to proceed.  Keisa Blow A 09/21/2017, 12:36 PM

## 2017-09-21 NOTE — Op Note (Signed)
Date of procedure: 09/21/2017  Date of dictation: same  Service: Neurosurgery  Preoperative diagnosis: C4-5, C5-6, C6-7 spondylosis with stenosis and myelopathy  Postoperative diagnosis: same  Procedure Name: C4-5, C5-6, C6-7 anterior cervical discectomy with interbody fusion utilizing interbody cages, local harvested autograft, and anterior plate instrumentation  Surgeon:Odies Desa A.Jaleya Pebley, M.D.  Asst. Surgeon: Lovell Sheehan  Anesthesia: General  Indication:71 year old female with progressive cervical myelopathy secondary to cervical stenosis with cord compression. Patient presents now for three-level anterior cervical decompression and fusion in hopes of improving her symptoms.  Operative note:after induction of anesthesia, patient positioned supine with Extended and held in place of halter traction. Anterior surgery  Draped sterilely. Incision made overlying C5-6. Dissection performed on the right. Retractor placed. Fluoroscopy used. Levels confirm. Disc spaces at all 3 levels in size. Discectomies performed using various instruments to level of the posterior annulus. Casimiro Needle brought to the field used throughout the remainder of the discectomy. Straining aspects annulus and osteophytes removed using a high-speed drill down to the level of the posterior wall chilblain. Posterior lateral was elevated and resected and he's no fashion. Underlying thecal sac was then identified. Wide central decompression was then performed undercutting the bodies of C4 and C5. Decompression then proceeded reached the foramen. Widely care foraminotomies performed on the core 60 C5 directed bilaterally. At this point a very thorough decompression had been achieved. There was no evidence injury to the thecal sac or nerve roots. Procedure then repeated at C5-6 and C6-7 again without complication or incident. Wound was irrigated with solution. Gelfoam was placed operative hemostasis then removed. Medtronic anatomic peek cages  were packed with locally harvested autograft. These are then packed into place and L3 levels. Each cage was recessed slightly anterior cortical margin. Medtronic anteriorcervical plate was then placed over the C4-C7 levels. This infection for scopic guidance using 13 L fixed angle screws to each at all 4 levels. All 8 screws were given a final tightening from the solid and the bone. Locking screws engaged L4 levels. Final images revealed good position the cages and the rotation of the proper level with normal alignment spine. Wound was then irrigated one final time. Hemostasis was assured with bipolar cautery. Wounds and closed in layers. Steri-Strips are dressing were applied. No complications. Patient tolerated the procedure well and she returned circumflex of her postoperative

## 2017-09-21 NOTE — Brief Op Note (Signed)
09/21/2017  3:07 PM  PATIENT:  Amanda West  71 y.o. female  PRE-OPERATIVE DIAGNOSIS:  Stenosis  POST-OPERATIVE DIAGNOSIS:  Stenosis  PROCEDURE:  Procedure(s) with comments: Anterior Cervical Decompression Fusion Cervical Four-Five,Cervical Five-Six,Cervical Six-Seven (N/A) - anterior   SURGEON:  Surgeon(s) and Role:    * Julio Sicks, MD - Primary    * Tressie Stalker, MD - Assisting  PHYSICIAN ASSISTANT:   ASSISTANTS:    ANESTHESIA:   general  EBL:  Total I/O In: -  Out: 200 [Blood:200]  BLOOD ADMINISTERED:none  DRAINS: none   LOCAL MEDICATIONS USED:  NONE  SPECIMEN:  No Specimen  DISPOSITION OF SPECIMEN:  N/A  COUNTS:  YES  TOURNIQUET:  * No tourniquets in log *  DICTATION: .Dragon Dictation  PLAN OF CARE: Admit to inpatient   PATIENT DISPOSITION:  PACU - hemodynamically stable.   Delay start of Pharmacological VTE agent (>24hrs) due to surgical blood loss or risk of bleeding: yes

## 2017-09-21 NOTE — Anesthesia Procedure Notes (Signed)
Procedure Name: Intubation Date/Time: 09/21/2017 1:03 PM Performed by: Laretta Alstrom Pre-anesthesia Checklist: Patient identified, Emergency Drugs available, Suction available and Patient being monitored Patient Re-evaluated:Patient Re-evaluated prior to induction Oxygen Delivery Method: Circle system utilized Preoxygenation: Pre-oxygenation with 100% oxygen Induction Type: IV induction Ventilation: Mask ventilation without difficulty Laryngoscope Size: Mac and 3 Grade View: Grade I Tube type: Oral Tube size: 7.0 mm Number of attempts: 1 Airway Equipment and Method: Stylet Placement Confirmation: ETT inserted through vocal cords under direct vision,  positive ETCO2 and breath sounds checked- equal and bilateral Secured at: 21 cm Tube secured with: Tape Dental Injury: Teeth and Oropharynx as per pre-operative assessment  Comments: Performed by M. Petraglia, SRNA

## 2017-09-22 ENCOUNTER — Encounter (HOSPITAL_COMMUNITY): Payer: Self-pay | Admitting: Neurosurgery

## 2017-09-22 MED ORDER — HYDROCODONE-ACETAMINOPHEN 5-325 MG PO TABS: 1.0000 | ORAL_TABLET | ORAL | 0 refills | Status: DC | PRN

## 2017-09-22 MED ORDER — OMEGA-3-ACID ETHYL ESTERS 1 G PO CAPS
1000.0000 mg | ORAL_CAPSULE | Freq: Two times a day (BID) | ORAL | Status: DC
  Filled 2017-09-22 (×2): qty 1

## 2017-09-22 MED ORDER — CYCLOBENZAPRINE HCL 10 MG PO TABS: 10.0000 mg | ORAL_TABLET | Freq: Three times a day (TID) | ORAL | 0 refills | Status: DC | PRN

## 2017-09-22 NOTE — Discharge Summary (Addendum)
Physician Discharge Summary  Patient ID: Amanda West MRN: [id number] DOB/AGE: [birth year] 72 y.o.  Admit date: 09/21/2017 Discharge date: 09/22/2017  Admission Diagnoses:  Discharge Diagnoses:  Active Problems:   Cervical spinal stenosis   Discharged Condition: good  Hospital Course: patient admitted to hospital where she underwent an uncomplicated 3 level anterior cervical decompression and fusion. Postoperatively doing very well. Preoperative pain and weakness much improved. Ambulating without difficulty. Vertebra discharge home.  Consults:   Significant Diagnostic Studies:   Treatments:   Discharge Exam: Blood pressure (!) 105/58, pulse 71, temperature 98.4 F (36.9 C), resp. rate 16, SpO2 97 %.  Awake and alert. Oriented and appropriate. Cranial nerve function intact. Motor and sensory function extremities normal. Wound clean and dry. Chest and abdomen benign. Disposition: 01-Home or Self Care   Allergies as of 09/22/2017      Reactions   Codeine Nausea Only   Nortriptyline    Blurred vision      Medication List    TAKE these medications   aspirin EC 81 MG tablet Take 81 mg by mouth daily with supper.   CALCIUM-MAGNESIUM-ZINC PO Take 1 tablet by mouth 2 (two) times daily.   CAYENNE PEPPER PO Take 1,000 mg by mouth daily.   Chromium Aspartate 1000 MCG Tabs Take 1,000 mcg by mouth daily with supper.   CINNAMON PO Take 1,000 mg by mouth 2 (two) times daily.   cyclobenzaprine 10 MG tablet Commonly known as:  FLEXERIL Take 1 tablet (10 mg total) by mouth 3 (three) times daily as needed for muscle spasms.   DULoxetine 20 MG capsule Commonly known as:  CYMBALTA Take 1 capsule (20 mg total) by mouth daily. What changed:  when to take this   fish oil-omega-3 fatty acids 1000 MG capsule Take 1,000 mg by mouth 2 (two) times daily. In the morning & with supper   folic acid 400 MCG tablet Commonly known as:  FOLVITE Take 400 mcg by mouth daily with  supper.   HYDROcodone-acetaminophen 5-325 MG tablet Commonly known as:  NORCO/VICODIN Take 1-2 tablets by mouth every 4 (four) hours as needed (breakthrough pain).   Iron 325 (65 Fe) MG Tabs Take 325 mg by mouth daily with supper.   phenylephrine 10 MG Tabs tablet Commonly known as:  SUDAFED PE Take 10 mg by mouth daily.   Potassium 99 MG Tabs Take 99 mg by mouth daily.   SENOKOT 8.6 MG tablet Generic drug:  senna Take 5 tablets by mouth at bedtime.   SYSTANE PRESERVATIVE FREE 0.4-0.3 % Soln Generic drug:  Polyethyl Glyc-Propyl Glyc PF Place 1-2 drops into both eyes 3 (three) times daily as needed (for dry/irritated eyes.).   valACYclovir 500 MG tablet Commonly known as:  VALTREX Take 500 mg by mouth daily as needed (for eye infection).   vitamin B-12 1000 MCG tablet Commonly known as:  CYANOCOBALAMIN Take 1,000 mcg by mouth daily.   vitamin C 500 MG tablet Commonly known as:  ASCORBIC ACID Take 500 mg by mouth daily with supper.        Signed: Danial Sisley A 09/22/2017, 9:42 AM

## 2017-09-22 NOTE — Discharge Instructions (Signed)

## 2017-09-22 NOTE — Progress Notes (Signed)
Patient alert and oriented, mae's well, voiding adequate amount of urine, swallowing without difficulty, no c/o pain at time of discharge. Patient discharged home with family. Script and discharged instructions given to patient. Patient and family stated understanding of instructions given. Patient has an appointment with Dr. Pool  

## 2017-10-05 NOTE — Anesthesia Postprocedure Evaluation (Signed)
Anesthesia Post Note  Patient: Lillie ColumbiaMartha Martenson  Procedure(s) Performed: Anterior Cervical Decompression Fusion Cervical Four-Five,Cervical Five-Six,Cervical Six-Seven (N/A Spine Cervical)     Patient location during evaluation: PACU Anesthesia Type: General Level of consciousness: awake and alert Pain management: pain level controlled Vital Signs Assessment: post-procedure vital signs reviewed and stable Respiratory status: spontaneous breathing, nonlabored ventilation, respiratory function stable and patient connected to nasal cannula oxygen Cardiovascular status: blood pressure returned to baseline and stable Postop Assessment: no apparent nausea or vomiting Anesthetic complications: no    Last Vitals:  Vitals:   09/22/17 0330 09/22/17 0738  BP: 107/71 (!) 105/58  Pulse: 73 71  Resp: 16 16  Temp: 36.6 C 36.9 C  SpO2: 98% 97%    Last Pain:  Vitals:   09/22/17 0400  TempSrc:   PainSc: 3                  Shelton SilvasKevin D Kaylin Schellenberg

## 2018-01-27 ENCOUNTER — Other Ambulatory Visit: Payer: Self-pay | Admitting: Family Medicine

## 2018-01-27 DIAGNOSIS — Z1231 Encounter for screening mammogram for malignant neoplasm of breast: Secondary | ICD-10-CM

## 2018-02-16 ENCOUNTER — Ambulatory Visit
Admission: RE | Admit: 2018-02-16 | Discharge: 2018-02-16 | Disposition: A | Discharge status: Home / Self Care | Payer: Medicare Other | Source: Ambulatory Visit | Attending: Family Medicine | Admitting: Family Medicine

## 2018-02-16 DIAGNOSIS — Z1231 Encounter for screening mammogram for malignant neoplasm of breast: Secondary | ICD-10-CM

## 2018-03-01 ENCOUNTER — Ambulatory Visit (INDEPENDENT_AMBULATORY_CARE_PROVIDER_SITE_OTHER): Payer: Medicare Other | Admitting: Ophthalmology

## 2018-03-01 DIAGNOSIS — H35371 Puckering of macula, right eye: Secondary | ICD-10-CM

## 2018-03-01 DIAGNOSIS — H2702 Aphakia, left eye: Secondary | ICD-10-CM | POA: Diagnosis not present

## 2018-08-02 ENCOUNTER — Ambulatory Visit: Payer: Medicare Other | Admitting: Adult Health

## 2018-08-26 ENCOUNTER — Ambulatory Visit: Payer: Medicare Other | Admitting: Adult Health

## 2018-08-26 ENCOUNTER — Encounter: Payer: Self-pay | Admitting: Adult Health

## 2018-08-26 VITALS — BP 119/69 | HR 64 | Ht 63.0 in | Wt 122.0 lb

## 2018-08-26 DIAGNOSIS — M4802 Spinal stenosis, cervical region: Secondary | ICD-10-CM | POA: Diagnosis not present

## 2018-08-26 MED ORDER — GABAPENTIN 100 MG PO CAPS: 100.0000 mg | ORAL_CAPSULE | Freq: Every day | ORAL | 11 refills | Status: DC

## 2018-08-26 NOTE — Patient Instructions (Signed)
Your Plan:  Continue Cymbalta Start gabapentin 100 mg daily. Can increase to twice a day if needed If your symptoms worsen or you develop new symptoms please let us know.   Thank you for coming to see us at [facility]. I hope we have been able to provide you high quality care today.  You may receive a patient satisfaction survey over the next few weeks. We would appreciate your feedback and comments so that we may continue to improve ourselves and the health of our patients.  Gabapentin capsules or tablets What is this medicine? GABAPENTIN (GA ba pen tin) is used to control partial seizures in adults with epilepsy. It is also used to treat certain types of nerve pain. This medicine may be used for other purposes; ask your health care provider or pharmacist if you have questions. COMMON BRAND NAME(S): Active-PAC with Gabapentin, Gabarone, Neurontin What should I tell my health care provider before I take this medicine? They need to know if you have any of these conditions: -kidney disease -suicidal thoughts, plans, or attempt; a previous suicide attempt by you or a family member -an unusual or allergic reaction to gabapentin, other medicines, foods, dyes, or preservatives -pregnant or trying to get pregnant -breast-feeding How should I use this medicine? Take this medicine by mouth with a glass of water. Follow the directions on the prescription label. You can take it with or without food. If it upsets your stomach, take it with food.Take your medicine at regular intervals. Do not take it more often than directed. Do not stop taking except on your doctor's advice. If you are directed to break the 600 or 800 mg tablets in half as part of your dose, the extra half tablet should be used for the next dose. If you have not used the extra half tablet within 28 days, it should be thrown away. A special MedGuide will be given to you by the pharmacist with each prescription and  refill. Be sure to read this information carefully each time. Talk to your pediatrician regarding the use of this medicine in children. Special care may be needed. Overdosage: If you think you have taken too much of this medicine contact a poison control center or emergency room at once. NOTE: This medicine is only for you. Do not share this medicine with others. What if I miss a dose? If you miss a dose, take it as soon as you can. If it is almost time for your next dose, take only that dose. Do not take double or extra doses. What may interact with this medicine? Do not take this medicine with any of the following medications: -other gabapentin products This medicine may also interact with the following medications: -alcohol -antacids -antihistamines for allergy, cough and cold -certain medicines for anxiety or sleep -certain medicines for depression or psychotic disturbances -homatropine; hydrocodone -naproxen -narcotic medicines (opiates) for pain -phenothiazines like chlorpromazine, mesoridazine, prochlorperazine, thioridazine This list may not describe all possible interactions. Give your health care provider a list of all the medicines, herbs, non-prescription drugs, or dietary supplements you use. Also tell them if you smoke, drink alcohol, or use illegal drugs. Some items may interact with your medicine. What should I watch for while using this medicine? Visit your doctor or health care professional for regular checks on your progress. You may want to keep a record at home of how you feel your condition is responding to treatment. You may want to share this information with your  doctor or health care professional at each visit. You should contact your doctor or health care professional if your seizures get worse or if you have any new types of seizures. Do not stop taking this medicine or any of your seizure medicines unless instructed by your doctor or health care professional.  Stopping your medicine suddenly can increase your seizures or their severity. Wear a medical identification bracelet or chain if you are taking this medicine for seizures, and carry a card that lists all your medications. You may get drowsy, dizzy, or have blurred vision. Do not drive, use machinery, or do anything that needs mental alertness until you know how this medicine affects you. To reduce dizzy or fainting spells, do not sit or stand up quickly, especially if you are an older patient. Alcohol can increase drowsiness and dizziness. Avoid alcoholic drinks. Your mouth may get dry. Chewing sugarless gum or sucking hard candy, and drinking plenty of water will help. The use of this medicine may increase the chance of suicidal thoughts or actions. Pay special attention to how you are responding while on this medicine. Any worsening of mood, or thoughts of suicide or dying should be reported to your health care professional right away. Women who become pregnant while using this medicine may enroll in the [place] American Antiepileptic Drug Pregnancy Registry by calling [phone]. This registry collects information about the safety of antiepileptic drug use during pregnancy. What side effects may I notice from receiving this medicine? Side effects that you should report to your doctor or health care professional as soon as possible: -allergic reactions like skin rash, itching or hives, swelling of the face, lips, or tongue -worsening of mood, thoughts or actions of suicide or dying Side effects that usually do not require medical attention (report to your doctor or health care professional if they continue or are bothersome): -constipation -difficulty walking or controlling muscle movements -dizziness -nausea -slurred speech -tiredness -tremors -weight gain This list may not describe all possible side effects. Call your doctor for medical advice about side effects. You may report side effects  to FDA at 1-800-FDA-1088. Where should I keep my medicine? Keep out of reach of children. This medicine may cause accidental overdose and death if it taken by other adults, children, or pets. Mix any unused medicine with a substance like cat litter or coffee grounds. Then throw the medicine away in a sealed container like a sealed bag or a coffee can with a lid. Do not use the medicine after the expiration date. Store at room temperature between 15 and 30 degrees C (59 and 86 degrees F). NOTE: This sheet is a summary. It may not cover all possible information. If you have questions about this medicine, talk to your doctor, pharmacist, or health care provider.  2018 Elsevier/Gold Standard (2014-01-20 15:26:50)

## 2018-08-26 NOTE — Progress Notes (Signed)
PATIENT: Amanda West DOB: 05-14-1946  REASON FOR VISIT: follow up HISTORY FROM: patient  HISTORY OF PRESENT ILLNESS: Today 08/26/18:  Amanda West is a 72 year old female with a history of cervical.  She returns today for follow-up.  At the last visit she was referred to neurosurgery, she did have surgery.  She states that she no longer has paresthesias in the right arm but still does in the left arm.  She states that it feels like a numbness/achy/tingling sensation of the arm.  She states that when she has done a lot of work it is worse.  She reports that the discomfort does not interfere with her sleep.  She continues on Cymbalta.  She returns today for evaluation.  HISTORY Amanda West is a 72 year old right-handed white female with a history of cervical spinal stenosis. She has done well with pain on the Cymbalta. Since February of 2018 she has noted hypersensitivity of the forearms bilaterally, with some discomfort and pain that developed in July, but this has since improved. The patient denies any actual weakness of the arms, she denies any sensation changes in the legs or changes in balance. She denies any alteration in bowel or bladder control. She has not had any falls. She denies any increased symptoms with turning the head or looking up or looking down. The hypersensitivity of the forearms continues. She has a sensation of swelling in the arms. She is able to sleep fairly well, the discomfort does not keep her awake. She has some sensation in the feet, left greater than right, as if there is something covering her feet that is not actually there. She returns to the office today for an evaluation.  REVIEW OF SYSTEMS: Out of a complete 14 system review of symptoms, the patient complains only of the following symptoms, and all other reviewed systems are negative.  See HPI  ALLERGIES: Allergies  Allergen Reactions  . Codeine Nausea Only  . Nortriptyline     Blurred vision    HOME  MEDICATIONS: Outpatient Medications Prior to Visit  Medication Sig Dispense Refill  . aspirin EC 81 MG tablet Take 81 mg by mouth daily with supper.    Marland Kitchen. CALCIUM-MAGNESIUM-ZINC PO Take 1 tablet by mouth 2 (two) times daily.    . Capsicum, Cayenne, (CAYENNE PEPPER PO) Take 1,000 mg by mouth daily.     . Chromium Aspartate 1000 MCG TABS Take 1,000 mcg by mouth daily with supper.     Marland Kitchen. CINNAMON PO Take 1,000 mg by mouth 2 (two) times daily.     . cyclobenzaprine (FLEXERIL) 10 MG tablet Take 1 tablet (10 mg total) by mouth 3 (three) times daily as needed for muscle spasms. 30 tablet 0  . DULoxetine (CYMBALTA) 20 MG capsule Take 1 capsule (20 mg total) by mouth daily. (Patient taking differently: Take 20 mg by mouth at bedtime. ) 90 capsule 3  . Ferrous Sulfate (IRON) 325 (65 FE) MG TABS Take 325 mg by mouth daily with supper.     . fish oil-omega-3 fatty acids 1000 MG capsule Take 1,000 mg by mouth 2 (two) times daily. In the morning & with supper    . folic acid (FOLVITE) 400 MCG tablet Take 400 mcg by mouth daily with supper.     Marland Kitchen. HYDROcodone-acetaminophen (NORCO/VICODIN) 5-325 MG tablet Take 1-2 tablets by mouth every 4 (four) hours as needed (breakthrough pain). 30 tablet 0  . phenylephrine (SUDAFED PE) 10 MG TABS Take 10 mg by mouth daily.     .Marland Kitchen  Polyethyl Glyc-Propyl Glyc PF (SYSTANE PRESERVATIVE FREE) 0.4-0.3 % SOLN Place 1-2 drops into both eyes 3 (three) times daily as needed (for dry/irritated eyes.).    Marland Kitchen Potassium 99 MG TABS Take 99 mg by mouth daily.    Marland Kitchen senna (SENOKOT) 8.6 MG tablet Take 5 tablets by mouth at bedtime.     . valACYclovir (VALTREX) 500 MG tablet Take 500 mg by mouth daily as needed (for eye infection).     . vitamin B-12 (CYANOCOBALAMIN) 1000 MCG tablet Take 1,000 mcg by mouth daily.    . vitamin C (ASCORBIC ACID) 500 MG tablet Take 500 mg by mouth daily with supper.      No facility-administered medications prior to visit.     PAST MEDICAL HISTORY: Past Medical  History:  Diagnosis Date  . Abnormality of gait 07/22/2013  . Anemia   . H/O hiatal hernia   . Osteoporosis   . Shingles   . Spinal stenosis in cervical region 09/15/2014  . Vitamin D deficiency     PAST SURGICAL HISTORY: Past Surgical History:  Procedure Laterality Date  . ANTERIOR CERVICAL DECOMP/DISCECTOMY FUSION N/A 09/21/2017   Procedure: Anterior Cervical Decompression Fusion Cervical Four-Five,Cervical Five-Six,Cervical Six-Seven;  Surgeon: Julio Sicks, MD;  Location: [facility] OR;  Service: Neurosurgery;  Laterality: N/A;  anterior   . CATARACT EXTRACTION    . Hertial Hernia    . PARS PLANA VITRECTOMY  07/06/2012   Procedure: PARS PLANA VITRECTOMY WITH 25G REMOVAL/SUTURE INTRAOCULAR LENS;  Surgeon: Sherrie George, MD;  Location: [facility] OR;  Service: Ophthalmology;  Laterality: Left;    FAMILY HISTORY: Family History  Problem Relation Age of Onset  . Heart disease Mother   . Heart disease Father   . Diabetes Sister   . Cancer Brother   . Heart disease Brother     SOCIAL HISTORY: Social History   Socioeconomic History  . Marital status: Married    Spouse name: Chee Dimon  . Number of children: 1  . Years of education: Not on file  . Highest education level: Not on file  Occupational History  . Occupation: CARD Engineer, [employer]: [employer]  Social Needs  . Financial resource strain: Not on file  . Food insecurity:    Worry: Not on file    Inability: Not on file  . Transportation needs:    Medical: Not on file    Non-medical: Not on file  Tobacco Use  . Smoking status: Former Smoker    Years: 30.00    Last attempt to quit: 11/16/2013    Years since quitting: 4.7  . Smokeless tobacco: Never Used  Substance and Sexual Activity  . Alcohol use: No  . Drug use: No  . Sexual activity: Not on file  Lifestyle  . Physical activity:    Days per week: Not on file    Minutes per session: Not on file  . Stress: Not on file  Relationships  . Social  connections:    Talks on phone: Not on file    Gets together: Not on file    Attends religious service: Not on file    Active member of club or organization: Not on file    Attends meetings of clubs or organizations: Not on file    Relationship status: Not on file  . Intimate partner violence:    Fear of current or ex partner: Not on file    Emotionally abused: Not on file    Physically abused:  Not on file    Forced sexual activity: Not on file  Other Topics Concern  . Not on file  Social History Narrative   Patient is right handed.   Patient drinks 5-6 cup caffeine daily.      PHYSICAL EXAM  Vitals:   08/26/18 1110  BP: 119/69  Pulse: 64  Weight: 122 lb (55.3 kg)  Height: 5\' 3"  (1.6 m)   Body mass index is 21.61 kg/m.  Generalized: Well developed, in no acute distress   Neurological examination  Mentation: Alert oriented to time, place, history taking. Follows all commands speech and language fluent Cranial nerve II-XII: Pupils were equal round reactive to light. Extraocular movements were full, visual field were full on confrontational test. Facial sensation and strength were normal. Uvula tongue midline. Head turning and shoulder shrug  were normal and symmetric. Motor: The motor testing reveals 5 over 5 strength of all 4 extremities. Good symmetric motor tone is noted throughout.  Sensory: Sensory testing is intact to soft touch on all 4 extremities. No evidence of extinction is noted.  Coordination: Cerebellar testing reveals good finger-nose-finger and heel-to-shin bilaterally.  Gait and station: Gait is normal.  Reflexes: Deep tendon reflexes are symmetric and normal bilaterally.   DIAGNOSTIC DATA (LABS, IMAGING, TESTING) - I reviewed patient records, labs, notes, testing and imaging myself where available.  Lab Results  Component Value Date   WBC 3.9 (L) 09/16/2017   HGB 11.6 (L) 09/16/2017   HCT 34.8 (L) 09/16/2017   MCV 100.9 (H) 09/16/2017   PLT 212  09/16/2017      Component Value Date/Time   NA 137 09/16/2017 1127   K 4.1 09/16/2017 1127   CL 103 09/16/2017 1127   CO2 28 09/16/2017 1127   GLUCOSE 95 09/16/2017 1127   BUN 10 09/16/2017 1127   CREATININE 0.67 09/16/2017 1127   CALCIUM 9.3 09/16/2017 1127   PROT 7.6 07/27/2013 1357   ALBUMIN 4.1 07/27/2013 1357   AST 31 07/27/2013 1357   ALT 18 07/27/2013 1357   ALKPHOS 61 07/27/2013 1357   BILITOT 0.5 07/27/2013 1357   GFRNONAA >60 09/16/2017 1127   GFRAA >60 09/16/2017 1127   Lab Results  Component Value Date   VITAMINB12 1,433 (H) 07/30/2017   No results found for: TSH    ASSESSMENT AND PLAN 72 y.o. year old female  has a past medical history of Abnormality of gait (07/22/2013), Anemia, H/O hiatal hernia, Osteoporosis, Shingles, Spinal stenosis in cervical region (09/15/2014), and Vitamin D deficiency. here with:  1.  Cervical stenosis  The patient will continue on Cymbalta.  We discussed trying gabapentin to see if that would offer her some relief.  She is amenable to trying this.  We will start low-dose 100 mg.  I recommended taking gabapentin twice a day however she would like to take it once a day initially.  I am amenable to this plan.  We reviewed potential side effects of gabapentin.  She is advised that if her symptoms worsen or she develops new symptoms she should let us know.  She will follow-up in 1 year or sooner if needed.   Butch Penny, MSN, NP-C 08/26/2018, 11:17 AM [facility] [address], [address] [place], [place] [address] [phone]

## 2018-08-26 NOTE — Progress Notes (Signed)
I have read the note, and I agree with the clinical assessment and plan.  Amanda West   

## 2018-10-06 ENCOUNTER — Other Ambulatory Visit: Payer: Self-pay | Admitting: Neurology

## 2019-01-04 ENCOUNTER — Other Ambulatory Visit: Payer: Self-pay | Admitting: Physician Assistant

## 2019-01-06 ENCOUNTER — Other Ambulatory Visit: Payer: Self-pay | Admitting: Family Medicine

## 2019-01-06 DIAGNOSIS — Z1231 Encounter for screening mammogram for malignant neoplasm of breast: Secondary | ICD-10-CM

## 2019-02-18 ENCOUNTER — Ambulatory Visit
Admission: RE | Admit: 2019-02-18 | Discharge: 2019-02-18 | Disposition: A | Discharge status: Home / Self Care | Payer: Medicare Other | Source: Ambulatory Visit | Attending: Family Medicine | Admitting: Family Medicine

## 2019-02-18 ENCOUNTER — Other Ambulatory Visit: Payer: Self-pay

## 2019-02-18 DIAGNOSIS — Z1231 Encounter for screening mammogram for malignant neoplasm of breast: Secondary | ICD-10-CM

## 2019-03-04 ENCOUNTER — Encounter (INDEPENDENT_AMBULATORY_CARE_PROVIDER_SITE_OTHER): Payer: Medicare Other | Admitting: Ophthalmology

## 2019-04-04 ENCOUNTER — Encounter (INDEPENDENT_AMBULATORY_CARE_PROVIDER_SITE_OTHER): Payer: Medicare Other | Admitting: Ophthalmology

## 2019-07-20 ENCOUNTER — Encounter (INDEPENDENT_AMBULATORY_CARE_PROVIDER_SITE_OTHER): Payer: Medicare Other | Admitting: Ophthalmology

## 2019-08-31 ENCOUNTER — Other Ambulatory Visit: Payer: Self-pay

## 2019-08-31 ENCOUNTER — Encounter: Payer: Self-pay | Admitting: Adult Health

## 2019-08-31 ENCOUNTER — Ambulatory Visit (INDEPENDENT_AMBULATORY_CARE_PROVIDER_SITE_OTHER): Payer: Medicare Other | Admitting: Adult Health

## 2019-08-31 VITALS — BP 130/68 | HR 65 | Temp 97.5°F | Ht 63.0 in | Wt 121.2 lb

## 2019-08-31 DIAGNOSIS — M4712 Other spondylosis with myelopathy, cervical region: Secondary | ICD-10-CM | POA: Diagnosis not present

## 2019-08-31 NOTE — Patient Instructions (Signed)
Your Plan:  Continue to  Monitor symptoms  Continue follow-up with PCP Follow-up with [place] on an as needed basis  Thank you for coming to see [place] at [facility]. I hope we have been able to provide you high quality care today.  You may receive a patient satisfaction survey over the next few weeks. We would appreciate your feedback and comments so that we may continue to improve ourselves and the health of our patients.

## 2019-08-31 NOTE — Progress Notes (Signed)
PATIENT: Amanda West DOB: 1946-02-08  REASON FOR VISIT: follow up HISTORY FROM: patient  HISTORY OF PRESENT ILLNESS: Today 08/31/19:  Amanda West is a 74 year old female with a history of cervical stenosis with ACDF.  She returns today for follow-up.  She is currently on Cymbalta for numbness and tingling in the left arm.  She reports that she has been doing well.  She states that her surgeon Dr. Trenton Gammon advised that she could take Cymbalta every other day.  She feels it has been helpful especially for her mood here recently.  She states on occasion she will have a pressure sensation if she has been working all day.  Otherwise she is not having any numbness and tingling in the arms like she was.  Overall she feels that she is doing well.  She returns today for an evaluation.  HISTORY 08/26/18:  Amanda West is a 73 year old female with a history of cervical.  She returns today for follow-up.  At the last visit she was referred to neurosurgery, she did have surgery.  She states that she no longer has paresthesias in the right arm but still does in the left arm.  She states that it feels like a numbness/achy/tingling sensation of the arm.  She states that when she has done a lot of work it is worse.  She reports that the discomfort does not interfere with her sleep.  She continues on Cymbalta.  She returns today for evaluation.   REVIEW OF SYSTEMS: Out of a complete 14 system review of symptoms, the patient complains only of the following symptoms, and all other reviewed systems are negative.  See HPI  ALLERGIES: Allergies  Allergen Reactions  . Codeine Nausea Only  . Nortriptyline     Blurred vision    HOME MEDICATIONS: Outpatient Medications Prior to Visit  Medication Sig Dispense Refill  . aspirin EC 81 MG tablet Take 81 mg by mouth daily with supper.    Marland Kitchen CALCIUM-MAGNESIUM-ZINC PO Take 1 tablet by mouth 2 (two) times daily.    . Capsicum, Cayenne, (CAYENNE PEPPER PO) Take 1,000 mg  by mouth daily.     . Chromium Aspartate 1000 MCG TABS Take 1,000 mcg by mouth daily with supper.     Marland Kitchen CINNAMON PO Take 1,000 mg by mouth 2 (two) times daily.     . cyclobenzaprine (FLEXERIL) 10 MG tablet Take 1 tablet (10 mg total) by mouth 3 (three) times daily as needed for muscle spasms. 30 tablet 0  . DULoxetine (CYMBALTA) 20 MG capsule TAKE 1 CAPSULE BY MOUTH ONCE DAILY 90 capsule 3  . Ferrous Sulfate (IRON) 325 (65 FE) MG TABS Take 325 mg by mouth daily with supper.     . fish oil-omega-3 fatty acids 1000 MG capsule Take 1,000 mg by mouth 2 (two) times daily. In the morning & with supper    . folic acid (FOLVITE) 564 MCG tablet Take 400 mcg by mouth daily with supper.     . gabapentin (NEURONTIN) 100 MG capsule Take 1 capsule (100 mg total) by mouth daily. 30 capsule 11  . HYDROcodone-acetaminophen (NORCO/VICODIN) 5-325 MG tablet Take 1-2 tablets by mouth every 4 (four) hours as needed (breakthrough pain). 30 tablet 0  . phenylephrine (SUDAFED PE) 10 MG TABS Take 10 mg by mouth daily.     Vladimir Faster Glyc-Propyl Glyc PF (SYSTANE PRESERVATIVE FREE) 0.4-0.3 % SOLN Place 1-2 drops into both eyes 3 (three) times daily as needed (for dry/irritated eyes.).    Marland Kitchen  Potassium 99 MG TABS Take 99 mg by mouth daily.    Marland Kitchen senna (SENOKOT) 8.6 MG tablet Take 5 tablets by mouth at bedtime.     . valACYclovir (VALTREX) 500 MG tablet Take 500 mg by mouth daily as needed (for eye infection).     . vitamin B-12 (CYANOCOBALAMIN) 1000 MCG tablet Take 1,000 mcg by mouth daily.    . vitamin C (ASCORBIC ACID) 500 MG tablet Take 500 mg by mouth daily with supper.      No facility-administered medications prior to visit.     PAST MEDICAL HISTORY: Past Medical History:  Diagnosis Date  . Abnormality of gait 07/22/2013  . Anemia   . H/O hiatal hernia   . Osteoporosis   . Shingles   . Spinal stenosis in cervical region 09/15/2014  . Vitamin D deficiency     PAST SURGICAL HISTORY: Past Surgical History:   Procedure Laterality Date  . ANTERIOR CERVICAL DECOMP/DISCECTOMY FUSION N/A 09/21/2017   Procedure: Anterior Cervical Decompression Fusion Cervical Four-Five,Cervical Five-Six,Cervical Six-Seven;  Surgeon: Julio Sicks, MD;  Location: [facility] OR;  Service: Neurosurgery;  Laterality: N/A;  anterior   . CATARACT EXTRACTION    . Hertial Hernia    . PARS PLANA VITRECTOMY  07/06/2012   Procedure: PARS PLANA VITRECTOMY WITH 25G REMOVAL/SUTURE INTRAOCULAR LENS;  Surgeon: Sherrie George, MD;  Location: [facility] OR;  Service: Ophthalmology;  Laterality: Left;    FAMILY HISTORY: Family History  Problem Relation Age of Onset  . Heart disease Mother   . Heart disease Father   . Diabetes Sister   . Cancer Brother   . Heart disease Brother     SOCIAL HISTORY: Social History   Socioeconomic History  . Marital status: Married    Spouse name: Raiden Yearwood  . Number of children: 1  . Years of education: Not on file  . Highest education level: Not on file  Occupational History  . Occupation: CARD Engineer, [employer]: [employer]  Social Needs  . Financial resource strain: Not on file  . Food insecurity    Worry: Not on file    Inability: Not on file  . Transportation needs    Medical: Not on file    Non-medical: Not on file  Tobacco Use  . Smoking status: Former Smoker    Years: 30.00    Quit date: 11/16/2013    Years since quitting: 5.7  . Smokeless tobacco: Never Used  Substance and Sexual Activity  . Alcohol use: No  . Drug use: No  . Sexual activity: Not on file  Lifestyle  . Physical activity    Days per week: Not on file    Minutes per session: Not on file  . Stress: Not on file  Relationships  . Social Musician on phone: Not on file    Gets together: Not on file    Attends religious service: Not on file    Active member of club or organization: Not on file    Attends meetings of clubs or organizations: Not on file    Relationship status: Not on file  .  Intimate partner violence    Fear of current or ex partner: Not on file    Emotionally abused: Not on file    Physically abused: Not on file    Forced sexual activity: Not on file  Other Topics Concern  . Not on file  Social History Narrative   Patient is right handed.  Patient drinks 5-6 cup caffeine daily.      PHYSICAL EXAM  Vitals:   08/31/19 1057  BP: 130/68  Pulse: 65  Temp: (!) 97.5 F (36.4 C)  Weight: 121 lb 3.2 oz (55 kg)  Height: 5\' 3"  (1.6 m)   Body mass index is 21.47 kg/m.  Generalized: Well developed, in no acute distress   Neurological examination  Mentation: Alert oriented to time, place, history taking. Follows all commands speech and language fluent Cranial nerve II-XII: Pupils were equal round reactive to light. Extraocular movements were full, visual field were full on confrontational test. Facial sensation and strength were normal. Uvula tongue midline. Head turning and shoulder shrug  were normal and symmetric. Motor: The motor testing reveals 5 over 5 strength of all 4 extremities. Good symmetric motor tone is noted throughout.  Sensory: Sensory testing is intact to soft touch on all 4 extremities. No evidence of extinction is noted.  Coordination: Cerebellar testing reveals good finger-nose-finger and heel-to-shin bilaterally.  Gait and station: Gait is normal.  Reflexes: Deep tendon reflexes are symmetric and normal bilaterally.   DIAGNOSTIC DATA (LABS, IMAGING, TESTING) - I reviewed patient records, labs, notes, testing and imaging myself where available.  Lab Results  Component Value Date   WBC 3.9 (L) 09/16/2017   HGB 11.6 (L) 09/16/2017   HCT 34.8 (L) 09/16/2017   MCV 100.9 (H) 09/16/2017   PLT 212 09/16/2017      Component Value Date/Time   NA 137 09/16/2017 1127   K 4.1 09/16/2017 1127   CL 103 09/16/2017 1127   CO2 28 09/16/2017 1127   GLUCOSE 95 09/16/2017 1127   BUN 10 09/16/2017 1127   CREATININE 0.67 09/16/2017 1127    CALCIUM 9.3 09/16/2017 1127   PROT 7.6 07/27/2013 1357   ALBUMIN 4.1 07/27/2013 1357   AST 31 07/27/2013 1357   ALT 18 07/27/2013 1357   ALKPHOS 61 07/27/2013 1357   BILITOT 0.5 07/27/2013 1357   GFRNONAA >60 09/16/2017 1127   GFRAA >60 09/16/2017 1127   No results found for: CHOL, HDL, LDLCALC, LDLDIRECT, TRIG, CHOLHDL No results found for: GBTD1V Lab Results  Component Value Date   VITAMINB12 1,433 (H) 07/30/2017   No results found for: TSH    ASSESSMENT AND PLAN 73 y.o. year old female  has a past medical history of Abnormality of gait (07/22/2013), Anemia, H/O hiatal hernia, Osteoporosis, Shingles, Spinal stenosis in cervical region (09/15/2014), and Vitamin D deficiency. here with:  1.  Cervical spondylosis without myelopathy  Overall the patient is doing well.  She continues on Cymbalta 20 mg.  She is currently taking it every other day.  She is not having any new symptoms.  Advised that since she has remained stable after her surgery she can return to her PCP for continued follow-up.  She will follow-up with our office on an as-needed basis.   I spent 15 minutes with the patient. 50% of this time was spent reviewing plan of care  Butch Penny, MSN, NP-C 08/31/2019, 10:52 AM [facility] [address], [address] [place], [place] [address] [phone]

## 2019-08-31 NOTE — Progress Notes (Signed)
I have read the note, and I agree with the clinical assessment and plan.  Rikki Smestad K Tracina Beaumont   

## 2020-02-16 ENCOUNTER — Other Ambulatory Visit: Payer: Self-pay | Admitting: Family Medicine

## 2020-02-16 DIAGNOSIS — R0989 Other specified symptoms and signs involving the circulatory and respiratory systems: Secondary | ICD-10-CM

## 2020-02-20 ENCOUNTER — Ambulatory Visit
Admission: RE | Admit: 2020-02-20 | Discharge: 2020-02-20 | Disposition: A | Discharge status: Home / Self Care | Payer: Medicare Other | Source: Ambulatory Visit | Attending: Family Medicine | Admitting: Family Medicine

## 2020-02-20 DIAGNOSIS — R0989 Other specified symptoms and signs involving the circulatory and respiratory systems: Secondary | ICD-10-CM

## 2020-03-05 ENCOUNTER — Other Ambulatory Visit: Payer: Self-pay | Admitting: Adult Health

## 2020-03-14 ENCOUNTER — Other Ambulatory Visit: Payer: Self-pay | Admitting: Adult Health

## 2020-09-12 ENCOUNTER — Other Ambulatory Visit: Payer: Self-pay | Admitting: Family Medicine

## 2020-09-12 DIAGNOSIS — Z1231 Encounter for screening mammogram for malignant neoplasm of breast: Secondary | ICD-10-CM

## 2020-09-16 ENCOUNTER — Emergency Department (HOSPITAL_COMMUNITY)
Admission: EM | Admit: 2020-09-16 | Discharge: 2020-09-16 | Disposition: A | Discharge status: Home / Self Care | Payer: Medicare Other | Attending: Emergency Medicine | Admitting: Emergency Medicine

## 2020-09-16 ENCOUNTER — Encounter (HOSPITAL_COMMUNITY): Payer: Self-pay | Admitting: Emergency Medicine

## 2020-09-16 ENCOUNTER — Other Ambulatory Visit: Payer: Self-pay

## 2020-09-16 DIAGNOSIS — Z7982 Long term (current) use of aspirin: Secondary | ICD-10-CM | POA: Diagnosis not present

## 2020-09-16 DIAGNOSIS — Z87891 Personal history of nicotine dependence: Secondary | ICD-10-CM | POA: Diagnosis not present

## 2020-09-16 DIAGNOSIS — N3001 Acute cystitis with hematuria: Secondary | ICD-10-CM | POA: Insufficient documentation

## 2020-09-16 DIAGNOSIS — R350 Frequency of micturition: Secondary | ICD-10-CM | POA: Diagnosis present

## 2020-09-16 LAB — COMPREHENSIVE METABOLIC PANEL
ALT: 23 U/L (ref 0–44)
AST: 36 U/L (ref 15–41)
Albumin: 3.7 g/dL (ref 3.5–5.0)
Alkaline Phosphatase: 69 U/L (ref 38–126)
Anion gap: 7 (ref 5–15)
BUN: 9 mg/dL (ref 8–23)
CO2: 27 mmol/L (ref 22–32)
Calcium: 9 mg/dL (ref 8.9–10.3)
Chloride: 99 mmol/L (ref 98–111)
Creatinine, Ser: 0.63 mg/dL (ref 0.44–1.00)
GFR, Estimated: >60 mL/min (ref >60)
Glucose, Bld: 82 mg/dL (ref 70–99)
Potassium: 3.5 mmol/L (ref 3.5–5.1)
Sodium: 133 mmol/L — ABNORMAL LOW (ref 135–145)
Total Bilirubin: 0.3 mg/dL (ref 0.3–1.2)
Total Protein: 7.2 g/dL (ref 6.5–8.1)

## 2020-09-16 LAB — URINALYSIS, ROUTINE W REFLEX MICROSCOPIC
Bilirubin Urine: NEGATIVE
Glucose, UA: NEGATIVE mg/dL
Ketones, ur: NEGATIVE mg/dL
Nitrite: NEGATIVE
Protein, ur: 100 mg/dL — AB
Specific Gravity, Urine: 1.003 — ABNORMAL LOW (ref 1.005–1.030)
WBC, UA: >50 WBC/hpf — ABNORMAL HIGH (ref 0–5)
pH: 6 (ref 5.0–8.0)

## 2020-09-16 LAB — CBC WITH DIFFERENTIAL/PLATELET
Abs Immature Granulocytes: 0.03 10*3/uL (ref 0.00–0.07)
Basophils Absolute: 0 10*3/uL (ref 0.0–0.1)
Basophils Relative: 0 %
Eosinophils Absolute: 0.2 10*3/uL (ref 0.0–0.5)
Eosinophils Relative: 3 %
HCT: 31.2 % — ABNORMAL LOW (ref 36.0–46.0)
Hemoglobin: 10 g/dL — ABNORMAL LOW (ref 12.0–15.0)
Immature Granulocytes: 0 %
Lymphocytes Relative: 23 %
Lymphs Abs: 1.6 10*3/uL (ref 0.7–4.0)
MCH: 32.7 pg (ref 26.0–34.0)
MCHC: 32.1 g/dL (ref 30.0–36.0)
MCV: 102 fL — ABNORMAL HIGH (ref 80.0–100.0)
Monocytes Absolute: 0.5 10*3/uL (ref 0.1–1.0)
Monocytes Relative: 7 %
Neutro Abs: 4.8 10*3/uL (ref 1.7–7.7)
Neutrophils Relative %: 67 %
Platelets: 198 10*3/uL (ref 150–400)
RBC: 3.06 MIL/uL — ABNORMAL LOW (ref 3.87–5.11)
RDW: 11.9 % (ref 11.5–15.5)
WBC: 7.1 10*3/uL (ref 4.0–10.5)
nRBC: 0 % (ref 0.0–0.2)

## 2020-09-16 MED ORDER — CEPHALEXIN 500 MG PO CAPS: 500.0000 mg | ORAL_CAPSULE | Freq: Two times a day (BID) | ORAL | 0 refills | Status: AC

## 2020-09-16 NOTE — ED Triage Notes (Signed)
Pt to ED with c/o urinary frequency and burning x's 3 days

## 2020-09-16 NOTE — ED Provider Notes (Signed)
[facility] EMERGENCY DEPARTMENT Provider Note   CSN: [id number] Arrival date & time: 09/16/20  0143     History Chief Complaint  Patient presents with  . Urinary Tract Infection    Amanda West is a 74 y.o. female.  74 year old female with complaint of urinary frequency and dysuria. Symptoms started 3 days ago with discomfort with voiding, noticed worsening discomfort with urinary frequency the following days, now with difficulty holding her urine. Denies changes in bowel habits, abdominal pain, back pain, fevers, chills, nausea or vomiting. Does not have UTIs frequently. No other complaints or concerns.         Past Medical History:  Diagnosis Date  . Abnormality of gait 07/22/2013  . Anemia   . H/O hiatal hernia   . Osteoporosis   . Shingles   . Spinal stenosis in cervical region 09/15/2014  . Vitamin D deficiency     Patient Active Problem List   Diagnosis Date Noted  . Cervical spinal stenosis 09/21/2017  . Spinal stenosis in cervical region 09/15/2014  . Abnormality of gait 07/22/2013  . Dislocated IOL (intraocular lens), posterior 06/24/2012    Past Surgical History:  Procedure Laterality Date  . ANTERIOR CERVICAL DECOMP/DISCECTOMY FUSION N/A 09/21/2017   Procedure: Anterior Cervical Decompression Fusion Cervical Four-Five,Cervical Five-Six,Cervical Six-Seven;  Surgeon: Julio Sicks, MD;  Location: [facility] OR;  Service: Neurosurgery;  Laterality: N/A;  anterior   . CATARACT EXTRACTION    . Hertial Hernia    . PARS PLANA VITRECTOMY  07/06/2012   Procedure: PARS PLANA VITRECTOMY WITH 25G REMOVAL/SUTURE INTRAOCULAR LENS;  Surgeon: Sherrie George, MD;  Location: [facility] OR;  Service: Ophthalmology;  Laterality: Left;     OB History   No obstetric history on file.     Family History  Problem Relation Age of Onset  . Heart disease Mother   . Heart disease Father   . Diabetes Sister   . Cancer Brother   . Heart disease Brother     Social History    Tobacco Use  . Smoking status: Former Smoker    Years: 30.00    Quit date: 11/16/2013    Years since quitting: 6.8  . Smokeless tobacco: Never Used  Substance Use Topics  . Alcohol use: No  . Drug use: No    Home Medications Prior to Admission medications   Medication Sig Start Date End Date Taking? Authorizing Provider  aspirin EC 81 MG tablet Take 81 mg by mouth daily with supper.    [provider]  CALCIUM-MAGNESIUM-ZINC PO Take 1 tablet by mouth 2 (two) times daily.    [provider]  Capsicum, Cayenne, (CAYENNE PEPPER PO) Take 1,000 mg by mouth daily.     [provider]  cephALEXin (KEFLEX) 500 MG capsule Take 1 capsule (500 mg total) by mouth 2 (two) times daily for 5 days. 09/16/20 09/21/20  Jeannie Fend, PA-C  Chromium Aspartate 1000 MCG TABS Take 1,000 mcg by mouth daily with supper.     [provider]  CINNAMON PO Take 1,000 mg by mouth 2 (two) times daily.     [provider]  cyclobenzaprine (FLEXERIL) 10 MG tablet Take 1 tablet (10 mg total) by mouth 3 (three) times daily as needed for muscle spasms. 09/22/17   Julio Sicks, MD  DULoxetine (CYMBALTA) 20 MG capsule TAKE 1 CAPSULE BY MOUTH ONCE DAILY 10/07/18   Butch Penny, NP  Ferrous Sulfate (IRON) 325 (65 FE) MG TABS Take  325 mg by mouth daily with supper.     [provider]  fish oil-omega-3 fatty acids 1000 MG capsule Take 1,000 mg by mouth 2 (two) times daily. In the morning & with supper    [provider]  folic acid (FOLVITE) 400 MCG tablet Take 400 mcg by mouth daily with supper.     [provider]  gabapentin (NEURONTIN) 100 MG capsule Take 1 capsule (100 mg total) by mouth daily. 08/26/18   Butch Penny, NP  HYDROcodone-acetaminophen (NORCO/VICODIN) 5-325 MG tablet Take 1-2 tablets by mouth every 4 (four) hours as needed (breakthrough pain). 09/22/17   Julio Sicks, MD  phenylephrine (SUDAFED PE) 10 MG TABS Take 10 mg by mouth  daily.     [provider]  Polyethyl Glyc-Propyl Glyc PF (SYSTANE PRESERVATIVE FREE) 0.4-0.3 % SOLN Place 1-2 drops into both eyes 3 (three) times daily as needed (for dry/irritated eyes.).    [provider]  Potassium 99 MG TABS Take 99 mg by mouth daily.    [provider]  senna (SENOKOT) 8.6 MG tablet Take 5 tablets by mouth at bedtime.     [provider]  valACYclovir (VALTREX) 500 MG tablet Take 500 mg by mouth daily as needed (for eye infection).     [provider]  vitamin B-12 (CYANOCOBALAMIN) 1000 MCG tablet Take 1,000 mcg by mouth daily.    [provider]  vitamin C (ASCORBIC ACID) 500 MG tablet Take 500 mg by mouth daily with supper.     [provider]  Zinc 50 MG CAPS Take 1 capsule by mouth. OTC    [provider]    Allergies    Codeine and Nortriptyline  Review of Systems   Review of Systems  Constitutional: Negative for chills and fever.  Gastrointestinal: Negative for abdominal pain, constipation, diarrhea, nausea and vomiting.  Genitourinary: Positive for dysuria and frequency. Negative for hematuria.  Musculoskeletal: Negative for back pain.  Skin: Negative for rash and wound.  Allergic/Immunologic: Negative for immunocompromised state.  Neurological: Negative for weakness.  All other systems reviewed and are negative.   Physical Exam Updated Vital Signs BP 124/66 (BP Location: Right Arm)   Pulse 62   Temp 98.6 F (37 C) (Oral)   Resp 18   Ht 5\' 2"  (1.575 m)   Wt 52.2 kg   SpO2 97%   BMI 21.03 kg/m   Physical Exam Vitals and nursing note reviewed.  Constitutional:      General: She is not in acute distress.    Appearance: She is well-developed. She is not diaphoretic.  HENT:     Head: Normocephalic and atraumatic.  Cardiovascular:     Rate and Rhythm: Normal rate and regular rhythm.     Pulses: Normal pulses.     Heart sounds: Normal heart sounds.  Pulmonary:     Effort:  Pulmonary effort is normal.     Breath sounds: Normal breath sounds.  Abdominal:     Palpations: Abdomen is soft.     Tenderness: There is no abdominal tenderness. There is no right CVA tenderness or left CVA tenderness.  Musculoskeletal:     Right lower leg: No edema.     Left lower leg: No edema.  Skin:    General: Skin is warm and dry.     Findings: No erythema or rash.  Neurological:     Mental Status: She is alert and oriented to person, place, and time.  Psychiatric:  Behavior: Behavior normal.     ED Results / Procedures / Treatments   Labs (all labs ordered are listed, but only abnormal results are displayed) Labs Reviewed  URINALYSIS, ROUTINE W REFLEX MICROSCOPIC - Abnormal; Notable for the following components:      Result Value   Color, Urine AMBER (*)    APPearance TURBID (*)    Specific Gravity, Urine 1.003 (*)    Hgb urine dipstick LARGE (*)    Protein, ur 100 (*)    Leukocytes,Ua LARGE (*)    WBC, UA >50 (*)    Bacteria, UA MANY (*)    Non Squamous Epithelial 0-5 (*)    All other components within normal limits  CBC WITH DIFFERENTIAL/PLATELET - Abnormal; Notable for the following components:   RBC 3.06 (*)    Hemoglobin 10.0 (*)    HCT 31.2 (*)    MCV 102.0 (*)    All other components within normal limits  COMPREHENSIVE METABOLIC PANEL - Abnormal; Notable for the following components:   Sodium 133 (*)    All other components within normal limits  URINE CULTURE    EKG None  Radiology No results found.  Procedures Procedures (including critical care time)  Medications Ordered in ED Medications - No data to display  ED Course  I have reviewed the triage vital signs and the nursing notes.  Pertinent labs & imaging results that were available during my care of the patient were reviewed by me and considered in my medical decision making (see chart for details).  Clinical Course as of Sep 16 945  Sun Sep 16, 2020  6119 74 year old female  with complaint of dysuria and urinary frequency as above.  On exam, well-appearing, abdomen soft nontender, no CVA tenderness.  Reviewed labs, no significant change to her CBC or CMP, does have mild anemia with hemoglobin of 10.0.  Urinalysis suggestive of UTI with large hemoglobin, protein, large leukocytes with greater than 50 white blood cells and many bacteria.  Plan is to treat with Keflex, urine sent for culture.  Patient is scheduled to follow-up with PCP in the coming week.  Discussed follow-up UA due to her hematuria.  Return precautions given including fever, abdominal pain, worsening or concerning symptoms.   [LM]    Clinical Course User Index [LM] Alden Hipp   MDM Rules/Calculators/A&P                          Final Clinical Impression(s) / ED Diagnoses Final diagnoses:  Acute cystitis with hematuria    Rx / DC Orders ED Discharge Orders         Ordered    cephALEXin (KEFLEX) 500 MG capsule  2 times daily        09/16/20 0939           Jeannie Fend, PA-C 09/16/20 [id number]    Alvira Monday, MD 09/18/20 1539

## 2020-09-16 NOTE — Discharge Instructions (Addendum)
Take Keflex as prescribed and complete the full course. Follow up with your doctor as scheduled. Recheck urine to be sure blood (microscopic) has resolved.  Return to ER for fever, worsening or concerning symptoms.

## 2020-09-19 LAB — URINE CULTURE: Culture: >=100000 — AB

## 2020-09-20 ENCOUNTER — Telehealth: Payer: Self-pay | Admitting: *Deleted

## 2020-09-20 NOTE — Progress Notes (Signed)
ED Antimicrobial Stewardship Positive Culture Follow Up   Amanda West is an 74 y.o. female who presented to [facility] on 09/16/2020 with a chief complaint of urinary frequency and dysuria  Chief Complaint  Patient presents with  . Urinary Tract Infection    Recent Results (from the past 720 hour(s))  Urine culture     Status: Abnormal   Collection Time: 09/16/20  3:12 AM   Specimen: Urine, Random  Result Value Ref Range Status   Specimen Description URINE, RANDOM  Final   Special Requests   Final    NONE Performed at [facility], [address], [place], [place] [address]    Culture (A)  Final    >=100,000 COLONIES/mL KLEBSIELLA OXYTOCA >=100,000 COLONIES/mL ENTEROBACTER CLOACAE    Report Status 09/19/2020 FINAL  Final   Organism ID, Bacteria KLEBSIELLA OXYTOCA (A)  Final   Organism ID, Bacteria ENTEROBACTER CLOACAE (A)  Final      Susceptibility   Enterobacter cloacae - MIC*    CEFAZOLIN >=64 RESISTANT Resistant     CIPROFLOXACIN <=0.25 SENSITIVE Sensitive     GENTAMICIN <=1 SENSITIVE Sensitive     IMIPENEM 0.5 SENSITIVE Sensitive     NITROFURANTOIN 32 SENSITIVE Sensitive     TRIMETH/SULFA <=20 SENSITIVE Sensitive     PIP/TAZO <=4 SENSITIVE Sensitive     * >=100,000 COLONIES/mL ENTEROBACTER CLOACAE   Klebsiella oxytoca - MIC*    AMPICILLIN >=32 RESISTANT Resistant     CEFAZOLIN 8 SENSITIVE Sensitive     CEFTRIAXONE <=0.25 SENSITIVE Sensitive     CIPROFLOXACIN <=0.25 SENSITIVE Sensitive     GENTAMICIN <=1 SENSITIVE Sensitive     IMIPENEM <=0.25 SENSITIVE Sensitive     NITROFURANTOIN <=16 SENSITIVE Sensitive     TRIMETH/SULFA <=20 SENSITIVE Sensitive     AMPICILLIN/SULBACTAM 8 SENSITIVE Sensitive     PIP/TAZO <=4 SENSITIVE Sensitive     * >=100,000 COLONIES/mL KLEBSIELLA OXYTOCA    [x]  Treated with Cephelexin, organism resistant to prescribed antimicrobial.  Stop cephalexin. Start Bactrim 1 DS BID x 5 days []  Patient discharged originally without  antimicrobial agent and treatment is now indicated  New antibiotic prescription: Stop cephalexin. Bactrim 1 DS BID x 5 days.  ED Provider: , PA-C  , PharmD PGY-1 Acute Care Pharmacy Resident 09/20/2020 10:05 AM Monday - Friday phone -  [phone] Saturday - Sunday phone - [phone]

## 2020-09-20 NOTE — Telephone Encounter (Signed)
Post ED Visit - Positive Culture Follow-up: Successful Patient Follow-Up  Culture assessed and recommendations reviewed by:  []  , Pharm.D. []  Enzo Bi, Pharm.D., BCPS AQ-ID []  , Pharm.D., BCPS []  Celedonio Miyamoto, .D., BCPS []  [place], .D., BCPS, AAHIVP []  Georgina Pillion, Pharm.D., BCPS, AAHIVP []  [address], PharmD, BCPS []  , PharmD, BCPS []  [place], PharmD, BCPS []  [address], PharmD  Positive urine culture  []  Patient discharged without antimicrobial prescription and treatment is now indicated [x]  Organism is resistant to prescribed ED discharge antimicrobial []  Patient with positive blood cultures  Changes discussed with ED provider: , PA-C New antibiotic prescription Bactrim DS 1 tab Bid x 5 days Called to Amanda West, [phone]  Contacted patient, date 09/20/2020, time 1145   09/20/2020, 11:45 AM

## 2020-10-05 ENCOUNTER — Other Ambulatory Visit: Payer: Self-pay

## 2020-10-05 ENCOUNTER — Ambulatory Visit
Admission: RE | Admit: 2020-10-05 | Discharge: 2020-10-05 | Disposition: A | Discharge status: Home / Self Care | Payer: Medicare Other | Source: Ambulatory Visit | Attending: Family Medicine | Admitting: Family Medicine

## 2020-10-05 DIAGNOSIS — Z1231 Encounter for screening mammogram for malignant neoplasm of breast: Secondary | ICD-10-CM

## 2020-10-12 ENCOUNTER — Ambulatory Visit: Payer: Self-pay | Admitting: Surgery

## 2020-10-12 NOTE — H&P (Signed)
Amanda West Appointment: 10/12/2020 1:45 PM Location: [facility] Patient #: [id number] DOB: May 23, 1946 Married / Language: [place] / Race: White Female   History of Present Illness Amanda West; 10/12/2020 2:11 PM) Patient words: Amanda West is a 74 year old female who presents with subcutaneous masses on the bilateral lower abdominal wall. She would like them removed. The right sided mass has been present longer than the left sided one and is a little bigger. They both have slowly grown for years and they are at the point where she would like them removed. The appearance of them bother her and they also hurt and become sore when she lays on them or her clothes irritate them.  The patient is a 74 year old female.   Diagnostic Studies History Santiago Glad, [place]; 10/12/2020 1:55 PM) Colonoscopy  within last year Mammogram  within last year Pap Smear  >5 years ago  Allergies Santiago Glad, CMA; 10/12/2020 1:56 PM) Codeine Phosphate *ANALGESICS - OPIOID*  Allergies Reconciled   Medication History Santiago Glad, CMA; 10/12/2020 1:56 PM) DULoxetine HCl (20MG  Capsule DR Part, Oral) Active. Medications Reconciled  Pregnancy / Birth History , CMA; 10/12/2020 1:55 PM) Age at menarche  16 years. Age of menopause  44-55 Gravida  1 Maternal age  57-20 Para  1 Regular periods   Other Problems 19-38, CMA; 10/12/2020 1:55 PM) No pertinent past medical history     Review of Systems 13/04/2020 West; 10/12/2020 2:11 PM) General Not Present- Appetite Loss, Chills, Fatigue, Fever, Night Sweats, Weight Gain and Weight Loss. Note: All other systems negative (unless as noted in HPI & included Review of Systems) Skin Not Present- Change in Wart/Mole, Dryness, Hives, Jaundice, New Lesions, Non-Healing Wounds, Rash and Ulcer. HEENT Not Present- Earache, Hearing Loss, Hoarseness, Nose Bleed, Oral Ulcers, Ringing in the Ears,  Seasonal Allergies, Sinus Pain, Sore Throat, Visual Disturbances, Wears glasses/contact lenses and Yellow Eyes. Respiratory Not Present- Bloody sputum, Chronic Cough, Difficulty Breathing, Snoring and Wheezing. Breast Not Present- Breast Mass, Breast Pain, Nipple Discharge and Skin Changes. Cardiovascular Not Present- Chest Pain, Difficulty Breathing Lying Down, Leg Cramps, Palpitations, Rapid Heart Rate, Shortness of Breath and Swelling of Extremities. Gastrointestinal Not Present- Abdominal Pain, Bloating, Bloody Stool, Change in Bowel Habits, Chronic diarrhea, Constipation, Difficulty Swallowing, Excessive gas, Gets full quickly at meals, Hemorrhoids, Indigestion, Nausea, Rectal Pain and Vomiting. Female Genitourinary Not Present- Frequency, Nocturia, Painful Urination, Pelvic Pain and Urgency. Musculoskeletal Not Present- Back Pain, Joint Pain, Joint Stiffness, Muscle Pain, Muscle Weakness and Swelling of Extremities. Neurological Not Present- Decreased Memory, Fainting, Headaches, Numbness, Seizures, Tingling, Tremor, Trouble walking and Weakness. Psychiatric Not Present- Anxiety, Bipolar, Change in Sleep Pattern, Depression, Fearful and Frequent crying. Endocrine Not Present- Cold Intolerance, Excessive Hunger, Hair Changes, Heat Intolerance, Hot flashes and New Diabetes. Hematology Not Present- Easy Bruising, Excessive bleeding, Gland problems, HIV and Persistent Infections.  Vitals 13/04/2020 CMA; 10/12/2020 1:56 PM) 10/12/2020 1:55 PM Weight: 110 lb Height: 63in Body Surface Area: 1.5 m Body Mass Index: 19.49 kg/m  Temp.: 97.37F  Pulse: 84 (Regular)  BP: 138/78(Sitting, Left Arm, Standard)       Physical Exam 13/04/2020 West; 10/12/2020 2:14 PM) General Mental Status-Alert. General Appearance-Consistent with stated age. Posture-Normal posture. Voice-Normal.

## 2020-10-17 ENCOUNTER — Other Ambulatory Visit: Payer: Self-pay | Admitting: Family Medicine

## 2020-10-17 DIAGNOSIS — M810 Age-related osteoporosis without current pathological fracture: Secondary | ICD-10-CM

## 2020-12-03 NOTE — Progress Notes (Signed)
LVMM for pt with covid testing site address and directions.  Also left call back phone number of [phone] I fpt has any further questions.

## 2020-12-13 ENCOUNTER — Encounter (HOSPITAL_BASED_OUTPATIENT_CLINIC_OR_DEPARTMENT_OTHER): Payer: Self-pay | Admitting: Surgery

## 2020-12-15 ENCOUNTER — Other Ambulatory Visit (HOSPITAL_COMMUNITY)
Admission: RE | Admit: 2020-12-15 | Discharge: 2020-12-15 | Disposition: A | Discharge status: Home / Self Care | Payer: Medicare Other | Source: Ambulatory Visit | Attending: Surgery | Admitting: Surgery

## 2020-12-15 DIAGNOSIS — Z20822 Contact with and (suspected) exposure to covid-19: Secondary | ICD-10-CM | POA: Insufficient documentation

## 2020-12-15 DIAGNOSIS — Z01812 Encounter for preprocedural laboratory examination: Secondary | ICD-10-CM | POA: Insufficient documentation

## 2020-12-16 LAB — SARS CORONAVIRUS 2 (TAT 6-24 HRS): SARS Coronavirus 2: NEGATIVE

## 2020-12-17 ENCOUNTER — Encounter (HOSPITAL_BASED_OUTPATIENT_CLINIC_OR_DEPARTMENT_OTHER): Payer: Self-pay | Admitting: Surgery

## 2020-12-17 ENCOUNTER — Other Ambulatory Visit: Payer: Self-pay

## 2020-12-17 NOTE — Progress Notes (Signed)
Spoke w/ via phone for pre-op interview--- PT Lab needs dos---- no              Lab results------ no COVID test ------ done 12-15-2020 negative result in epic Arrive at ------- 0630 NPO after MN NO Solid Food.  Clear liquids from MN until--- 0530 Medications to take morning of surgery ----- NONE Diabetic medication ----- n/a Patient Special Instructions ----- n/a Pre-Op special Istructions ----- n/a Patient verbalized understanding of instructions that were given at this phone interview. Patient denies shortness of breath, chest pain, fever, cough at this phone interview.

## 2020-12-18 NOTE — Anesthesia Preprocedure Evaluation (Addendum)
Anesthesia Evaluation  Patient identified by MRN, date of birth, ID band Patient awake    Reviewed: Allergy & Precautions, NPO status , Patient's Chart, lab work & pertinent test results  Airway Mallampati: II  TM Distance: >3 FB Neck ROM: Full    Dental  (+) Missing, Dental Advisory Given,    Pulmonary neg pulmonary ROS, former smoker,    Pulmonary exam normal breath sounds clear to auscultation       Cardiovascular negative cardio ROS Normal cardiovascular exam Rhythm:Regular Rate:Normal     Neuro/Psych negative neurological ROS  negative psych ROS   GI/Hepatic Neg liver ROS, GERD  Controlled,  Endo/Other  negative endocrine ROS  Renal/GU negative Renal ROS  negative genitourinary   Musculoskeletal  (+) Arthritis ,   Abdominal   Peds  Hematology  (+) Blood dyscrasia (Hgb 10), anemia ,   Anesthesia Other Findings   Reproductive/Obstetrics                            Anesthesia Physical Anesthesia Plan  ASA: II  Anesthesia Plan: MAC   Post-op Pain Management:    Induction: Intravenous  PONV Risk Score and Plan: 1 and Ondansetron and Propofol infusion  Airway Management Planned: Natural Airway and Simple Face Mask  Additional Equipment: None  Intra-op Plan:   Post-operative Plan:   Informed Consent:     Dental advisory given  Plan Discussed with: CRNA  Anesthesia Plan Comments: (Lab Results      Component                Value               Date                      WBC                      7.1                 09/16/2020                HGB                      10.0 (L)            09/16/2020                HCT                      31.2 (L)            09/16/2020                MCV                      102.0 (H)           09/16/2020                PLT                      198                 09/16/2020           )       Anesthesia Quick Evaluation

## 2020-12-19 ENCOUNTER — Ambulatory Visit (HOSPITAL_BASED_OUTPATIENT_CLINIC_OR_DEPARTMENT_OTHER): Payer: Medicare Other | Admitting: Anesthesiology

## 2020-12-19 ENCOUNTER — Ambulatory Visit (HOSPITAL_BASED_OUTPATIENT_CLINIC_OR_DEPARTMENT_OTHER)
Admission: RE | Admit: 2020-12-19 | Discharge: 2020-12-19 | Disposition: A | Discharge status: Home / Self Care | Payer: Medicare Other | Attending: Surgery | Admitting: Surgery

## 2020-12-19 ENCOUNTER — Encounter (HOSPITAL_BASED_OUTPATIENT_CLINIC_OR_DEPARTMENT_OTHER)
Admission: RE | Disposition: A | Discharge status: Home / Self Care | Payer: Self-pay | Source: Home / Self Care | Attending: Surgery

## 2020-12-19 ENCOUNTER — Other Ambulatory Visit: Payer: Self-pay

## 2020-12-19 ENCOUNTER — Encounter (HOSPITAL_BASED_OUTPATIENT_CLINIC_OR_DEPARTMENT_OTHER): Payer: Self-pay | Admitting: Surgery

## 2020-12-19 DIAGNOSIS — R1900 Intra-abdominal and pelvic swelling, mass and lump, unspecified site: Secondary | ICD-10-CM | POA: Diagnosis not present

## 2020-12-19 DIAGNOSIS — Z79899 Other long term (current) drug therapy: Secondary | ICD-10-CM | POA: Insufficient documentation

## 2020-12-19 DIAGNOSIS — Z885 Allergy status to narcotic agent status: Secondary | ICD-10-CM | POA: Insufficient documentation

## 2020-12-19 DIAGNOSIS — R1903 Right lower quadrant abdominal swelling, mass and lump: Secondary | ICD-10-CM | POA: Diagnosis not present

## 2020-12-19 DIAGNOSIS — Z87891 Personal history of nicotine dependence: Secondary | ICD-10-CM | POA: Diagnosis not present

## 2020-12-19 DIAGNOSIS — L988 Other specified disorders of the skin and subcutaneous tissue: Secondary | ICD-10-CM | POA: Diagnosis not present

## 2020-12-19 DIAGNOSIS — L948 Other specified localized connective tissue disorders: Secondary | ICD-10-CM | POA: Diagnosis not present

## 2020-12-19 DIAGNOSIS — E559 Vitamin D deficiency, unspecified: Secondary | ICD-10-CM | POA: Diagnosis not present

## 2020-12-19 DIAGNOSIS — R222 Localized swelling, mass and lump, trunk: Secondary | ICD-10-CM | POA: Diagnosis not present

## 2020-12-19 DIAGNOSIS — L928 Other granulomatous disorders of the skin and subcutaneous tissue: Secondary | ICD-10-CM | POA: Diagnosis not present

## 2020-12-19 DIAGNOSIS — Z7982 Long term (current) use of aspirin: Secondary | ICD-10-CM | POA: Insufficient documentation

## 2020-12-19 DIAGNOSIS — R1904 Left lower quadrant abdominal swelling, mass and lump: Secondary | ICD-10-CM | POA: Diagnosis not present

## 2020-12-19 DIAGNOSIS — R229 Localized swelling, mass and lump, unspecified: Secondary | ICD-10-CM | POA: Diagnosis present

## 2020-12-19 DIAGNOSIS — D649 Anemia, unspecified: Secondary | ICD-10-CM | POA: Diagnosis not present

## 2020-12-19 DIAGNOSIS — L989 Disorder of the skin and subcutaneous tissue, unspecified: Secondary | ICD-10-CM | POA: Diagnosis not present

## 2020-12-19 DIAGNOSIS — Z888 Allergy status to other drugs, medicaments and biological substances status: Secondary | ICD-10-CM | POA: Diagnosis not present

## 2020-12-19 HISTORY — DX: Unspecified corneal scar and opacity: H17.9

## 2020-12-19 HISTORY — DX: Visual discomfort, bilateral: H53.143

## 2020-12-19 HISTORY — DX: Personal history of other diseases of the digestive system: Z87.19

## 2020-12-19 HISTORY — DX: Iron deficiency anemia, unspecified: D50.9

## 2020-12-19 HISTORY — PX: MASS EXCISION: SHX2000

## 2020-12-19 HISTORY — DX: Other constipation: K59.09

## 2020-12-19 HISTORY — DX: Localized swelling, mass and lump, unspecified: R22.9

## 2020-12-19 HISTORY — DX: Presence of spectacles and contact lenses: Z97.3

## 2020-12-19 SURGERY — EXCISION MASS
Anesthesia: Monitor Anesthesia Care | Site: Abdomen

## 2020-12-19 MED ORDER — ONDANSETRON HCL 4 MG/2ML IJ SOLN
INTRAMUSCULAR | Status: DC | PRN
  Administered 2020-12-19: 4 mg via INTRAVENOUS

## 2020-12-19 MED ORDER — FENTANYL CITRATE (PF) 100 MCG/2ML IJ SOLN
INTRAMUSCULAR | Status: DC | PRN
  Administered 2020-12-19: 25 ug via INTRAVENOUS

## 2020-12-19 MED ORDER — CHLORHEXIDINE GLUCONATE CLOTH 2 % EX PADS: 6.0000 | MEDICATED_PAD | Freq: Once | CUTANEOUS | Status: DC

## 2020-12-19 MED ORDER — PROPOFOL 10 MG/ML IV BOLUS
INTRAVENOUS | Status: DC | PRN
  Administered 2020-12-19: 30 mg via INTRAVENOUS

## 2020-12-19 MED ORDER — DEXMEDETOMIDINE (PRECEDEX) IN NS 20 MCG/5ML (4 MCG/ML) IV SYRINGE
PREFILLED_SYRINGE | INTRAVENOUS | Status: DC | PRN
  Administered 2020-12-19: 2 ug via INTRAVENOUS
  Administered 2020-12-19: 4 ug via INTRAVENOUS
  Administered 2020-12-19: 2 ug via INTRAVENOUS

## 2020-12-19 MED ORDER — PROPOFOL 10 MG/ML IV BOLUS
INTRAVENOUS | Status: AC
  Filled 2020-12-19: qty 20

## 2020-12-19 MED ORDER — ACETAMINOPHEN 500 MG PO TABS: 1000.0000 mg | ORAL_TABLET | Freq: Four times a day (QID) | ORAL | 0 refills | Status: AC | PRN

## 2020-12-19 MED ORDER — CEFAZOLIN SODIUM-DEXTROSE 2-4 GM/100ML-% IV SOLN
2.0000 g | INTRAVENOUS | Status: AC
  Administered 2020-12-19: 2 g via INTRAVENOUS

## 2020-12-19 MED ORDER — ACETAMINOPHEN 500 MG PO TABS
1000.0000 mg | ORAL_TABLET | ORAL | Status: AC
  Administered 2020-12-19: 1000 mg via ORAL

## 2020-12-19 MED ORDER — ACETAMINOPHEN 500 MG PO TABS: 1000.0000 mg | ORAL_TABLET | Freq: Once | ORAL | Status: DC

## 2020-12-19 MED ORDER — PROPOFOL 500 MG/50ML IV EMUL
INTRAVENOUS | Status: DC | PRN
  Administered 2020-12-19: 100 ug/kg/min via INTRAVENOUS

## 2020-12-19 MED ORDER — FENTANYL CITRATE (PF) 100 MCG/2ML IJ SOLN: 25.0000 ug | INTRAMUSCULAR | Status: DC | PRN

## 2020-12-19 MED ORDER — LIDOCAINE HCL (PF) 2 % IJ SOLN
INTRAMUSCULAR | Status: AC
  Filled 2020-12-19: qty 5

## 2020-12-19 MED ORDER — HYDROCODONE-ACETAMINOPHEN 5-325 MG PO TABS: 1.0000 | ORAL_TABLET | ORAL | 0 refills | Status: AC | PRN

## 2020-12-19 MED ORDER — CEFAZOLIN SODIUM-DEXTROSE 2-4 GM/100ML-% IV SOLN
INTRAVENOUS | Status: AC
  Filled 2020-12-19: qty 100

## 2020-12-19 MED ORDER — BUPIVACAINE-EPINEPHRINE 0.5% -1:200000 IJ SOLN
INTRAMUSCULAR | Status: DC | PRN
  Administered 2020-12-19: 31 mL

## 2020-12-19 MED ORDER — EPHEDRINE 5 MG/ML INJ
INTRAVENOUS | Status: AC
  Filled 2020-12-19: qty 10

## 2020-12-19 MED ORDER — ACETAMINOPHEN 500 MG PO TABS
ORAL_TABLET | ORAL | Status: AC
  Filled 2020-12-19: qty 2

## 2020-12-19 MED ORDER — PROPOFOL 500 MG/50ML IV EMUL
INTRAVENOUS | Status: AC
  Filled 2020-12-19: qty 50

## 2020-12-19 MED ORDER — LIDOCAINE HCL (CARDIAC) PF 100 MG/5ML IV SOSY
PREFILLED_SYRINGE | INTRAVENOUS | Status: DC | PRN
  Administered 2020-12-19: 60 mg via INTRAVENOUS

## 2020-12-19 MED ORDER — LACTATED RINGERS IV SOLN
INTRAVENOUS | Status: DC
  Administered 2020-12-19: 50 mL via INTRAVENOUS

## 2020-12-19 MED ORDER — IBUPROFEN 800 MG PO TABS: 800.0000 mg | ORAL_TABLET | Freq: Three times a day (TID) | ORAL | 0 refills | Status: AC

## 2020-12-19 MED ORDER — FENTANYL CITRATE (PF) 100 MCG/2ML IJ SOLN
INTRAMUSCULAR | Status: AC
  Filled 2020-12-19: qty 2

## 2020-12-19 MED ORDER — EPHEDRINE SULFATE-NACL 50-0.9 MG/10ML-% IV SOSY
PREFILLED_SYRINGE | INTRAVENOUS | Status: DC | PRN
  Administered 2020-12-19: 10 mg via INTRAVENOUS

## 2020-12-19 MED ORDER — ONDANSETRON HCL 4 MG/2ML IJ SOLN
INTRAMUSCULAR | Status: AC
  Filled 2020-12-19: qty 2

## 2020-12-19 SURGICAL SUPPLY — 49 items
ADH SKN CLS APL DERMABOND .7 (GAUZE/BANDAGES/DRESSINGS) ×1
APL PRP STRL LF DISP 70% ISPRP (MISCELLANEOUS) ×1
BLADE SURG 15 STRL LF DISP TIS (BLADE) ×1 IMPLANT
BLADE SURG 15 STRL SS (BLADE) ×2
BRIEF STRETCH FOR OB PAD LRG (UNDERPADS AND DIAPERS) IMPLANT
CHLORAPREP W/TINT 26 (MISCELLANEOUS) ×2 IMPLANT
COVER BACK TABLE 60X90IN (DRAPES) ×2 IMPLANT
COVER MAYO STAND STRL (DRAPES) ×2 IMPLANT
COVER WAND RF STERILE (DRAPES) ×2 IMPLANT
DERMABOND ADVANCED (GAUZE/BANDAGES/DRESSINGS) ×1
DERMABOND ADVANCED .7 DNX12 (GAUZE/BANDAGES/DRESSINGS) ×1 IMPLANT
DRAPE LAPAROSCOPIC ABDOMINAL (DRAPES) ×2 IMPLANT
DRAPE LAPAROTOMY 100X72 PEDS (DRAPES) IMPLANT
DRAPE LAPAROTOMY TRNSV 102X78 (DRAPES) IMPLANT
DRAPE UTILITY XL STRL (DRAPES) ×2 IMPLANT
DRSG PAD ABDOMINAL 8X10 ST (GAUZE/BANDAGES/DRESSINGS) IMPLANT
ELECT REM PT RETURN 9FT ADLT (ELECTROSURGICAL) ×2
ELECTRODE REM PT RTRN 9FT ADLT (ELECTROSURGICAL) ×1 IMPLANT
GAUZE SPONGE 4X4 12PLY STRL (GAUZE/BANDAGES/DRESSINGS) ×2 IMPLANT
GLOVE BIO SURGEON STRL SZ7.5 (GLOVE) ×2 IMPLANT
GLOVE BIOGEL PI IND STRL 6 (GLOVE) IMPLANT
GLOVE BIOGEL PI INDICATOR 6 (GLOVE)
GLOVE SURG ENC MOIS LTX SZ6 (GLOVE) IMPLANT
GLOVE SURG UNDER LTX SZ6.5 (GLOVE) IMPLANT
GOWN STRL REUS W/TWL LRG LVL3 (GOWN DISPOSABLE) ×2 IMPLANT
KIT SIGMOIDOSCOPE (SET/KITS/TRAYS/PACK) IMPLANT
KIT TURNOVER CYSTO (KITS) ×2 IMPLANT
NEEDLE HYPO 22GX1.5 SAFETY (NEEDLE) ×2 IMPLANT
NEEDLE HYPO 25X1 1.5 SAFETY (NEEDLE) IMPLANT
PACK BASIN DAY SURGERY FS (CUSTOM PROCEDURE TRAY) ×2 IMPLANT
PENCIL SMOKE EVACUATOR (MISCELLANEOUS) ×2 IMPLANT
SPONGE LAP 18X18 RF (DISPOSABLE) IMPLANT
SPONGE LAP 4X18 RFD (DISPOSABLE) IMPLANT
STAPLER VISISTAT 35W (STAPLE) IMPLANT
SUCTION FRAZIER HANDLE 10FR (MISCELLANEOUS)
SUCTION TUBE FRAZIER 10FR DISP (MISCELLANEOUS) IMPLANT
SUT CHROMIC 3 0 SH 27 (SUTURE) IMPLANT
SUT MNCRL AB 4-0 PS2 18 (SUTURE) ×2 IMPLANT
SUT VIC AB 2-0 SH 27 (SUTURE)
SUT VIC AB 2-0 SH 27XBRD (SUTURE) IMPLANT
SUT VIC AB 3-0 SH 18 (SUTURE) ×2 IMPLANT
SUT VIC AB 3-0 SH 27 (SUTURE) ×2
SUT VIC AB 3-0 SH 27X BRD (SUTURE) ×1 IMPLANT
SYR BULB IRRIG 60ML STRL (SYRINGE) ×2 IMPLANT
SYR CONTROL 10ML LL (SYRINGE) ×2 IMPLANT
TOWEL OR 17X26 10 PK STRL BLUE (TOWEL DISPOSABLE) ×2 IMPLANT
TRAY DSU PREP LF (CUSTOM PROCEDURE TRAY) IMPLANT
TUBE CONNECTING 12X1/4 (SUCTIONS) ×2 IMPLANT
YANKAUER SUCT BULB TIP NO VENT (SUCTIONS) ×2 IMPLANT

## 2020-12-19 NOTE — H&P (Signed)
Admitting Physician: Hyman Hopes Rodolph Hagemann  Service: General surgery  CC: bilateral lower abdominal subcutaneous masses  Subjective   HPI: Amanda West is an 75 y.o. female who is here for elective excision of bilateral lower abdominal subcutaneous masses.  Past Medical History:  Diagnosis Date  . Abnormality of gait    previous was seen by neurology-- dr c. Anne Hahn--- relased lov 08-31-2019, much improved per pt  . Chronic constipation   . Cornea scar    right eye  . Eyes sensitive to light, bilateral    per pt right > left due to residual shingles   . IDA (iron deficiency anemia)   . Osteoporosis   . Spinal stenosis in cervical region 09/15/2014  . Subcutaneous mass    bilateral abdominal   . Vitamin D deficiency   . Wears glasses     Past Surgical History:  Procedure Laterality Date  . ABDOMINAL HERNIA REPAIR  1980s  . ANTERIOR CERVICAL DECOMP/DISCECTOMY FUSION N/A 09/21/2017   Procedure: Anterior Cervical Decompression Fusion Cervical Four-Five,Cervical Five-Six,Cervical Six-Seven;  Surgeon: Julio Sicks, MD;  Location: [facility] OR;  Service: Neurosurgery;  Laterality: N/A;  anterior   . CATARACT EXTRACTION W/ INTRAOCULAR LENS  IMPLANT, BILATERAL  1990's  . PARS PLANA VITRECTOMY  07/06/2012   Procedure: PARS PLANA VITRECTOMY WITH 25G REMOVAL/SUTURE INTRAOCULAR LENS;  Surgeon: Sherrie George, MD;  Location: [facility] OR;  Service: Ophthalmology;  Laterality: Left;  Marland Kitchen VITRECTOMY AND CATARACT Right ?   IOL replaced    Family History  Problem Relation Age of Onset  . Heart disease Mother   . Heart disease Father   . Diabetes Sister   . Cancer Brother   . Heart disease Brother     Social:  reports that she quit smoking about 7 years ago. She quit after 30.00 years of use. She has never used smokeless tobacco. She reports that she does not drink alcohol and does not use drugs.  Allergies:  Allergies  Allergen Reactions  . Codeine Nausea Only  . Nortriptyline     Blurred  vision    Medications: Current Outpatient Medications  Medication Instructions  . aspirin EC 81 mg, Oral, Daily with supper  . Capsicum, Cayenne, (CAYENNE PEPPER PO) 1,000 mg, Oral, Daily  . Cholecalciferol (VITAMIN D-3) 125 MCG (5000 UT) TABS Oral, Daily  . Chromium Aspartate 1,000 mcg, Oral, Daily with supper  . Chromium-Cinnamon (CINNAMON PLUS CHROMIUM) [phone] MCG-MG CAPS Oral, 2 times daily  . DULoxetine (CYMBALTA) 20 MG capsule TAKE 1 CAPSULE BY MOUTH ONCE DAILY  . folic acid (FOLVITE) 400 mcg, Daily with supper  . Green Tea, Camellia sinensis, 250 MG CAPS Oral, Daily  . Iron 325 mg, Oral, Daily with supper  . Multiple Vitamins-Minerals (OCUVITE PO) Oral, Daily  . Omega-3 Fatty Acids (OMEGA-3 FISH OIL PO) 1,400 mg, Oral, Daily at bedtime  . Polyethyl Glyc-Propyl Glyc PF 0.4-0.3 % SOLN 1-2 drops, Both Eyes, 3 times daily PRN  . Potassium 99 mg, Daily  . senna (SENOKOT) 8.6 MG tablet 5 tablets, Oral, Daily at bedtime  . simethicone (MYLICON) 80 mg, Oral, Every 6 hours PRN  . vitamin B-12 (CYANOCOBALAMIN) 1,000 mcg, Oral, Daily  . vitamin C (ASCORBIC ACID) 500 mg, Oral, Daily with supper  . Zinc 50 MG CAPS 1 capsule, Oral, OTC     ROS - all of the below systems have been reviewed with the patient and positives are indicated with bold text General: chills, fever or night sweats Eyes: blurry  vision or double vision ENT: epistaxis or sore throat Allergy/Immunology: itchy/watery eyes or nasal congestion Hematologic/Lymphatic: bleeding problems, blood clots or swollen lymph nodes Endocrine: temperature intolerance or unexpected weight changes Breast: new or changing breast lumps or nipple discharge Resp: cough, shortness of breath, or wheezing CV: chest pain or dyspnea on exertion GI: as per HPI GU: dysuria, trouble voiding, or hematuria MSK: joint pain or joint stiffness Neuro: TIA or stroke symptoms Derm: pruritus and skin lesion changes Psych: anxiety and  depression  Objective   PE Blood pressure 128/61, pulse 63, temperature 98.1 F (36.7 C), temperature source Oral, resp. rate 14, height 5\' 2"  (1.575 m), weight 53.6 kg, SpO2 100 %. Constitutional: NAD; conversant; no deformities Eyes: Moist conjunctiva; no lid lag; anicteric; PERRL Neck: Trachea midline; no thyromegaly Lungs: Normal respiratory effort; no tactile fremitus CV: RRR; no palpable thrills; no pitting edema GI: Abd Bilateral lower abdominal subcutaneous masses with overlying skin changes; no palpable hepatosplenomegaly MSK: Normal range of motion of extremities; no clubbing/cyanosis Psychiatric: Appropriate affect; alert and oriented x3 Lymphatic: No palpable cervical or axillary lymphadenopathy  No results found for this or any previous visit (from the past 24 hour(s)).  Imaging Orders  No imaging studies ordered today     Assessment and Plan   Amanda West is an 75 y.o. female with bilateral lower abdominal subcutaneous masses.  I recommended removal of these.  The procedure itself as well as its risks, benefits and alternatives have been discussed and the patient granted consent to proceed.  61, MD  [facility] Use [email or web address] to contact on call provider

## 2020-12-19 NOTE — Anesthesia Postprocedure Evaluation (Signed)
Anesthesia Post Note  Patient: Amanda West  Procedure(s) Performed: EXCISION OF RIGHT LOWER ABDOMEN SUBCUTANEOUS MASS, EXCISION OF LEFT LOWER ABDOMEN SUBCUTANEOUS MASS (N/A Abdomen)     Patient location during evaluation: PACU Anesthesia Type: MAC Level of consciousness: awake and alert Pain management: pain level controlled Vital Signs Assessment: post-procedure vital signs reviewed and stable Respiratory status: spontaneous breathing, nonlabored ventilation, respiratory function stable and patient connected to nasal cannula oxygen Cardiovascular status: stable and blood pressure returned to baseline Postop Assessment: no apparent nausea or vomiting Anesthetic complications: no   No complications documented.  Last Vitals:  Vitals:   12/19/20 1000 12/19/20 1100  BP: (!) 114/54 (!) 177/64  Pulse: 69 (!) 57  Resp: 13 15  Temp:  (!) 36.3 C  SpO2: 95% 100%    Last Pain:  Vitals:   12/19/20 1100  TempSrc:   PainSc: 0-No pain                 Debroh Sieloff L Baneza Bartoszek

## 2020-12-19 NOTE — Anesthesia Procedure Notes (Signed)
Procedure Name: MAC Date/Time: 12/19/2020 8:30 AM Performed by: Mechele Claude, CRNA Pre-anesthesia Checklist: Patient identified, Emergency Drugs available, Suction available and Patient being monitored Oxygen Delivery Method: Simple face mask Placement Confirmation: positive ETCO2 and breath sounds checked- equal and bilateral

## 2020-12-19 NOTE — Discharge Instructions (Signed)
Call your surgeon if you experience:   1.  Fever over 101.0. 2.  Inability to urinate. 3.  Nausea and/or vomiting. 4.  Extreme swelling or bruising at the surgical site. 5.  Continued bleeding from the incision. 6.  Increased pain, redness or drainage from the incision. 7.  Problems related to your pain medication. 8.  Any problems and/or concerns     Incision Care, Adult An incision is a cut that a doctor makes in your skin for surgery. Most times, these cuts are closed after surgery. Your cut from surgery may be closed with:  Stitches (sutures).  Staples.  Skin glue.  Skin tape (adhesive) strips. You may need to return to your doctor to have stitches or staples taken out. This may happen many days or many weeks after your surgery. You need to take good care of your cut so it does not get infected. Follow instructions from your doctor about how to care for your cut. Supplies needed:  Soap and water.  A clean hand towel.  Wound cleanser.  A clean bandage (dressing), if needed.  Cream or ointment, if told by your doctor.  Clean gauze. How to care for your cut from surgery Cleaning your cut Ask your doctor how to clean your cut. You may need to:  Use mild soap and water, or a wound cleanser.  Use a clean gauze to pat your cut dry after you clean it. Changing your bandage  Wash your hands with soap and water for at least 20 seconds before and after you change your bandage. If you cannot use soap and water, use hand sanitizer.  Change your bandage as told by your doctor.  Leave stitches, staples, skin glue, or skin tape strips in place. They may need to stay in place for 2 weeks or longer. If tape strips get loose and curl up, you may trim the loose edges. Do not remove tape strips completely unless your doctor says it is okay.  Put a cream or ointment on your cut. Do this only as told.  Cover your cut with a clean bandage.  Ask your doctor when you can leave your  cut uncovered. Checking for infection Check your cut area every day for signs of infection. Check for:  More redness, swelling, or pain.  More fluid or blood.  Warmth.  Pus or a bad smell.   Follow these instructions at home Medicines  Take over-the-counter and prescription medicines only as told by your doctor.  If you were prescribed an antibiotic medicine, cream, or ointment, use it as told by your doctor. Do not stop using the antibiotic even if your condition improves. Eating and drinking  Eat foods that have a lot of certain nutrients, such as protein, vitamin A, and vitamin C. These foods help your cut heal. ? Foods rich in protein include meat, fish, eggs, dairy, beans, and nuts. ? Foods rich in vitamin A include carrots and dark green, leafy vegetables. ? Foods rich in vitamin C include citrus fruits, tomatoes, broccoli, and peppers.  Drink enough fluid to keep your pee (urine) pale yellow. General instructions  Do not take baths, swim, use a hot tub, or put your cut underwater until your doctor approves. Ask your doctor if you may take showers. You may only be allowed to take sponge baths.  Limit movement around your cut. This helps with healing. ? Try not to strain, lift, or exercise for the first 2 weeks, or for as long as  told by your doctor. ? Return to your normal activities as told by your doctor. Ask your doctor what activities are safe for you.  Do not scratch or pick at your cut. Keep it covered as told by your doctor.  Protect your cut from the sun when you are outside for the first 6 months, or for as long as told by your doctor. Cover up the scar area or put on sunscreen that has an SPF of at least 30.  Do not use any products that contain nicotine or tobacco, such as cigarettes, e-cigarettes, and chewing tobacco. These can delay cut healing after surgery. If you need help quitting, ask your doctor.  Keep all follow-up visits as told by your doctor. This  is important.   Contact a doctor if:  You have any of these signs of infection around your cut: ? More redness, swelling, or pain. ? More fluid or blood. ? Warmth. ? Pus or a bad smell.  You have a fever.  You feel like you may vomit (nauseous).  You vomit.  You are dizzy.  Your stitches, staples, skin glue, or tape strips come undone. Get help right away if:  Your cut has a red streak coming from it.  Your cut bleeds through your bandage, and bleeding does not stop with gentle pressure.  Your cut opens up and comes apart.  Your body reacts very badly to an infection. This may include: ? A fever, chills, or feeling cold. ? Feeling mixed up, worried, or nervous. ? Very bad pain. ? Trouble breathing. ? A fast heartbeat. ? Clammy or sweaty skin. ? A rash. These symptoms may be an emergency. Do not wait to see if the symptoms will go away. Get medical help right away. Call your local emergency services (911 in the [place] Do not drive yourself to the hospital. Summary  Follow instructions from your doctor about how to care for your cut from surgery.  Wash your hands with soap and water for at least 20 seconds before and after you change your bandage. If you cannot use soap and water, use hand sanitizer.  Check your cut area every day for signs of infection.  Keep all follow-up visits as told by your doctor. This is important. This information is not intended to replace advice given to you by your health care provider. Make sure you discuss any questions you have with your health care provider. Document Revised: 09/14/2019 Document Reviewed: 09/14/2019 Elsevier Patient Education  2021 [employer] Inc[employer]    Post Anesthesia Home Care Instructions  Activity: Get plenty of rest for the remainder of the day. A responsible individual must stay with you for 24 hours following the procedure.  For the next 24 hours, DO NOT: -Drive a car -Advertising copywriter -Drink alcoholic  beverages -Take any medication unless instructed by your physician -Make any legal decisions or sign important papers.  Meals: Start with liquid foods such as gelatin or soup. Progress to regular foods as tolerated. Avoid greasy, spicy, heavy foods. If nausea and/or vomiting occur, drink only clear liquids until the nausea and/or vomiting subsides. Call your physician if vomiting continues.  Special Instructions/Symptoms: Your throat may feel dry or sore from the anesthesia or the breathing tube placed in your throat during surgery. If this causes discomfort, gargle with warm salt water. The discomfort should disappear within 24 hours.

## 2020-12-19 NOTE — Op Note (Signed)
   Patient: Amanda West (01/09/1946, [id number])  Date of Surgery: 12/19/2020   Preoperative Diagnosis: LEFT LOWER ABDOMEN SUBCUTANEOUS MASS, AND RIGHT LOWER ABDOMEN   Postoperative Diagnosis: LEFT LOWER ABDOMEN SUBCUTANEOUS MASS, AND RIGHT LOWER ABDOMEN   Surgical Procedure: EXCISION OF RIGHT LOWER ABDOMEN SUBCUTANEOUS MASS, EXCISION OF LEFT LOWER ABDOMEN SUBCUTANEOUS MASS:    Operative Team Members:  Surgeon(s) and Role:    * Nihal Marzella, Hyman Hopes, MD - Primary   Anesthesiologist: Elmer Picker, MD CRNA: Norva Pavlov, CRNA   Anesthesia: Monitor Anesthesia Care   Fluids:  Total I/O In: 100 [IV Piggyback:100] Out: -   Complications: None  Drains:  none   Specimen:  ID Type Source Tests Collected by Time Destination  1 : right lower abdominal wall mass Tissue PATH Soft tissue SURGICAL PATHOLOGY Danuel Felicetti, Hyman Hopes, MD 12/19/2020 [phone]   2 : left lower abdominal wall mass Tissue PATH Soft tissue SURGICAL PATHOLOGY Cloma Rahrig, Hyman Hopes, MD 12/19/2020 1191      Disposition:  PACU - hemodynamically stable.  Plan of Care: Discharge to home after PACU    Indications for Procedure: Amanda West is a 75 y.o. female who presented with bilateral lower abdominal wall subcutaneous masses.  The procedure itself as well as its risk benefits and alternatives were discussed with the patient the patient granted consent to proceed  Findings: Bilateral lower abdominal wall subcutaneous masses Left: 1 cm x 2 cm Right: 3 cm x 2 cm  Description of Procedure: On the date stated above patient taken to operating suite placed supine position.  Monitored anesthesia care was provided.  Ancef was given prior to the case start.  A timeout was completed verifying correct patient, positioning, procedure, and equipment needed for the case.  The patient's abdomen was prepped and draped draped in usual sterile fashion.  I began with the right lower abdominal wall subcutaneous mass.  I made an  elliptical incision around the area to excise the abnormal overlying skin.  I dissected through the subcutaneous tissues to circumferentially remove the mass using electrocautery.  Hemostasis was obtained.  The wound was irrigated and then closed in layers using 3-0 Vicryl suture and 4-0 Monocryl.  Dermabond was applied.  This mass measured 3 cm x 2 cm.  Next I repeated this procedure on the left abdominal wall subcutaneous mass. I made an elliptical incision around the area to excise the abnormal overlying skin.  I dissected through the subcutaneous tissues to circumferentially remove the mass using electrocautery.  Hemostasis was obtained.  The wound was irrigated and then closed in layers using 3-0 Vicryl suture and 4-0 Monocryl.  Dermabond was applied.  This mass measured 1 cm x 2 cm.  The patient was awoken from anesthesia and transferred to the postanesthesia care unit in stable condition.   Ivar Drape, MD General, Bariatric, & Minimally Invasive Surgery [facility], [place]

## 2020-12-19 NOTE — Transfer of Care (Signed)
Immediate Anesthesia Transfer of Care Note  Patient: Shaunta Oncale  Procedure(s) Performed: Procedure(s) (LRB): EXCISION OF RIGHT LOWER ABDOMEN SUBCUTANEOUS MASS, EXCISION OF LEFT LOWER ABDOMEN SUBCUTANEOUS MASS (N/A)  Patient Location: PACU  Anesthesia Type: General  Level of Consciousness: awake, alert  and oriented  Airway & Oxygen Therapy: Patient Spontanous Breathing and Patient connected to face mask oxygen  Post-op Assessment: Report given to PACU RN and Post -op Vital signs reviewed and stable  Post vital signs: Reviewed and stable  Complications: No apparent anesthesia complications  Last Vitals:  Vitals Value Taken Time  BP 123/58 12/19/20 0924  Temp    Pulse 67 12/19/20 0927  Resp 16 12/19/20 0927  SpO2 100 % 12/19/20 0927  Vitals shown include unvalidated device data.  Last Pain:  Vitals:   12/19/20 0655  TempSrc: Oral  PainSc: 0-No pain      Patients Stated Pain Goal: 4 (12/19/20 0086)  Complications: No complications documented.

## 2020-12-20 ENCOUNTER — Encounter (HOSPITAL_BASED_OUTPATIENT_CLINIC_OR_DEPARTMENT_OTHER): Payer: Self-pay | Admitting: Surgery

## 2020-12-24 LAB — SURGICAL PATHOLOGY

## 2021-01-30 DIAGNOSIS — H02884 Meibomian gland dysfunction left upper eyelid: Secondary | ICD-10-CM | POA: Diagnosis not present

## 2021-01-31 ENCOUNTER — Other Ambulatory Visit: Payer: Medicare Other

## 2021-04-04 ENCOUNTER — Other Ambulatory Visit: Payer: Self-pay

## 2021-04-04 ENCOUNTER — Ambulatory Visit
Admission: RE | Admit: 2021-04-04 | Discharge: 2021-04-04 | Disposition: A | Discharge status: Home / Self Care | Payer: Medicare Other | Source: Ambulatory Visit | Attending: Family Medicine | Admitting: Family Medicine

## 2021-04-04 DIAGNOSIS — M810 Age-related osteoporosis without current pathological fracture: Secondary | ICD-10-CM | POA: Diagnosis not present

## 2021-04-04 DIAGNOSIS — Z780 Asymptomatic menopausal state: Secondary | ICD-10-CM | POA: Diagnosis not present

## 2021-04-09 DIAGNOSIS — H04123 Dry eye syndrome of bilateral lacrimal glands: Secondary | ICD-10-CM | POA: Diagnosis not present

## 2021-04-16 DIAGNOSIS — H16121 Filamentary keratitis, right eye: Secondary | ICD-10-CM | POA: Diagnosis not present

## 2021-05-22 IMAGING — MG DIGITAL SCREENING BILAT W/ TOMO W/ CAD
8 series · 9 of 24 positions shown · non-contrast
Comparison: Previous exam(s).

CLINICAL DATA: Screening.

EXAM:
DIGITAL SCREENING BILATERAL MAMMOGRAM WITH TOMO AND CAD

[L CC synth-2D]
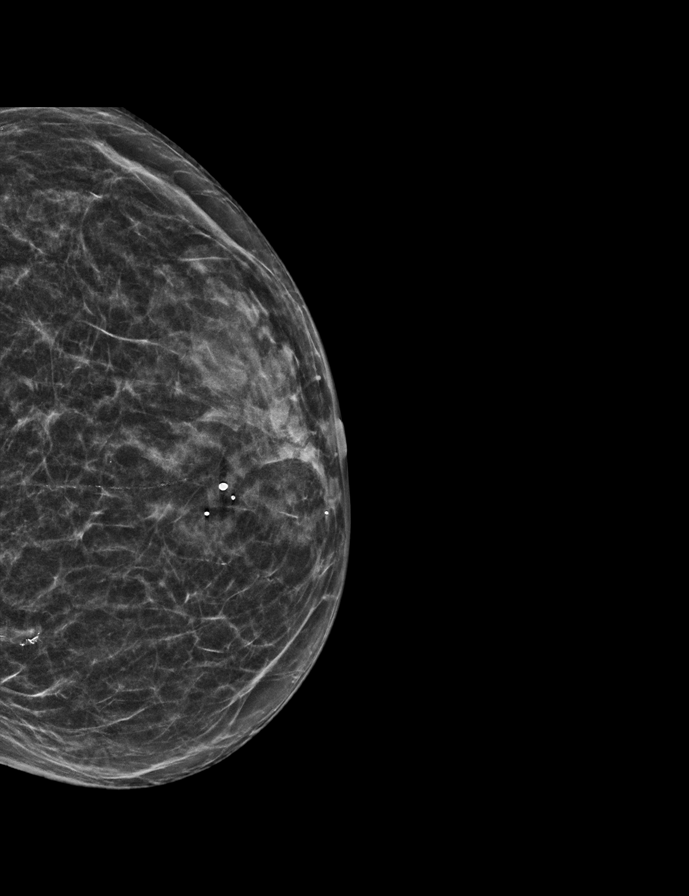

[R MLO synth-2D]
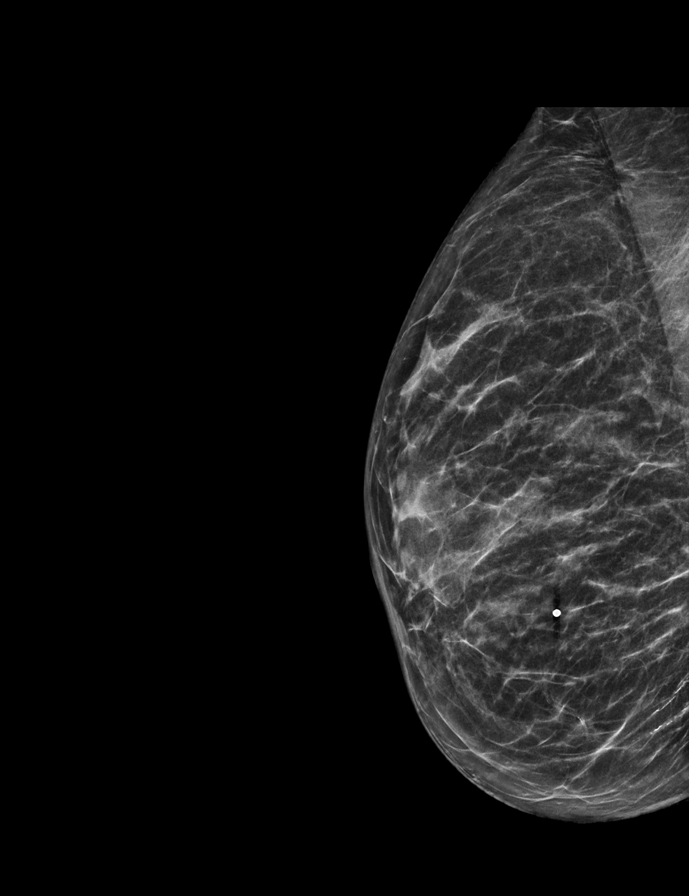

[L MLO synth-2D]
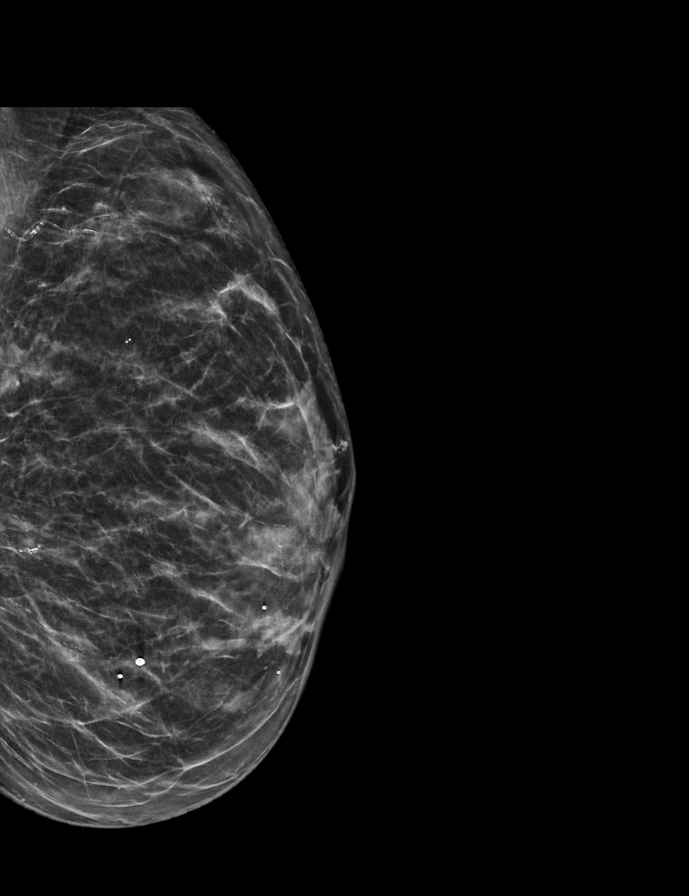

[R CC synth-2D]
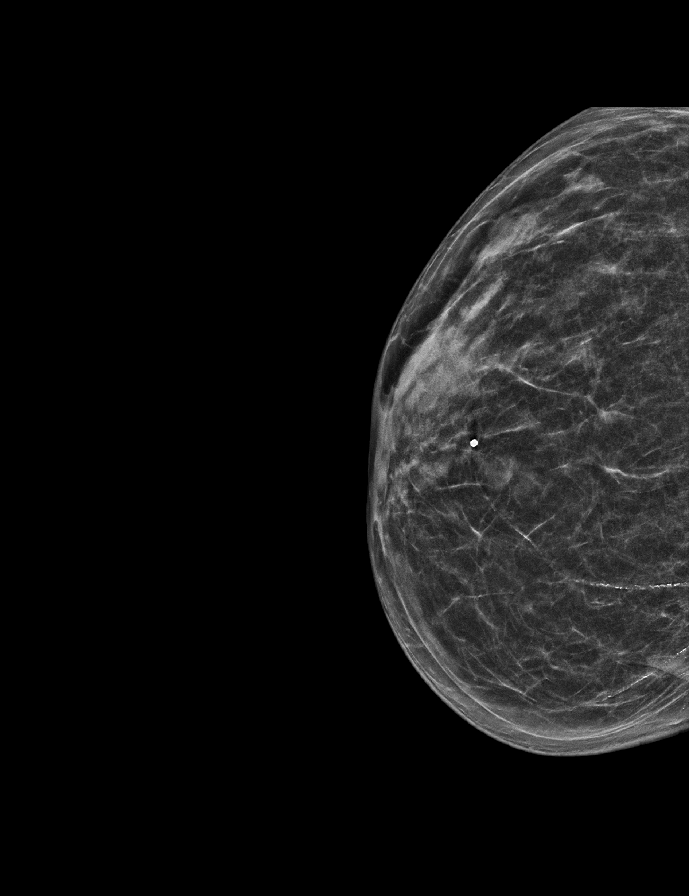

[R CC tomo · 2 of 50 frames shown]
[frame 17/50]
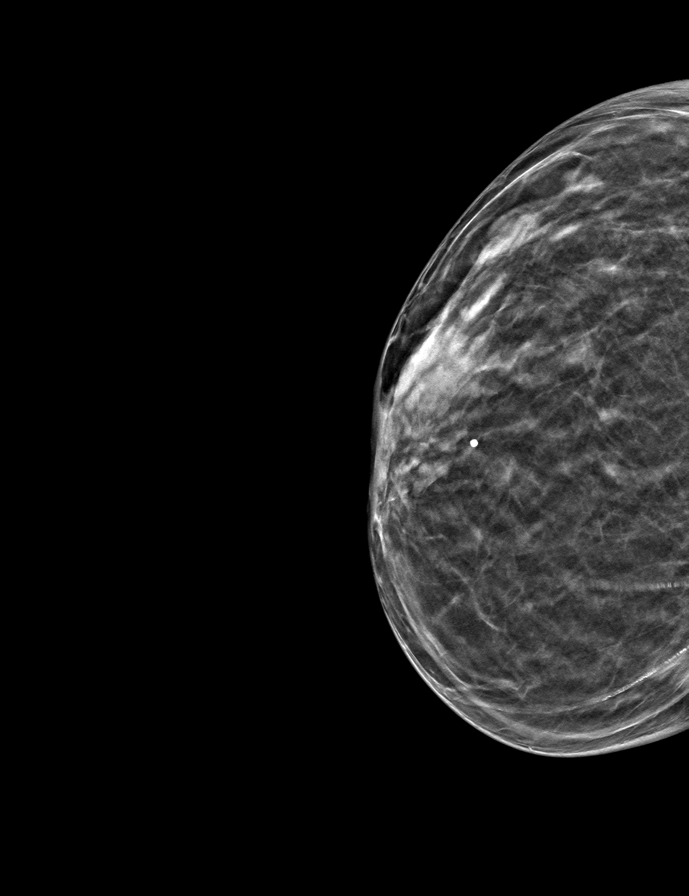
[frame 25/50]
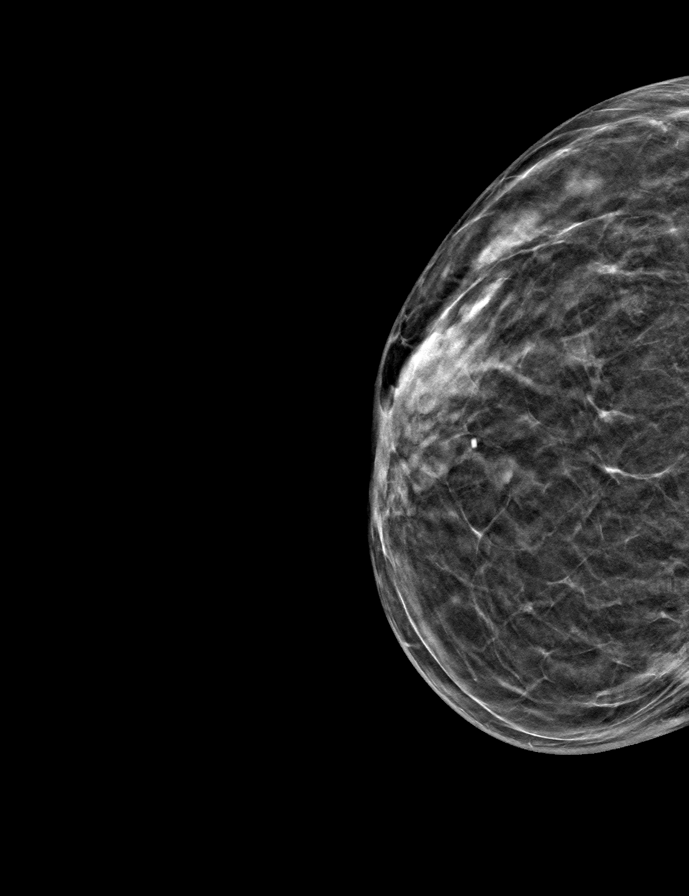

[R MLO tomo · tomo slice 28/55.0]
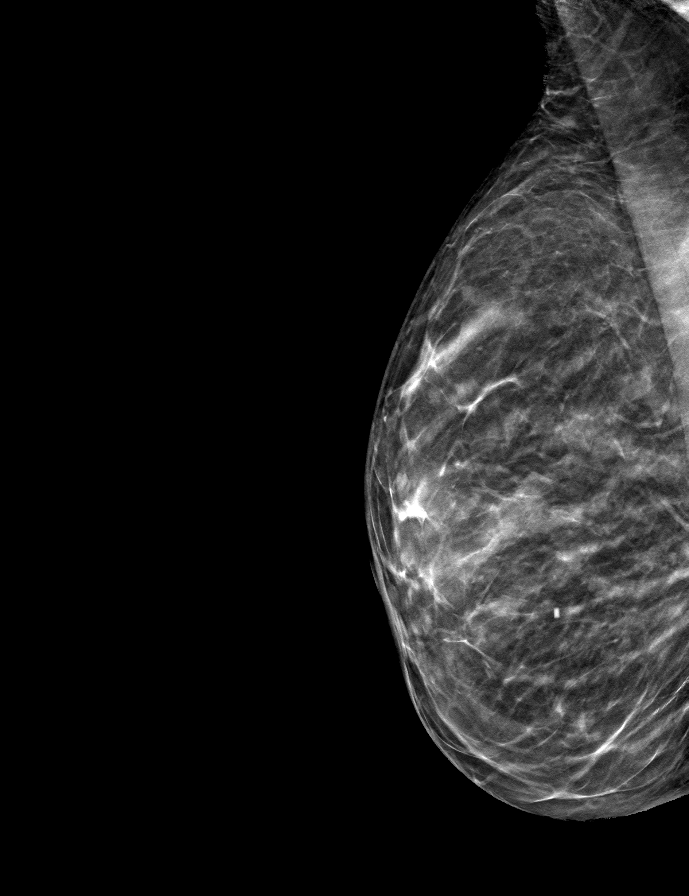

[L MLO tomo · tomo slice 29/56.0]
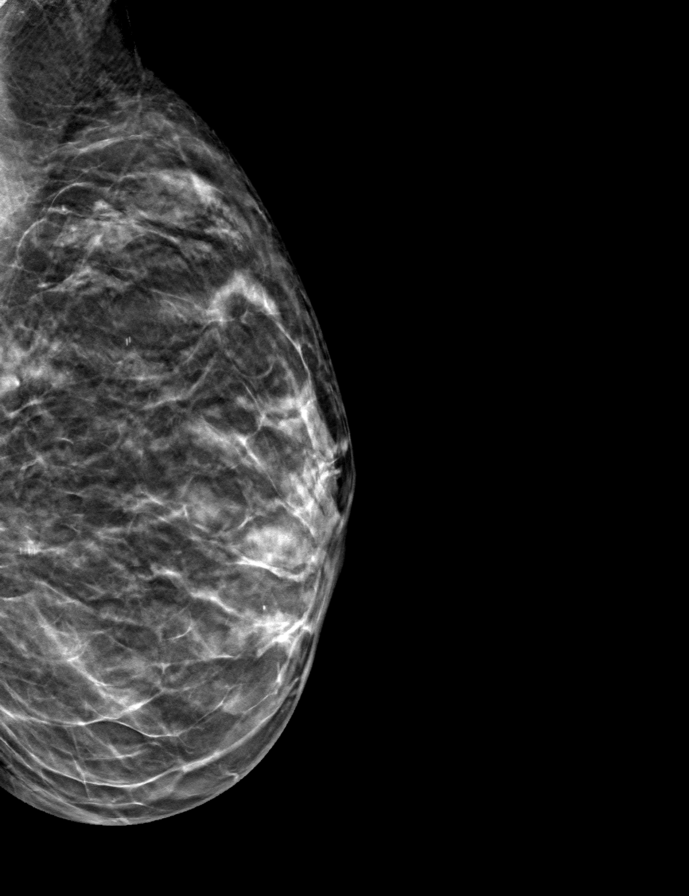

[L CC tomo · tomo slice 25/48.0]
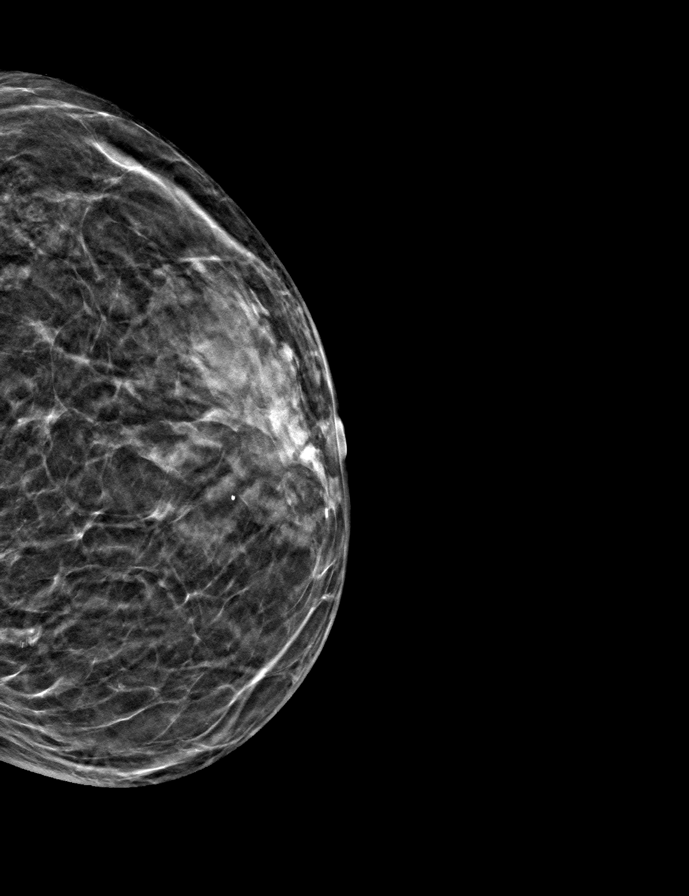

[9 of 24 positions shown; findings below may reference images not displayed]

ACR Breast Density Category c: The breast tissue is heterogeneously
dense, which may obscure small masses.
FINDINGS: There are no findings suspicious for malignancy. Images were
processed with CAD.
IMPRESSION: No mammographic evidence of malignancy. A result letter of this
screening mammogram will be mailed directly to the patient.

RECOMMENDATION:
Screening mammogram in one year. (Code:[id number])

BI-RADS CATEGORY  1: Negative.

## 2021-06-03 DIAGNOSIS — H00024 Hordeolum internum left upper eyelid: Secondary | ICD-10-CM | POA: Diagnosis not present

## 2021-06-26 DIAGNOSIS — R200 Anesthesia of skin: Secondary | ICD-10-CM | POA: Diagnosis not present

## 2021-06-26 DIAGNOSIS — R079 Chest pain, unspecified: Secondary | ICD-10-CM | POA: Diagnosis not present

## 2021-07-02 DIAGNOSIS — H18422 Band keratopathy, left eye: Secondary | ICD-10-CM | POA: Diagnosis not present

## 2021-07-03 DIAGNOSIS — H18422 Band keratopathy, left eye: Secondary | ICD-10-CM | POA: Diagnosis not present

## 2021-07-04 ENCOUNTER — Encounter: Payer: Self-pay | Admitting: Internal Medicine

## 2021-07-04 ENCOUNTER — Ambulatory Visit: Payer: Medicare Other | Admitting: Internal Medicine

## 2021-07-04 ENCOUNTER — Other Ambulatory Visit: Payer: Self-pay

## 2021-07-04 VITALS — BP 124/62 | HR 63 | Ht 62.0 in | Wt 114.0 lb

## 2021-07-04 DIAGNOSIS — D649 Anemia, unspecified: Secondary | ICD-10-CM | POA: Diagnosis not present

## 2021-07-04 DIAGNOSIS — R072 Precordial pain: Secondary | ICD-10-CM | POA: Diagnosis not present

## 2021-07-04 NOTE — Progress Notes (Signed)
Cardiology Office Note:    Date:  07/04/2021   ID:  Amanda West, DOB 10-21-1946, MRN [id number]  PCP:  Lupita RaiderShaw, Kimberlee, MD  Cardiologist:  None  Electrophysiologist:  None   Referring MD: Maurice SmallGriffin, Elaine, MD   Chief Complaint/Reason for Referral: Cardiac chest pain  History of Present Illness:    Amanda West is a 75 y.o. female with a history of iron deficiency anemia, osteoporosis and spinal stenosis. She presents today complaining of chest pain/chest tingling with exertion.  Today, she is accompanied by her husband who provides additional history.  She reports that while working in the yard, she felt a tightness in her chest. She says it caused her difficulty swallowing. This episode was accompanied with shortness of breath.   When she was making her bed, she had to sit down for some time because she could not keep up. She also experiences intermittent pain in her left shoulder. The tightness in the chest and arm pain are caused by exertion and they started in the last couple of months.These symptoms tend to go away after a few minutes when she sits down and takes deep breaths. She has not taken any medication to help with these symptoms.  She has a pressure in her head and manages the pressure with 20 mg duloxetine. She mentions she stopped taking the medications and was told recently to start the medication again. She has been taking it consistently and is not suffering any side effects.  She has been having severe pain in her left eye. She has a history of cornea and lens surgery. She has had to go to the ophthalmologist twice this month to treat the pain. She has been prescribed medication to help with the lens.  She used to be active by running but stopped when she was in her 30's. She also says the pressure in her head has stopped her from being as active as she would like. She also says she has trouble picking up her legs. She exercises by walking and using a treadmill at a  speed of 2 and elevation of 8. She also reports she does not get out in the sun often. She used to be a smoker and smoked half a pack a day. She stopped smoking 8 years ago when she got the flu during Christmas. She was a smoker for 30 years.  Her parents died from MI. Her brothers have had several heart surgeries.  She denies palpitations, lightheadedness, headaches, syncope, LE edema, orthopnea, PND.    Past Medical History:  Diagnosis Date   Abnormality of gait    previous was seen by neurology-- dr c. Anne Hahnwillis--- relased lov 08-31-2019, much improved per pt   Chronic constipation    Cornea scar    right eye   Eyes sensitive to light, bilateral    per pt right > left due to residual shingles    IDA (iron deficiency anemia)    Osteoporosis    Spinal stenosis in cervical region 09/15/2014   Subcutaneous mass    bilateral abdominal    Vitamin D deficiency    Wears glasses     Past Surgical History:  Procedure Laterality Date   ABDOMINAL HERNIA REPAIR  1980s   ANTERIOR CERVICAL DECOMP/DISCECTOMY FUSION N/A 09/21/2017   Procedure: Anterior Cervical Decompression Fusion Cervical Four-Five,Cervical Five-Six,Cervical Six-Seven;  Surgeon: Julio SicksPool, Henry, MD;  [facility];  Service: Neurosurgery;  Laterality: N/A;  anterior    CATARACT EXTRACTION W/ INTRAOCULAR LENS  IMPLANT, BILATERAL  1990's   MASS EXCISION N/A 12/19/2020   Procedure: EXCISION OF RIGHT LOWER ABDOMEN SUBCUTANEOUS MASS, EXCISION OF LEFT LOWER ABDOMEN SUBCUTANEOUS MASS;  Surgeon: Quentin Ore, MD;  Location: [facility];  Service: General;  Laterality: N/A;   PARS PLANA VITRECTOMY  07/06/2012   Procedure: PARS PLANA VITRECTOMY WITH 25G REMOVAL/SUTURE INTRAOCULAR LENS;  Surgeon: Sherrie George, MD;  Location: [facility] OR;  Service: Ophthalmology;  Laterality: Left;   VITRECTOMY AND CATARACT Right ?   IOL replaced    Current Medications: Current Meds  Medication Sig   acetaminophen (TYLENOL) 500 MG  tablet Take 2 tablets (1,000 mg total) by mouth every 6 (six) hours as needed.   aspirin EC 81 MG tablet Take 81 mg by mouth daily with supper.   Capsicum, Cayenne, (CAYENNE PEPPER PO) Take 1,000 mg by mouth daily.    Cholecalciferol (VITAMIN D-3) 125 MCG (5000 UT) TABS Take by mouth daily.   Chromium Aspartate 1000 MCG TABS Take 1,000 mcg by mouth daily with supper.    Chromium-Cinnamon (CINNAMON PLUS CHROMIUM) [phone] MCG-MG CAPS Take by mouth 2 (two) times daily.   DULoxetine (CYMBALTA) 20 MG capsule TAKE 1 CAPSULE BY MOUTH ONCE DAILY (Patient taking differently: Take 20 mg by mouth every other day. Takes at bedtime)   Ferrous Sulfate (IRON) 325 (65 FE) MG TABS Take 325 mg by mouth daily with supper.    folic acid (FOLVITE) 400 MCG tablet Take 400 mcg by mouth daily with supper.   Green Tea, Camellia sinensis, 250 MG CAPS Take by mouth daily.   HYDROcodone-acetaminophen (NORCO/VICODIN) 5-325 MG tablet Take 1 tablet by mouth every 4 (four) hours as needed for severe pain.   Multiple Vitamins-Minerals (OCUVITE PO) Take by mouth daily.   Omega-3 Fatty Acids (OMEGA-3 FISH OIL PO) Take 1,400 mg by mouth at bedtime.   Polyethyl Glyc-Propyl Glyc PF 0.4-0.3 % SOLN Place 1-2 drops into both eyes 3 (three) times daily as needed (for dry/irritated eyes.).   Potassium 99 MG TABS Take 99 mg by mouth daily.   senna (SENOKOT) 8.6 MG tablet Take 5 tablets by mouth at bedtime.    simethicone (MYLICON) 80 MG chewable tablet Chew 80 mg by mouth every 6 (six) hours as needed for flatulence.   vitamin B-12 (CYANOCOBALAMIN) 1000 MCG tablet Take 1,000 mcg by mouth daily.   vitamin C (ASCORBIC ACID) 500 MG tablet Take 500 mg by mouth daily with supper.    Zinc 50 MG CAPS Take 1 capsule by mouth. OTC     Allergies:   Codeine and Nortriptyline   Social History   Tobacco Use   Smoking status: Former    Years: 30.00    Types: Cigarettes    Quit date: 11/16/2013    Years since quitting: 7.6   Smokeless  tobacco: Never  Vaping Use   Vaping Use: Never used  Substance Use Topics   Alcohol use: No   Drug use: Never     Family History: The patient's family history includes Cancer in her brother; Diabetes in her sister; Heart disease in her brother, father, and mother.  ROS:   Please see the history of present illness.   (+) tightness in chest (+) left arm pain (+)difficulty picking up her legs  (+) eye pain All other systems reviewed and are negative.  EKGs/Labs/Other Studies Reviewed:    The following studies were reviewed today:  EKG:  07/04/2021: NSR, Rate: 63 bpm , septal infarct ,ST and T wave  abnormalities inferolaterally  I have independently reviewed the images from n/a.  Recent Labs: 09/16/2020: ALT 23; BUN 9; Creatinine, Ser 0.63; Hemoglobin 10.0; Platelets 198; Potassium 3.5; Sodium 133  Recent Lipid Panel No results found for: CHOL, TRIG, HDL, CHOLHDL, VLDL, LDLCALC, LDLDIRECT  Physical Exam:    VS:  BP 124/62 (BP Location: Left Arm, Patient Position: Sitting, Cuff Size: Normal)   Pulse 63   Ht  (1.575 m)   Wt 114 lb (51.7 kg)   BMI 20.85 kg/m     Wt Readings from Last 5 Encounters:  07/04/21 114 lb (51.7 kg)  12/19/20 118 lb 1.6 oz (53.6 kg)  09/16/20 115 lb (52.2 kg)  08/31/19 121 lb 3.2 oz (55 kg)  08/26/18 122 lb (55.3 kg)    Constitutional: No acute distress Eyes: sclera non-icteric, normal conjunctiva and lids ENMT: normal dentition, moist mucous membranes Cardiovascular: regular rhythm, normal rate, no murmurs. S1 and S2 normal. Radial pulses normal bilaterally. No jugular venous distention.  Respiratory: clear to auscultation bilaterally, end respiratory wheezing  GI : normal bowel sounds, soft and nontender. No distention.   MSK: extremities warm, well perfused. No edema.  NEURO: grossly nonfocal exam, moves all extremities. PSYCH: alert and oriented x 3, normal mood and affect.   ASSESSMENT:    1. Precordial pain   2. Anemia,  unspecified type    PLAN:    Precordial pain - Plan: EKG 12-Lead, Basic metabolic panel, CT CORONARY MORPH W/CTA COR W/SCORE W/CA W/CM &/OR WO/CM  - I am quite concerned with her description of chest pain and history of smoking, as well as family history of CAD. We discussed options for evaluation, I think she would be an optimal candidate for CCTA. HR optimal, can receive further rate control at scanner.  -will follow up after CCTA for next steps and med titration if needed.  - continue ASA 81 mg daily  Anemia - mild anemia on review of PCP labs, appears macrocytic, MCV 101.  Total time of encounter: 45 minutes total time of encounter, including 30 minutes spent in face-to-face patient care on the date of this encounter. This time includes coordination of care and counseling regarding above mentioned problem list. Remainder of non-face-to-face time involved reviewing chart documents/testing relevant to the patient encounter and documentation in the medical record. I have independently reviewed documentation from referring provider.   Follow-up in 2-4 weeks after CCTA.  Weston Brass, MD, [facility]   Shared Decision Making/Informed Consent:       Medication Adjustments/Labs and Tests Ordered: Current medicines are reviewed at length with the patient today.  Concerns regarding medicines are outlined above.   Orders Placed This Encounter  Procedures   CT CORONARY MORPH W/CTA COR W/SCORE W/CA W/CM &/OR WO/CM   Basic metabolic panel   EKG 12-Lead    No orders of the defined types were placed in this encounter.   Patient Instructions  Medication Instructions:  No Changes In Medications at this time.  *If you need a refill on your cardiac medications before your next appointment, please call your pharmacy*  Lab Work: BMET- ONE WEEK PRIOR TO CCTA SCAN- PLEASE RETURN FOR THIS  If you have labs (blood work) drawn today and your tests are completely normal,  you will receive your results only by: MyChart Message (if you have MyChart) OR A paper copy in the mail If you have any lab test that is abnormal or we need to change your  treatment, we will call you to review the results.  Testing/Procedures: Your physician has requested that you have cardiac CT. Cardiac computed tomography (CT) is a painless test that uses an x-ray machine to take clear, detailed pictures of your heart. For further information please visit [email or web address] Please follow instruction sheet as given.  Follow-Up: At [facility], you and your health needs are our priority.  As part of our continuing mission to provide you with exceptional heart care, we have created designated Provider Care Teams.  These Care Teams include your primary Cardiologist (physician) and Advanced Practice Providers (APPs -  Physician Assistants and Nurse Practitioners) who all work together to provide you with the care you need, when you need it.  We recommend signing up for the patient portal called "MyChart".  Sign up information is provided on this After Visit Summary.  MyChart is used to connect with patients for Virtual Visits (Telemedicine).  Patients are able to view lab/test results, encounter notes, upcoming appointments, etc.  Non-urgent messages can be sent to your provider as well.   To learn more about what you can do with MyChart, go to [email or web address].    Your next appointment:   3-4 week(s)  The format for your next appointment:   In Person  Provider:   Weston Brass, MD  Other Instructions   Your cardiac CT will be scheduled at one of the below locations:   [facility] [address] [place], [place] [address] [phone]  If scheduled at [facility], please arrive at the [facility] main entrance (entrance A) of [facility] 30 minutes prior to test start time. Proceed to the [facility] Radiology Department (first floor) to  check-in and test prep.  Please follow these instructions carefully (unless otherwise directed):  On the Night Before the Test: Be sure to Drink plenty of water. Do not consume any caffeinated/decaffeinated beverages or chocolate 12 hours prior to your test. Do not take any antihistamines 12 hours prior to your test.  On the Day of the Test: Drink plenty of water until 1 hour prior to the test. Do not eat any food 4 hours prior to the test. You may take your regular medications prior to the test.  HOLD Furosemide/Hydrochlorothiazide morning of the test. FEMALES- please wear underwire-free bra if available, avoid dresses & tight clothing  After the Test: Drink plenty of water. After receiving IV contrast, you may experience a mild flushed feeling. This is normal. On occasion, you may experience a mild rash up to 24 hours after the test. This is not dangerous. If this occurs, you can take Benadryl 25 mg and increase your fluid intake. If you experience trouble breathing, this can be serious. If it is severe call 911 IMMEDIATELY. If it is mild, please call our office. If you take any of these medications: Glipizide/Metformin, Avandament, Glucavance, please do not take 48 hours after completing test unless otherwise instructed.  Please allow 2-4 weeks for scheduling of routine cardiac CTs. Some insurance companies require a pre-authorization which may delay scheduling of this test.   For non-scheduling related questions, please contact the cardiac imaging nurse navigator should you have any questions/concerns: Rockwell Alexandria, Cardiac Imaging Nurse Navigator Larey Brick, Cardiac Imaging Nurse Navigator [facility] Direct Office Dial: [phone]   For scheduling needs, including cancellations and rescheduling, please call [place], [phone].    I,Zite Okoli,acting as a Neurosurgeon for Parke Poisson, MD.,have documented all relevant documentation  on the  behalf of Parke Poisson, MD,as directed by  Parke Poisson, MD while in the presence of Parke Poisson, MD.   I, Parke Poisson, MD, have reviewed all documentation for this visit. The documentation on today's date of service for the exam, diagnosis, procedures, and orders are all accurate and complete.

## 2021-07-04 NOTE — Patient Instructions (Addendum)
Medication Instructions:  No Changes In Medications at this time.  *If you need a refill on your cardiac medications before your next appointment, please call your pharmacy*  Lab Work: BMET- ONE WEEK PRIOR TO CCTA SCAN- PLEASE RETURN FOR THIS  If you have labs (blood work) drawn today and your tests are completely normal, you will receive your results only by: MyChart Message (if you have MyChart) OR A paper copy in the mail If you have any lab test that is abnormal or we need to change your treatment, we will call you to review the results.  Testing/Procedures: Your physician has requested that you have cardiac CT. Cardiac computed tomography (CT) is a painless test that uses an x-ray machine to take clear, detailed pictures of your heart. For further information please visit [email or web address] Please follow instruction sheet as given.  Follow-Up: At [facility], you and your health needs are our priority.  As part of our continuing mission to provide you with exceptional heart care, we have created designated Provider Care Teams.  These Care Teams include your primary Cardiologist (physician) and Advanced Practice Providers (APPs -  Physician Assistants and Nurse Practitioners) who all work together to provide you with the care you need, when you need it.  We recommend signing up for the patient portal called "MyChart".  Sign up information is provided on this After Visit Summary.  MyChart is used to connect with patients for Virtual Visits (Telemedicine).  Patients are able to view lab/test results, encounter notes, upcoming appointments, etc.  Non-urgent messages can be sent to your provider as well.   To learn more about what you can do with MyChart, go to [email or web address].    Your next appointment:   3-4 week(s)  The format for your next appointment:   In Person  Provider:   Weston Brass, MD  Other Instructions   Your cardiac CT will be scheduled at one of the  below locations:   [facility] [address] [place], [place] [address] [phone]  If scheduled at [facility], please arrive at the [facility] main entrance (entrance A) of [facility] 30 minutes prior to test start time. Proceed to the [facility] Radiology Department (first floor) to check-in and test prep.  Please follow these instructions carefully (unless otherwise directed):  On the Night Before the Test: Be sure to Drink plenty of water. Do not consume any caffeinated/decaffeinated beverages or chocolate 12 hours prior to your test. Do not take any antihistamines 12 hours prior to your test.  On the Day of the Test: Drink plenty of water until 1 hour prior to the test. Do not eat any food 4 hours prior to the test. You may take your regular medications prior to the test.  HOLD Furosemide/Hydrochlorothiazide morning of the test. FEMALES- please wear underwire-free bra if available, avoid dresses & tight clothing  After the Test: Drink plenty of water. After receiving IV contrast, you may experience a mild flushed feeling. This is normal. On occasion, you may experience a mild rash up to 24 hours after the test. This is not dangerous. If this occurs, you can take Benadryl 25 mg and increase your fluid intake. If you experience trouble breathing, this can be serious. If it is severe call 911 IMMEDIATELY. If it is mild, please call our office. If you take any of these medications: Glipizide/Metformin, Avandament, Glucavance, please do not take 48 hours after completing test unless otherwise instructed.  Please  allow 2-4 weeks for scheduling of routine cardiac CTs. Some insurance companies require a pre-authorization which may delay scheduling of this test.   For non-scheduling related questions, please contact the cardiac imaging nurse navigator should you have any questions/concerns: Rockwell Alexandria, Cardiac Imaging Nurse Navigator Larey Brick, Cardiac Imaging Nurse Navigator [facility] Direct Office Dial: [phone]   For scheduling needs, including cancellations and rescheduling, please call [place], [phone].

## 2021-07-05 ENCOUNTER — Telehealth (HOSPITAL_COMMUNITY): Payer: Self-pay | Admitting: Emergency Medicine

## 2021-07-05 NOTE — Telephone Encounter (Signed)
Reaching out to patient to offer assistance regarding upcoming cardiac imaging study; pt verbalizes understanding of appt date/time, parking situation and where to check in, pre-test NPO status and medications ordered, and verified current allergies; name and call back number provided for further questions should they arise Rockwell Alexandria RN Navigator Cardiac Imaging [facility] [phone] office [phone] cell  Pt states shes getting labs on Monday 8/1 Denies claustro Denies iv issues Daily meds per usual (hr 63)

## 2021-07-08 DIAGNOSIS — R072 Precordial pain: Secondary | ICD-10-CM | POA: Diagnosis not present

## 2021-07-09 LAB — BASIC METABOLIC PANEL
BUN/Creatinine Ratio: 17 (ref 12–28)
BUN: 11 mg/dL (ref 8–27)
CO2: 28 mmol/L (ref 20–29)
Calcium: 9.5 mg/dL (ref 8.7–10.3)
Chloride: 103 mmol/L (ref 96–106)
Creatinine, Ser: 0.64 mg/dL (ref 0.57–1.00)
Glucose: 72 mg/dL (ref 65–99)
Potassium: 5.1 mmol/L (ref 3.5–5.2)
Sodium: 142 mmol/L (ref 134–144)
eGFR: 92 mL/min/{1.73_m2} (ref >59)

## 2021-07-10 ENCOUNTER — Ambulatory Visit (HOSPITAL_COMMUNITY): Admission: RE | Admit: 2021-07-10 | Payer: Medicare Other | Source: Ambulatory Visit

## 2021-07-11 DIAGNOSIS — H00024 Hordeolum internum left upper eyelid: Secondary | ICD-10-CM | POA: Diagnosis not present

## 2021-07-16 DIAGNOSIS — H18422 Band keratopathy, left eye: Secondary | ICD-10-CM | POA: Diagnosis not present

## 2021-07-18 ENCOUNTER — Telehealth: Payer: Self-pay

## 2021-07-18 NOTE — Telephone Encounter (Signed)
Attempted to contact patient to see if she needs any assistance getting Coronary CTA rescheduled due to cancelling previous appointment. Left message for patient to return call to office when able to discuss.

## 2021-07-26 DIAGNOSIS — H18832 Recurrent erosion of cornea, left eye: Secondary | ICD-10-CM | POA: Diagnosis not present

## 2021-08-07 ENCOUNTER — Ambulatory Visit: Payer: Medicare Other | Admitting: Internal Medicine

## 2021-08-07 DIAGNOSIS — H18422 Band keratopathy, left eye: Secondary | ICD-10-CM | POA: Diagnosis not present

## 2021-08-07 DIAGNOSIS — N390 Urinary tract infection, site not specified: Secondary | ICD-10-CM | POA: Diagnosis not present

## 2021-08-07 DIAGNOSIS — R208 Other disturbances of skin sensation: Secondary | ICD-10-CM | POA: Diagnosis not present

## 2021-08-26 ENCOUNTER — Other Ambulatory Visit: Payer: Self-pay | Admitting: Family Medicine

## 2021-08-26 DIAGNOSIS — Z1231 Encounter for screening mammogram for malignant neoplasm of breast: Secondary | ICD-10-CM

## 2021-10-11 ENCOUNTER — Ambulatory Visit
Admission: RE | Admit: 2021-10-11 | Discharge: 2021-10-11 | Disposition: A | Discharge status: Home / Self Care | Payer: Medicare Other | Source: Ambulatory Visit | Attending: Family Medicine | Admitting: Family Medicine

## 2021-10-11 ENCOUNTER — Other Ambulatory Visit: Payer: Self-pay

## 2021-10-11 DIAGNOSIS — Z1231 Encounter for screening mammogram for malignant neoplasm of breast: Secondary | ICD-10-CM | POA: Diagnosis not present

## 2021-10-16 DIAGNOSIS — M810 Age-related osteoporosis without current pathological fracture: Secondary | ICD-10-CM | POA: Diagnosis not present

## 2021-10-16 DIAGNOSIS — Z79899 Other long term (current) drug therapy: Secondary | ICD-10-CM | POA: Diagnosis not present

## 2021-10-16 DIAGNOSIS — Z23 Encounter for immunization: Secondary | ICD-10-CM | POA: Diagnosis not present

## 2021-10-16 DIAGNOSIS — Z0000 Encounter for general adult medical examination without abnormal findings: Secondary | ICD-10-CM | POA: Diagnosis not present

## 2021-10-16 DIAGNOSIS — D72819 Decreased white blood cell count, unspecified: Secondary | ICD-10-CM | POA: Diagnosis not present

## 2021-10-16 DIAGNOSIS — D539 Nutritional anemia, unspecified: Secondary | ICD-10-CM | POA: Diagnosis not present

## 2021-10-16 DIAGNOSIS — M4802 Spinal stenosis, cervical region: Secondary | ICD-10-CM | POA: Diagnosis not present

## 2021-11-19 ENCOUNTER — Ambulatory Visit: Payer: Self-pay

## 2021-11-19 ENCOUNTER — Ambulatory Visit: Payer: Medicare Other | Admitting: Orthopaedic Surgery

## 2021-11-19 ENCOUNTER — Encounter: Payer: Self-pay | Admitting: Orthopaedic Surgery

## 2021-11-19 ENCOUNTER — Other Ambulatory Visit: Payer: Self-pay

## 2021-11-19 DIAGNOSIS — G8929 Other chronic pain: Secondary | ICD-10-CM

## 2021-11-19 DIAGNOSIS — M25562 Pain in left knee: Secondary | ICD-10-CM

## 2021-11-19 MED ORDER — LIDOCAINE HCL 1 % IJ SOLN
2.0000 mL | INTRAMUSCULAR | Status: AC | PRN
  Administered 2021-11-19: 2 mL

## 2021-11-19 MED ORDER — BUPIVACAINE HCL 0.5 % IJ SOLN
2.0000 mL | INTRAMUSCULAR | Status: AC | PRN
  Administered 2021-11-19: 2 mL via INTRA_ARTICULAR

## 2021-11-19 MED ORDER — METHYLPREDNISOLONE ACETATE 40 MG/ML IJ SUSP
40.0000 mg | INTRAMUSCULAR | Status: AC | PRN
  Administered 2021-11-19: 40 mg via INTRA_ARTICULAR

## 2021-11-19 MED ORDER — CELECOXIB 200 MG PO CAPS: 200.0000 mg | ORAL_CAPSULE | Freq: Two times a day (BID) | ORAL | 3 refills | Status: DC

## 2021-11-19 NOTE — Progress Notes (Signed)
Office Visit Note   Patient: Amanda West           Date of Birth: 05-Nov-1946           MRN: [id number] Visit Date: 11/19/2021              Requested by: Lupita Raider, MD [address] [place],  [place] [address] PCP: Lupita Raider, MD   Assessment & Plan: Visit Diagnoses:  1. Chronic pain of left knee     Plan: Impression is chronic left knee pain.  Cortisone injection was performed today.  She will take it easy for the next 6 weeks and then increase activity as tolerated.  She should follow-up with [place] if she does not feel any relief or if the relief is short-lived.  Follow-Up Instructions: No follow-ups on file.   Orders:  Orders Placed This Encounter  Procedures   XR KNEE 3 VIEW LEFT   Meds ordered this encounter  Medications   celecoxib (CELEBREX) 200 MG capsule    Sig: Take 1 capsule (200 mg total) by mouth 2 (two) times daily.    Dispense:  30 capsule    Refill:  3      Procedures: Large Joint Inj: L knee on 11/19/2021 11:45 AM Details: 22 G needle Medications: 2 mL bupivacaine 0.5 %; 2 mL lidocaine 1 %; 40 mg methylPREDNISolone acetate 40 MG/ML Outcome: tolerated well, no immediate complications Patient was prepped and draped in the usual sterile fashion.      Clinical Data: No additional findings.   Subjective: Chief Complaint  Patient presents with   Left Knee - Pain    Amanda West is a very pleasant and energetic 75 year old female comes in for 2 months of left knee pain.  She feels like she had an injury about 2 months when she missed a step while stepping out of her house and felt a pop.  Since then she has felt some difficulty flexing and pain anteriorly and feels like there is swelling.  She has been using ice for this.  She is not taking any medications on a regular basis.   Review of Systems  Constitutional: Negative.   HENT: Negative.    Eyes: Negative.   Respiratory: Negative.    Cardiovascular: Negative.   Endocrine:  Negative.   Musculoskeletal: Negative.   Neurological: Negative.   Hematological: Negative.   Psychiatric/Behavioral: Negative.    All other systems reviewed and are negative.   Objective: Vital Signs: There were no vitals taken for this visit.  Physical Exam Vitals and nursing note reviewed.  Constitutional:      Appearance: She is well-developed.  Pulmonary:     Effort: Pulmonary effort is normal.  Skin:    General: Skin is warm.     Capillary Refill: Capillary refill takes less than 2 seconds.  Neurological:     Mental Status: She is alert and oriented to person, place, and time.  Psychiatric:        Behavior: Behavior normal.        Thought Content: Thought content normal.        Judgment: Judgment normal.    Ortho Exam  Left knee shows trace effusion.  Collaterals and cruciates are stable.  Range of motion is mildly limited secondary to tightness.  No joint line tenderness.  Specialty Comments:  No specialty comments available.  Imaging: XR KNEE 3 VIEW LEFT  Result Date: 11/19/2021 Mild osteoarthritis.  Generalized osteopenia.  PMFS History: Patient Active Problem List   Diagnosis Date Noted   Cervical spinal stenosis 09/21/2017   Spinal stenosis in cervical region 09/15/2014   Abnormality of gait 07/22/2013   Dislocated IOL (intraocular lens), posterior 06/24/2012   Past Medical History:  Diagnosis Date   Abnormality of gait    previous was seen by neurology-- dr c. Anne Hahn--- relased lov 08-31-2019, much improved per pt   Chronic constipation    Cornea scar    right eye   Eyes sensitive to light, bilateral    per pt right > left due to residual shingles    IDA (iron deficiency anemia)    Osteoporosis    Spinal stenosis in cervical region 09/15/2014   Subcutaneous mass    bilateral abdominal    Vitamin D deficiency    Wears glasses     Family History  Problem Relation Age of Onset   Heart disease Mother    Heart disease Father    Diabetes  Sister    Cancer Brother    Heart disease Brother     Past Surgical History:  Procedure Laterality Date   ABDOMINAL HERNIA REPAIR  1980s   ANTERIOR CERVICAL DECOMP/DISCECTOMY FUSION N/A 09/21/2017   Procedure: Anterior Cervical Decompression Fusion Cervical Four-Five,Cervical Five-Six,Cervical Six-Seven;  Surgeon: Julio Sicks, MD;  Location: [facility];  Service: Neurosurgery;  Laterality: N/A;  anterior    CATARACT EXTRACTION W/ INTRAOCULAR LENS  IMPLANT, BILATERAL  1990's   MASS EXCISION N/A 12/19/2020   Procedure: EXCISION OF RIGHT LOWER ABDOMEN SUBCUTANEOUS MASS, EXCISION OF LEFT LOWER ABDOMEN SUBCUTANEOUS MASS;  Surgeon: Quentin Ore, MD;  Location: [facility];  Service: General;  Laterality: N/A;   PARS PLANA VITRECTOMY  07/06/2012   Procedure: PARS PLANA VITRECTOMY WITH 25G REMOVAL/SUTURE INTRAOCULAR LENS;  Surgeon: Sherrie George, MD;  Location: [facility] OR;  Service: Ophthalmology;  Laterality: Left;   VITRECTOMY AND CATARACT Right ?   IOL replaced   Social History   Occupational History   Occupation: CARD Engineer, petroleum: [employer]  Tobacco Use   Smoking status: Former    Years: 30.00    Types: Cigarettes    Quit date: 11/16/2013    Years since quitting: 8.0   Smokeless tobacco: Never  Vaping Use   Vaping Use: Never used  Substance and Sexual Activity   Alcohol use: No   Drug use: Never   Sexual activity: Not on file

## 2021-12-13 DIAGNOSIS — H18422 Band keratopathy, left eye: Secondary | ICD-10-CM | POA: Diagnosis not present

## 2022-02-24 DIAGNOSIS — L570 Actinic keratosis: Secondary | ICD-10-CM | POA: Diagnosis not present

## 2022-02-24 DIAGNOSIS — M542 Cervicalgia: Secondary | ICD-10-CM | POA: Diagnosis not present

## 2022-04-02 ENCOUNTER — Ambulatory Visit: Payer: Medicare Other | Admitting: Orthopaedic Surgery

## 2022-04-02 DIAGNOSIS — M4802 Spinal stenosis, cervical region: Secondary | ICD-10-CM | POA: Diagnosis not present

## 2022-04-03 DIAGNOSIS — M542 Cervicalgia: Secondary | ICD-10-CM | POA: Diagnosis not present

## 2022-04-03 DIAGNOSIS — M4802 Spinal stenosis, cervical region: Secondary | ICD-10-CM | POA: Diagnosis not present

## 2022-04-10 DIAGNOSIS — M4802 Spinal stenosis, cervical region: Secondary | ICD-10-CM | POA: Diagnosis not present

## 2022-04-11 ENCOUNTER — Other Ambulatory Visit: Payer: Self-pay | Admitting: Neurosurgery

## 2022-04-22 NOTE — Pre-Procedure Instructions (Signed)
Surgical Instructions ? ? ? Your procedure is scheduled on Friday 05/02/22. ? ? Report to [facility] Main Entrance "A" at 08:15 A.M., then check in with the Admitting office. ? Call this number if you have problems the morning of surgery: ? [phone] ? ? If you have any questions prior to your surgery date call [phone]: Open Monday-Friday 8am-4pm ? ? ? Remember: ? Do not eat or drink after midnight the night before your surgery ?  ? Take these medicines the morning of surgery with A SIP OF WATER:  ? NONE ? ? ? Take these medicines if needed:  ? Polyethyl Glyc-Propyl Glyc  ? ? ?As of today, STOP taking any Aspirin (unless otherwise instructed by your surgeon) Aleve, Naproxen, Ibuprofen, Motrin, Advil, Goody's, BC's, all herbal medications, fish oil, and all vitamins. ? ?         ?Do not wear jewelry or makeup ?Do not wear lotions, powders, perfumes/colognes, or deodorant. ?Do not shave 48 hours prior to surgery.  Men may shave face and neck. ?Do not bring valuables to the hospital. ?Do not wear nail polish, gel polish, artificial nails, or any other type of covering on natural nails (fingers and toes) ?If you have artificial nails or gel coating that need to be removed by a nail salon, please have this removed prior to surgery. Artificial nails or gel coating may interfere with anesthesia's ability to adequately monitor your vital signs. ? ?[facility] is not responsible for any belongings or valuables. .  ? ?Do NOT Smoke (Tobacco/Vaping)  24 hours prior to your procedure ? ?If you use a CPAP at night, you may bring your mask for your overnight stay. ?  ?Contacts, glasses, hearing aids, dentures or partials may not be worn into surgery, please bring cases for these belongings ?  ?For patients admitted to the hospital, discharge time will be determined by your treatment team. ?  ?Patients discharged the day of surgery will not be allowed to drive home, and someone needs to stay with them for 24  hours. ? ? ?SURGICAL WAITING ROOM VISITATION ?Patients having surgery or a procedure in a hospital may have two support people. ?Children under the age of 57 must have an adult with them who is not the patient. ?They may stay in the waiting area during the procedure and may switch out with other visitors. If the patient needs to stay at the hospital during part of their recovery, the visitor guidelines for inpatient rooms apply. ? ?Please refer to the [facility] website for the visitor guidelines for Inpatients (after your surgery is over and you are in a regular room).  ? ? ? ? ? ?Special instructions:   ? ?Oral Hygiene is also important to reduce your risk of infection.  Remember - BRUSH YOUR TEETH THE MORNING OF SURGERY WITH YOUR REGULAR TOOTHPASTE ? ? ?[facility]- Preparing For Surgery ? ?Before surgery, you can play an important role. Because skin is not sterile, your skin needs to be as free of germs as possible. You can reduce the number of germs on your skin by washing with CHG (chlorahexidine gluconate) Soap before surgery.  CHG is an antiseptic cleaner which kills germs and bonds with the skin to continue killing germs even after washing.   ? ? ?Please do not use if you have an allergy to CHG or antibacterial soaps. If your skin becomes reddened/irritated stop using the CHG.  ?Do not shave (including legs and underarms) for at least  48 hours prior to first CHG shower. It is OK to shave your face. ? ?Please follow these instructions carefully. ?  ? ? Shower the NIGHT BEFORE SURGERY and the MORNING OF SURGERY with CHG Soap.  ? If you chose to wash your hair, wash your hair first as usual with your normal shampoo. After you shampoo, rinse your hair and body thoroughly to remove the shampoo.  Then [employer] and genitals (private parts) with your normal soap and rinse thoroughly to remove soap. ? ?After that Use CHG Soap as you would any other liquid soap. You can apply CHG directly to the skin and wash gently  with a scrungie or a clean washcloth.  ? ?Apply the CHG Soap to your body ONLY FROM THE NECK DOWN.  Do not use on open wounds or open sores. Avoid contact with your eyes, ears, mouth and genitals (private parts). Wash Face and genitals (private parts)  with your normal soap.  ? ?Wash thoroughly, paying special attention to the area where your surgery will be performed. ? ?Thoroughly rinse your body with warm water from the neck down. ? ?DO NOT shower/wash with your normal soap after using and rinsing off the CHG Soap. ? ?Pat yourself dry with a CLEAN TOWEL. ? ?Wear CLEAN PAJAMAS to bed the night before surgery ? ?Place CLEAN SHEETS on your bed the night before your surgery ? ?DO NOT SLEEP WITH PETS. ? ? ?Day of Surgery: ? ?Take a shower with CHG soap. ?Wear Clean/Comfortable clothing the morning of surgery ?Do not apply any deodorants/lotions.   ?Remember to brush your teeth WITH YOUR REGULAR TOOTHPASTE. ? ? ? ?If you received a COVID test during your pre-op visit, it is requested that you wear a mask when out in public, stay away from anyone that may not be feeling well, and notify your surgeon if you develop symptoms. If you have been in contact with anyone that has tested positive in the last 10 days, please notify your surgeon. ? ?  ?Please read over the following fact sheets that you were given.  ? ?

## 2022-04-23 ENCOUNTER — Other Ambulatory Visit: Payer: Self-pay

## 2022-04-23 ENCOUNTER — Encounter (HOSPITAL_COMMUNITY)
Admission: RE | Admit: 2022-04-23 | Discharge: 2022-04-23 | Disposition: A | Discharge status: Home / Self Care | Payer: Medicare Other | Source: Ambulatory Visit | Attending: Neurosurgery | Admitting: Neurosurgery

## 2022-04-23 ENCOUNTER — Encounter (HOSPITAL_COMMUNITY): Payer: Self-pay

## 2022-04-23 VITALS — BP 127/75 | HR 65 | Temp 97.3°F | Resp 17 | Ht 62.0 in | Wt 115.3 lb

## 2022-04-23 DIAGNOSIS — Z01818 Encounter for other preprocedural examination: Secondary | ICD-10-CM

## 2022-04-23 DIAGNOSIS — Z87891 Personal history of nicotine dependence: Secondary | ICD-10-CM | POA: Diagnosis not present

## 2022-04-23 DIAGNOSIS — M4800 Spinal stenosis, site unspecified: Secondary | ICD-10-CM | POA: Insufficient documentation

## 2022-04-23 DIAGNOSIS — Z01812 Encounter for preprocedural laboratory examination: Secondary | ICD-10-CM | POA: Insufficient documentation

## 2022-04-23 HISTORY — DX: Myoneural disorder, unspecified: G70.9

## 2022-04-23 HISTORY — DX: Unspecified osteoarthritis, unspecified site: M19.90

## 2022-04-23 LAB — CBC
HCT: 35.1 % — ABNORMAL LOW (ref 36.0–46.0)
Hemoglobin: 11.6 g/dL — ABNORMAL LOW (ref 12.0–15.0)
MCH: 33.6 pg (ref 26.0–34.0)
MCHC: 33 g/dL (ref 30.0–36.0)
MCV: 101.7 fL — ABNORMAL HIGH (ref 80.0–100.0)
Platelets: 246 10*3/uL (ref 150–400)
RBC: 3.45 MIL/uL — ABNORMAL LOW (ref 3.87–5.11)
RDW: 12.9 % (ref 11.5–15.5)
WBC: 4.2 10*3/uL (ref 4.0–10.5)
nRBC: 0 % (ref 0.0–0.2)

## 2022-04-23 LAB — SURGICAL PCR SCREEN
MRSA, PCR: NEGATIVE
Staphylococcus aureus: NEGATIVE

## 2022-04-23 NOTE — Progress Notes (Addendum)
PCP - Dr. Serita Grammes ?Cardiologist - denies ? ?PPM/ICD - n/a ? ?Chest x-ray - n/a ?EKG - 07/04/21 ?Stress Test - Over 15 years ago per patient. Normal per patient ?ECHO - 12/30/12 ?Cardiac Cath - denies ? ?Sleep Study - denies ?CPAP - n/a ? ?Patient denies having diabetes mellitus ? ?Blood Thinner Instructions: n/a ?Aspirin Instructions: As of today stop taking Aspirin unless otherwise instructed by your surgeon. Patient states she has already stopped taking Aspirin per her surgeon's instructions.  ? ?ERAS Protcol - No. NPO ? ?COVID TEST- N/A ? ? ?Anesthesia review: Yes. EKG Review ? ?Patient denies shortness of breath, fever, cough and chest pain at PAT appointment ? ? ?All instructions explained to the patient, with a verbal understanding of the material. Patient agrees to go over the instructions while at home for a better understanding. The opportunity to ask questions was provided. ? ? ?

## 2022-04-24 ENCOUNTER — Encounter (HOSPITAL_COMMUNITY): Payer: Self-pay | Admitting: Vascular Surgery

## 2022-04-24 NOTE — Progress Notes (Signed)
Anesthesia Chart Review:  Case: [id number] Date/Time: 05/02/22 0959   Procedure: ACDF - C3-C4 - 3C   Anesthesia type: General   Pre-op diagnosis: Stenosis   [facility]   Surgeons: Julio Sicks, MD       DISCUSSION: Patient is a 76 year old female scheduled for the above procedure.   History includes former smoker (quit 11/16/13), iron deficiency anemia, neuropathy, spinal surgery (C4-7 ACDF 09/21/17), abdominal mass (s/p excision of bilateral lower abdominal masses, pathology: calcified nodules with palisaded histiocytes 12/19/20), eye surgery (cataracts extraction 1990s, pars plana vitrectomy 2012, 2013).   She had a cardiology evaluation by Dr. Jacques Navy on 07/04/21 for chest pain with family history of CAD and history of smoking. EKG showed inferolateral T wave inversions. She recommended a CCTA for further evaluation, but patient did not schedule. Follow-up after CCTA planned.   Patient will need preoperative cardiology input given she did not follow-up through with cardiology testing or follow-up. Erie Noe at Dr. Lindalou Hose office notified, and she will reach out to [facility].   VS: BP 127/75   Pulse 65   Temp (!) 36.3 C (Oral)   Resp 17   Ht 5\' 2"  (1.575 m)   Wt 52.3 kg   SpO2 100%   BMI 21.09 kg/m    PROVIDERS: , MD is PCP  Lupita Raider, MD is cardiologist   LABS: Labs reviewed: Acceptable for surgery. Normal CMP 10/16/21 [facility] Everywhere). (all labs ordered are listed, but only abnormal results are displayed)  Labs Reviewed  CBC - Abnormal; Notable for the following components:      Result Value   RBC 3.45 (*)    Hemoglobin 11.6 (*)    HCT 35.1 (*)    MCV 101.7 (*)    All other components within normal limits  SURGICAL PCR SCREEN    IMAGES: MRI C-spine 04/03/22 ([facility]): IMPRESSION: 1. ACDF C4 through C7 without stenosis 2. Central disc protrusion at C3-4. Bilateral facet hypertrophy. Moderate to severe  spinal stenosis with cord compression right G reater than left. Cord signal normal.   EKG: 07/04/21 ([facility]): NSR. Septal infarct (age undetermined). ST/T wave abnormality, consider inferolateral ischemia.     CV: She had a "unremarkable" prolonged cardiac monitor in 2014.  She reported a normal stress test > 15 years ago.  Past Medical History:  Diagnosis Date   Abnormality of gait    previous was seen by neurology-- dr c. 2015--- relased lov 08-31-2019, much improved per pt   Arthritis    Chronic constipation    Cornea scar    right eye   Eyes sensitive to light, bilateral    per pt right > left due to residual shingles    IDA (iron deficiency anemia)    Neuromuscular disorder (HCC)    Neuropathy   Osteoporosis    Spinal stenosis in cervical region 09/15/2014   Subcutaneous mass    bilateral abdominal    Vitamin D deficiency    Wears glasses     Past Surgical History:  Procedure Laterality Date   ABDOMINAL HERNIA REPAIR  1980s   ANTERIOR CERVICAL DECOMP/DISCECTOMY FUSION N/A 09/21/2017   Procedure: Anterior Cervical Decompression Fusion Cervical Four-Five,Cervical Five-Six,Cervical Six-Seven;  Surgeon: 09/23/2017, MD;  Location: [facility] OR;  Service: Neurosurgery;  Laterality: N/A;  anterior    CATARACT EXTRACTION W/ INTRAOCULAR LENS  IMPLANT, BILATERAL  1990's   MASS EXCISION N/A 12/19/2020   Procedure: EXCISION OF RIGHT LOWER ABDOMEN  SUBCUTANEOUS MASS, EXCISION OF LEFT LOWER ABDOMEN SUBCUTANEOUS MASS;  Surgeon: Quentin Ore, MD;  Location: [facility];  Service: General;  Laterality: N/A;   PARS PLANA VITRECTOMY  07/06/2012   Procedure: PARS PLANA VITRECTOMY WITH 25G REMOVAL/SUTURE INTRAOCULAR LENS;  Surgeon: Sherrie George, MD;  Location: [facility] OR;  Service: Ophthalmology;  Laterality: Left;   TUBAL LIGATION     VITRECTOMY AND CATARACT Right ?   IOL replaced    MEDICATIONS:  APPLE CIDER VINEGAR PO   aspirin EC 81 MG tablet    Calcium-Magnesium-Zinc (CAL-MAG-ZINC PO)   Capsicum, Cayenne, (CAYENNE PEPPER PO)   celecoxib (CELEBREX) 200 MG capsule   Cholecalciferol (VITAMIN D-3) 125 MCG (5000 UT) TABS   Cinnamon 500 MG capsule   COLLAGEN PO   DULoxetine (CYMBALTA) 20 MG capsule   GREEN TEA, CAMELLIA SINENSIS, PO   Pediatric Multivitamins-Iron (FLINTSTONES PLUS IRON PO)   Polyethyl Glyc-Propyl Glyc PF 0.4-0.3 % SOLN   Potassium 99 MG TABS   senna (SENOKOT) 8.6 MG tablet   simethicone (MYLICON) 80 MG chewable tablet   TURMERIC PO   vitamin B-12 (CYANOCOBALAMIN) 1000 MCG tablet   vitamin C (ASCORBIC ACID) 500 MG tablet   zinc sulfate 220 (50 Zn) MG capsule   No current facility-administered medications for this encounter.  ASA on hold for surgery.   Shonna Chock, PA-C Surgical Short Stay/Anesthesiology [facility] Phone [phone] [facility] Building Phone [phone] 04/24/2022 3:41 PM

## 2022-04-25 DIAGNOSIS — M4802 Spinal stenosis, cervical region: Secondary | ICD-10-CM | POA: Diagnosis not present

## 2022-04-29 ENCOUNTER — Telehealth: Payer: Self-pay | Admitting: Internal Medicine

## 2022-04-29 NOTE — Telephone Encounter (Signed)
Primary Cardiologist:None  Chart reviewed as part of pre-operative protocol coverage. Because of Maryela Tapper Totman's past medical history and time since last visit, he/she will require a virtual visit/telephone call in order to better assess preoperative cardiovascular risk.  Pre-op covering staff: - Please contact patient, obtain consent, and schedule appointment   If applicable, this message will also be routed to pharmacy pool and/or primary cardiologist for input on holding anticoagulant/antiplatelet agent as requested below so that this information is available at time of patient's appointment.   Levi Aland, NP-C    04/29/2022, 4:52 PM [facility] [address], [address] Office [phone] Fax [phone]

## 2022-04-29 NOTE — Telephone Encounter (Signed)
   Pre-operative Risk Assessment    Patient Name: Amanda West  DOB: [birth year] MRN: [id number]      Request for Surgical Clearance    Procedure:   Anterior Cervical Spin Fusion   Date of Surgery:  Clearance 05/02/22                                 Surgeon:  Dr. Julio Sicks Surgeon's Group or Practice Name:  [facility]  Phone number:  [phone] Fax number:  [phone]   Type of Clearance Requested:   - Medical  - Pharmacy:  Hold Aspirin 1 week prior, pt is currently holding.   Type of Anesthesia:  General    Additional requests/questions:    [name]   04/29/2022, 3:34 PM

## 2022-04-29 NOTE — Telephone Encounter (Signed)
1st attempt I have attempted to contact this patient by phone, but no vm.  I will continue to try later.

## 2022-04-30 NOTE — Telephone Encounter (Signed)
I called the pt and informed her that she is going to need an IN OFFICE appt per the pre op provider, see notes.  Pt is very upset. Pt tells me that she did not follow up with Dr. Margaretann Loveless or have the coronary CT because She states that Dr. Annette Stable told her the symptoms she was having (numbness, tingling in her arms and neck) are symptoms of the neck and back problems that she is having. I explained to the pt that these can also be symptoms of cardiac issues as well, which is why Dr. Alveda Reasons was ordering the Coronary CT. Pt tells me that she does not need a cardiologist and is not going to follow up our office. Pt tells me that she wants her surgery. I explained to the pt that the only thing I can do on my end it inform both Dr. Margaretann Loveless and Dr. Annette Stable of our conversation today. I will update the pre op provider as well. Pt thanked me for my call.

## 2022-04-30 NOTE — Telephone Encounter (Signed)
I just s/w the pt's husband who tells me the pt is at work right now and won't be home until after 5 pm. Our office just received clearance request yesterday 04/29/22 for surgery 05/02/22. Our office attempted to reach the pt yesterday though was unable too. Pt's husband said we can call her on her cell phone if we want, gave me her cell [phone]. I stated I will need to check with the pre o provider to see what he wants to do.   I have addressed with the pre op provider today Edd Fabian, FNP in regard to if he wants to reach out to the pt today as her surgery is Friday in 2 days.   Per pre op provider Edd Fabian, FNP:  pt was seen last summer and the cardiologist ordered a coronary CTA.  She never had it done.  She needs to be seen in the clinic before being given clearance.  I called the surgeon's office and left message for Erie Noe, surgery scheduler for Dr. Jordan Likes. The pt's procedure will need to be postponed until she has been seen in the office be the cardiologist. Please see notes above.  I will fax FYI to surgeon's office.

## 2022-05-01 NOTE — Telephone Encounter (Signed)
I s/w the pt today. I went over the notes per Dr. Jacques Navy. Pt is agreeable to in office appt. Pt has been scheduled to see Micah Flesher, [facility] 05/06/22 @ 2:45. Pt asked for the very first appt that we could give her. I have assured the pt that I will update all parties involved. Pt did thank me for the help and the call today.

## 2022-05-02 ENCOUNTER — Ambulatory Visit (HOSPITAL_COMMUNITY): Admission: RE | Admit: 2022-05-02 | Payer: Medicare Other | Source: Home / Self Care | Admitting: Neurosurgery

## 2022-05-02 SURGERY — ANTERIOR CERVICAL DECOMPRESSION/DISCECTOMY FUSION 1 LEVEL
Anesthesia: General

## 2022-05-05 NOTE — Progress Notes (Unsigned)
Cardiology Office Note:    Date:  05/06/2022   ID:  [name], DOB 18-Oct-1946, MRN [id number]  PCP:  Lupita Raider, MD   [facility] Providers Cardiologist:  Parke Poisson, MD Cardiology APP:  Marcelino Duster, [place] { Referring MD: Lupita Raider, MD   Chief Complaint  Patient presents with   Pre-op Exam    History of Present Illness:    Amanda West is a 76 y.o. female with a hx of iron deficiency anemia, osteoporosis, and spinal stenosis.  She is a former smoker.  She has at least 15-pack-year history.  She has a family history of CAD with both parents died from MI and her brothers have had several heart surgeries.  She presented with chest pain and chest tingling with exertion when seen by Dr. Jacques Navy 06/26/2021.  She reported chest tightness with difficulty swallowing when working in the yard.  This was associated with shortness of breath.  Given her smoking history and family history of heart disease along with her exertional symptoms, she was recommended for CT coronary.  This was never completed.  Our office was contacted for preoperative risk evaluation for anterior cervical spine fusion.  She was recommended for an office visit. She presents with her husband. She denies chest pain or tightness, but rather reports left arm tingling which Dr. Jordan Likes feels is related to her spinal stenosis.   She denies chest tightness. She rides a stationary bike for 1 hr. She also walks about 2 miles several times per week. She denies dyspnea, palpitations, lower extremity swelling, orthopnea, PND, or syncope. She adamantly denies the symptoms documented by Dr. Jacques Navy leading to recommendation for CTA coronary.   Past Medical History:  Diagnosis Date   Abnormality of gait    previous was seen by neurology-- dr c. Anne Hahn--- relased lov 08-31-2019, much improved per pt   Arthritis    Chronic constipation    Cornea scar    right eye   Eyes sensitive to light, bilateral    per pt  right > left due to residual shingles    IDA (iron deficiency anemia)    Neuromuscular disorder (HCC)    Neuropathy   Osteoporosis    Spinal stenosis in cervical region 09/15/2014   Subcutaneous mass    bilateral abdominal    Vitamin D deficiency    Wears glasses     Past Surgical History:  Procedure Laterality Date   ABDOMINAL HERNIA REPAIR  1980s   ANTERIOR CERVICAL DECOMP/DISCECTOMY FUSION N/A 09/21/2017   Procedure: Anterior Cervical Decompression Fusion Cervical Four-Five,Cervical Five-Six,Cervical Six-Seven;  Surgeon: Julio Sicks, MD;  Location: [facility] OR;  Service: Neurosurgery;  Laterality: N/A;  anterior    CATARACT EXTRACTION W/ INTRAOCULAR LENS  IMPLANT, BILATERAL  1990's   MASS EXCISION N/A 12/19/2020   Procedure: EXCISION OF RIGHT LOWER ABDOMEN SUBCUTANEOUS MASS, EXCISION OF LEFT LOWER ABDOMEN SUBCUTANEOUS MASS;  Surgeon: Quentin Ore, MD;  Location: [facility];  Service: General;  Laterality: N/A;   PARS PLANA VITRECTOMY  07/06/2012   Procedure: PARS PLANA VITRECTOMY WITH 25G REMOVAL/SUTURE INTRAOCULAR LENS;  Surgeon: Sherrie George, MD;  Location: [facility] OR;  Service: Ophthalmology;  Laterality: Left;   TUBAL LIGATION     VITRECTOMY AND CATARACT Right ?   IOL replaced    Current Medications: Current Meds  Medication Sig   APPLE CIDER VINEGAR PO Take 450 mg by mouth every evening.   Calcium-Magnesium-Zinc (CAL-MAG-ZINC PO) Take 1 tablet by mouth  in the morning.   Capsicum, Cayenne, (CAYENNE PEPPER PO) Take 455 mg by mouth every evening.   celecoxib (CELEBREX) 200 MG capsule Take 1 capsule (200 mg total) by mouth 2 (two) times daily.   Cholecalciferol (VITAMIN D-3) 125 MCG (5000 UT) TABS Take 5,000 Units by mouth in the morning.   Cinnamon 500 MG capsule Take 1,000 mg by mouth in the morning.   COLLAGEN PO Take 500 mg by mouth every evening.   DULoxetine (CYMBALTA) 20 MG capsule TAKE 1 CAPSULE BY MOUTH ONCE DAILY (Patient taking differently: Take 20  mg by mouth every other day. At bedtime.)   GREEN TEA, CAMELLIA SINENSIS, PO Take 1 capsule by mouth in the morning.   Pediatric Multivitamins-Iron (FLINTSTONES PLUS IRON PO) Take 1 tablet by mouth in the morning.   Potassium 99 MG TABS Take 99 mg by mouth every evening.   senna (SENOKOT) 8.6 MG tablet Take 4 tablets by mouth at bedtime.   TURMERIC PO Take 1 capsule by mouth in the morning and at bedtime.   vitamin B-12 (CYANOCOBALAMIN) 1000 MCG tablet Take 2,000 mcg by mouth in the morning.   vitamin C (ASCORBIC ACID) 500 MG tablet Take 500 mg by mouth in the morning.   zinc sulfate 220 (50 Zn) MG capsule Take 220 mg by mouth in the morning.   [DISCONTINUED] aspirin EC 81 MG tablet Take 81 mg by mouth daily with supper.     Allergies:   Codeine and Pamelor [nortriptyline]   Social History   Socioeconomic History   Marital status: Married    Spouse name: Phylliss BobReed Ostrand   Number of children: 1   Years of education: Not on file   Highest education level: Not on file  Occupational History   Occupation: CARD MERCHANDISE    Employer: [employer]  Tobacco Use   Smoking status: Former    Years: 30.00    Types: Cigarettes    Quit date: 11/16/2013    Years since quitting: 8.4   Smokeless tobacco: Never  Vaping Use   Vaping Use: Never used  Substance and Sexual Activity   Alcohol use: No   Drug use: Never   Sexual activity: Not on file  Other Topics Concern   Not on file  Social History Narrative   Patient is right handed.   Patient drinks 5-6 cup caffeine daily.   Social Determinants of Health   Financial Resource Strain: Not on file  Food Insecurity: Not on file  Transportation Needs: Not on file  Physical Activity: Not on file  Stress: Not on file  Social Connections: Not on file     Family History: The patient's family history includes Cancer in her brother; Diabetes in her sister; Heart disease in her brother, father, and mother.  ROS:   Please see the history  of present illness.     All other systems reviewed and are negative.  EKGs/Labs/Other Studies Reviewed:    The following studies were reviewed today:  none  EKG:  EKG is  ordered today.  The ekg ordered today demonstrates sinus rhythm HR 67 with anteroseptal Q waves, seen in prior tracing  Recent Labs: 07/08/2021: BUN 11; Creatinine, Ser 0.64; Potassium 5.1; Sodium 142 04/23/2022: Hemoglobin 11.6; Platelets 246  Recent Lipid Panel No results found for: CHOL, TRIG, HDL, CHOLHDL, VLDL, LDLCALC, LDLDIRECT   Risk Assessment/Calculations:           Physical Exam:    VS:  BP 126/64 (BP Location: Left  Arm, Patient Position: Sitting, Cuff Size: Normal)   Pulse 67   Ht 5\' 2"  (1.575 m)   Wt 119 lb 9.6 oz (54.3 kg)   SpO2 97%   BMI 21.88 kg/m     Wt Readings from Last 3 Encounters:  05/06/22 119 lb 9.6 oz (54.3 kg)  04/23/22 115 lb 4.8 oz (52.3 kg)  07/04/21 114 lb (51.7 kg)     GEN:  Well nourished, well developed in no acute distress HEENT: Normal NECK: No JVD; No carotid bruits LYMPHATICS: No lymphadenopathy CARDIAC: RRR, no murmurs, rubs, gallops RESPIRATORY:  Clear to auscultation without rales, wheezing or rhonchi  ABDOMEN: Soft, non-tender, non-distended MUSCULOSKELETAL:  No edema; No deformity  SKIN: Warm and dry NEUROLOGIC:  Alert and oriented x 3 PSYCHIATRIC:  Normal affect   ASSESSMENT:    1. Pre-operative clearance   2. Precordial pain   3. Cervical spinal stenosis    PLAN:    In order of problems listed above:  Exertional chest discomfort Former smoker, strong family history of heart disease She now denies chest discomfort. She stopped ASA 1 month ago.    Preoperative evaluation for cardiac risk She denies prior chest tightness. She can complete more than 4.0 METS without angina. She does not have a history of ischemic heart disease, she does not take insulin, she has no history of stroke.  She understands that she currently has a low to moderate risk  for Mace during the perioperative period.  She understands that she may have heart disease that is unrealized.  However, given her activity level and lack of symptoms I do not think additional testing is needed at this time.  Therefore, based on ACC/AHA guidelines, the patient would be at acceptable risk for the planned procedure without further cardiovascular testing.   The patient was advised that if she develops new symptoms prior to surgery to contact our office to arrange for a follow-up visit, and she verbalized understanding.   Follow up with Dr .07/06/21 in 1 year.      Medication Adjustments/Labs and Tests Ordered: Current medicines are reviewed at length with the patient today.  Concerns regarding medicines are outlined above.  Orders Placed This Encounter  Procedures   EKG 12-Lead   No orders of the defined types were placed in this encounter.   Patient Instructions  Medication Instructions:  No Changes *If you need a refill on your cardiac medications before your next appointment, please call your pharmacy*   Lab Work: No Labs If you have labs (blood work) drawn today and your tests are completely normal, you will receive your results only by: MyChart Message (if you have MyChart) OR A paper copy in the mail If you have any lab test that is abnormal or we need to change your treatment, we will call you to review the results.   Testing/Procedures: No Testing   Follow-Up: At [facility], you and your health needs are our priority.  As part of our continuing mission to provide you with exceptional heart care, we have created designated Provider Care Teams.  These Care Teams include your primary Cardiologist (physician) and Advanced Practice Providers (APPs -  Physician Assistants and Nurse Practitioners) who all work together to provide you with the care you need, when you need it.  We recommend signing up for the patient portal called "MyChart".  Sign up  information is provided on this After Visit Summary.  MyChart is used to connect with patients for Virtual Visits (  Telemedicine).  Patients are able to view lab/test results, encounter notes, upcoming appointments, etc.  Non-urgent messages can be sent to your provider as well.   To learn more about what you can do with MyChart, go to [email or web address].    Your next appointment:   1 year(s)  The format for your next appointment:   In Person  Provider:   Parke Poisson, MD       Important Information About Sugar         Signed, Marcelino Duster, [place]  05/06/2022 3:50 PM    [facility]

## 2022-05-06 ENCOUNTER — Encounter: Payer: Self-pay | Admitting: Physician Assistant

## 2022-05-06 ENCOUNTER — Ambulatory Visit: Payer: Medicare Other | Admitting: Physician Assistant

## 2022-05-06 VITALS — BP 126/64 | HR 67 | Ht 62.0 in | Wt 119.6 lb

## 2022-05-06 DIAGNOSIS — M4802 Spinal stenosis, cervical region: Secondary | ICD-10-CM

## 2022-05-06 DIAGNOSIS — Z01818 Encounter for other preprocedural examination: Secondary | ICD-10-CM | POA: Diagnosis not present

## 2022-05-06 DIAGNOSIS — R072 Precordial pain: Secondary | ICD-10-CM

## 2022-05-06 NOTE — Patient Instructions (Signed)
Medication Instructions:  No Changes *If you need a refill on your cardiac medications before your next appointment, please call your pharmacy*   Lab Work: No Labs If you have labs (blood work) drawn today and your tests are completely normal, you will receive your results only by: MyChart Message (if you have MyChart) OR A paper copy in the mail If you have any lab test that is abnormal or we need to change your treatment, we will call you to review the results.   Testing/Procedures: No Testing   Follow-Up: At [facility], you and your health needs are our priority.  As part of our continuing mission to provide you with exceptional heart care, we have created designated Provider Care Teams.  These Care Teams include your primary Cardiologist (physician) and Advanced Practice Providers (APPs -  Physician Assistants and Nurse Practitioners) who all work together to provide you with the care you need, when you need it.  We recommend signing up for the patient portal called "MyChart".  Sign up information is provided on this After Visit Summary.  MyChart is used to connect with patients for Virtual Visits (Telemedicine).  Patients are able to view lab/test results, encounter notes, upcoming appointments, etc.  Non-urgent messages can be sent to your provider as well.   To learn more about what you can do with MyChart, go to [email or web address].    Your next appointment:   1 year(s)  The format for your next appointment:   In Person  Provider:   Gayatri A Acharya, MD       Important Information About Sugar       

## 2022-05-26 ENCOUNTER — Encounter (HOSPITAL_COMMUNITY): Payer: Self-pay | Admitting: Neurosurgery

## 2022-05-26 ENCOUNTER — Other Ambulatory Visit: Payer: Self-pay

## 2022-05-26 NOTE — Progress Notes (Signed)
Anesthesia Follow-up:  Case: [id number] Date/Time: 05/27/22 0915   Procedure: ACDF - C3-C4 - 3C   Anesthesia type: General   Pre-op diagnosis: Stenosis   [facility] / [facility]   Surgeons: Julio Sicks, MD       DISCUSSION: Patient is a 76 year old female scheduled for the above procedure. Surgery was initially scheduled for 05/02/22, but was postponed until she could be re-evaluated by cardiology. She had seen Dr. Jacques Navy on 07/04/21 for chest/left shoulder pain with family history of CAD. A CCTA was ordered for further evaluation, but she never scheduled because reportedly symptoms later attributed to her cervical spine disease. She had cardiology follow-up on 05/06/22 with Micah Flesher, PA-C who wrote,  "Preoperative evaluation for cardiac risk She denies prior chest tightness. She can complete more than 4.0 METS without angina. She does not have a history of ischemic heart disease, she does not take insulin, she has no history of stroke.  She understands that she currently has a low to moderate risk for Mace during the perioperative period.  She understands that she may have heart disease that is unrealized.  However, given her activity level and lack of symptoms I do not think additional testing is needed at this time.   Therefore, based on ACC/AHA guidelines, the patient would be at acceptable risk for the planned procedure without further cardiovascular testing..."     History includes former smoker (quit 11/16/13), iron deficiency anemia, neuropathy, spinal surgery (C4-7 ACDF 09/21/17), abdominal mass (s/p excision of bilateral lower abdominal masses, pathology: calcified nodules with palisaded histiocytes 12/19/20), eye surgery (cataracts extraction 1990s, pars plana vitrectomy 2012, 2013).    She is a same day work-up. Last labs currently available are > 57 days old. Labs as indicated and anesthesia team evaluation on the day of surgery.   VS: Ht 5\' 2"  (1.575 m)   Wt 52.2 kg   BMI  21.03 kg/m  BP Readings from Last 3 Encounters:  05/06/22 126/64  04/23/22 127/75  07/04/21 124/62   Pulse Readings from Last 3 Encounters:  05/06/22 67  04/23/22 65  07/04/21 63      PROVIDERS: 07/06/21, MD is PCP  Lupita Raider, MD is cardiologist    LABS: 04/23/22 CBC showed H/H 11.6/35.1, PLT 246. Per my previous note, she had a normal CMP 10/16/21 through 13/9/22.   IMAGES: MRI C-spine 04/03/22 ([facility]): IMPRESSION: 1. ACDF C4 through C7 without stenosis 2. Central disc protrusion at C3-4. Bilateral facet hypertrophy. Moderate to severe spinal stenosis with cord compression right G reater than left. Cord signal normal.     EKG: 05/06/22 ([facility]): NSR. LVH with repolarization abnormality.  Cannot rule out septal infarct (age undetermined). ST/T wave abnormality, consider lateral ischemia.  - Inferior T wave abnormality is less evident when compared to 07/04/21 tracing. She has had inferolateral T wave abnormality on EKG dating back to at least 06/29/13.       CV: She had a "unremarkable" prolonged cardiac monitor in 2014.  She reported a normal stress test > 15 years ago.   Past Medical History:  Diagnosis Date   Abnormality of gait    previous was seen by neurology-- dr c. 2015--- relased lov 08-31-2019, much improved per pt   Arthritis    Chronic constipation    Cornea scar    right eye   Eyes sensitive to light, bilateral    per pt right > left due to residual shingles  IDA (iron deficiency anemia)    Neuromuscular disorder (HCC)    Neuropathy   Osteoporosis    Spinal stenosis in cervical region 09/15/2014   Subcutaneous mass    bilateral abdominal    Vitamin D deficiency    Wears glasses     Past Surgical History:  Procedure Laterality Date   ABDOMINAL HERNIA REPAIR  1980s   ANTERIOR CERVICAL DECOMP/DISCECTOMY FUSION N/A 09/21/2017   Procedure: Anterior Cervical Decompression Fusion Cervical Four-Five,Cervical  Five-Six,Cervical Six-Seven;  Surgeon: Julio Sicks, MD;  Location: [facility] OR;  Service: Neurosurgery;  Laterality: N/A;  anterior    CATARACT EXTRACTION W/ INTRAOCULAR LENS  IMPLANT, BILATERAL  1990's   MASS EXCISION N/A 12/19/2020   Procedure: EXCISION OF RIGHT LOWER ABDOMEN SUBCUTANEOUS MASS, EXCISION OF LEFT LOWER ABDOMEN SUBCUTANEOUS MASS;  Surgeon: Quentin Ore, MD;  Location: [facility];  Service: General;  Laterality: N/A;   PARS PLANA VITRECTOMY  07/06/2012   Procedure: PARS PLANA VITRECTOMY WITH 25G REMOVAL/SUTURE INTRAOCULAR LENS;  Surgeon: Sherrie George, MD;  Location: [facility] OR;  Service: Ophthalmology;  Laterality: Left;   TUBAL LIGATION     VITRECTOMY AND CATARACT Right ?   IOL replaced    MEDICATIONS: No current facility-administered medications for this encounter.    APPLE CIDER VINEGAR PO   aspirin EC 81 MG tablet   Calcium-Magnesium-Zinc (CAL-MAG-ZINC PO)   Capsicum, Cayenne, (CAYENNE PEPPER PO)   carboxymethylcellulose (REFRESH PLUS) 0.5 % SOLN   Cholecalciferol (VITAMIN D) 50 MCG (2000 UT) tablet   Cinnamon 500 MG capsule   COLLAGEN PO   DULoxetine (CYMBALTA) 20 MG capsule   GREEN TEA, CAMELLIA SINENSIS, PO   MELATONIN PO   Pediatric Multivitamins-Iron (FLINTSTONES PLUS IRON PO)   Potassium 99 MG TABS   senna (SENOKOT) 8.6 MG tablet   TURMERIC PO   vitamin B-12 (CYANOCOBALAMIN) 1000 MCG tablet   vitamin C (ASCORBIC ACID) 500 MG tablet   zinc gluconate 50 MG tablet    Shonna Chock, PA-C Surgical Short Stay/Anesthesiology [facility] Phone [phone] [facility] Phone [phone] 05/26/2022 1:03 PM

## 2022-05-26 NOTE — Anesthesia Preprocedure Evaluation (Addendum)
Anesthesia Evaluation  Patient identified by MRN, date of birth, ID band Patient awake    Reviewed: Allergy & Precautions, NPO status , Patient's Chart, lab work & pertinent test results  Airway Mallampati: II  TM Distance: >3 FB Neck ROM: Full    Dental no notable dental hx.    Pulmonary neg pulmonary ROS, former smoker,    Pulmonary exam normal        Cardiovascular negative cardio ROS   Rhythm:Regular Rate:Normal     Neuro/Psych negative neurological ROS  negative psych ROS   GI/Hepatic negative GI ROS, Neg liver ROS,   Endo/Other  negative endocrine ROS  Renal/GU negative Renal ROS  negative genitourinary   Musculoskeletal  (+) Arthritis , Osteoarthritis,  Cervical stenosis   Abdominal Normal abdominal exam  (+)   Peds  Hematology  (+) Blood dyscrasia, anemia ,   Anesthesia Other Findings   Reproductive/Obstetrics                           Anesthesia Physical Anesthesia Plan  ASA: 2  Anesthesia Plan: General   Post-op Pain Management: Celebrex PO (pre-op)* and Tylenol PO (pre-op)*   Induction: Intravenous  PONV Risk Score and Plan: 3 and Ondansetron, Dexamethasone and Treatment may vary due to age or medical condition  Airway Management Planned: Mask and Oral ETT  Additional Equipment: None  Intra-op Plan:   Post-operative Plan: Extubation in OR  Informed Consent: I have reviewed the patients History and Physical, chart, labs and discussed the procedure including the risks, benefits and alternatives for the proposed anesthesia with the patient or authorized representative who has indicated his/her understanding and acceptance.     Dental advisory given  Plan Discussed with: CRNA  Anesthesia Plan Comments: (PAT note written 05/26/2022 by Myra Gianotti, PA-C.  Lab Results      Component                Value               Date                      WBC                       4.2                 04/23/2022                HGB                      11.6 (L)            04/23/2022                HCT                      35.1 (L)            04/23/2022                MCV                      101.7 (H)           04/23/2022                PLT  246                 04/23/2022           Lab Results      Component                Value               Date                      NA                       142                 07/08/2021                K                        5.1                 07/08/2021                CO2                      28                  07/08/2021                GLUCOSE                  72                  07/08/2021                BUN                      11                  07/08/2021                CREATININE               0.64                07/08/2021                CALCIUM                  9.5                 07/08/2021                EGFR                     92                  07/08/2021                GFRNONAA                 >60                 09/16/2020          )      Anesthesia Quick Evaluation

## 2022-05-26 NOTE — Pre-Procedure Instructions (Signed)
[facility] [address], [place] - [address] [place] [address] Phone: [phone] Fax: [phone]   PCP - Lupita Raider, MD Cardiologist - Parke Poisson, MD  EKG - 03/06/2022 Stress Test - Over 15 years ago per patient. Normal per patient ECHO - 12/30/12  Aspirin Instructions: "has been stopped for 2 months"  ERAS Protcol - NPO  Anesthesia review: Y  Patient verbally denies any shortness of breath, fever, cough and chest pain during phone call   -------------  SDW INSTRUCTIONS given:  Your procedure is scheduled on 05/27/22.  Report to [facility] Main Entrance "A" at 0700 A.M., and check in at the Admitting office.  Call this number if you have problems the morning of surgery:  [phone]   Remember:  Do not eat or drink after midnight the night before your surgery   Take these medicines the morning of surgery with A SIP OF WATER  Refresh eye drops- If needed   As of today, STOP taking any Aspirin (unless otherwise instructed by your surgeon) Aleve, Naproxen, Ibuprofen, Motrin, Advil, Goody's, BC's, all herbal medications, fish oil, and all vitamins.                      Do not wear jewelry, make up, or nail polish            Do not wear lotions, powders, perfumes/colognes, or deodorant.            Do not shave 48 hours prior to surgery.  Men may shave face and neck.            Do not bring valuables to the hospital.            [facility] is not responsible for any belongings or valuables.  Do NOT Smoke (Tobacco/Vaping) 24 hours prior to your procedure If you use a CPAP at night, you may bring all equipment for your overnight stay.   Contacts, glasses, dentures or bridgework may not be worn into surgery.      For patients admitted to the hospital, discharge time will be determined by your treatment team.   Patients discharged the day of surgery will not be allowed to drive home, and someone needs to stay  with them for 24 hours.    Special instructions:   [facility]- Preparing For Surgery  Before surgery, you can play an important role. Because skin is not sterile, your skin needs to be as free of germs as possible. You can reduce the number of germs on your skin by washing with CHG (chlorahexidine gluconate) Soap before surgery.  CHG is an antiseptic cleaner which kills germs and bonds with the skin to continue killing germs even after washing.    Oral Hygiene is also important to reduce your risk of infection.  Remember - BRUSH YOUR TEETH THE MORNING OF SURGERY WITH YOUR REGULAR TOOTHPASTE  Please do not use if you have an allergy to CHG or antibacterial soaps. If your skin becomes reddened/irritated stop using the CHG.  Do not shave (including legs and underarms) for at least 48 hours prior to first CHG shower. It is OK to shave your face.  Please follow these instructions carefully.   Shower the NIGHT BEFORE SURGERY and the MORNING OF SURGERY with DIAL Soap.   Pat yourself dry with a CLEAN TOWEL.  Wear CLEAN PAJAMAS to bed the night before surgery  Place CLEAN SHEETS on your bed the  night of your first shower and DO NOT SLEEP WITH PETS.   Day of Surgery: Please shower morning of surgery  Wear Clean/Comfortable clothing the morning of surgery Do not apply any deodorants/lotions.   Remember to brush your teeth WITH YOUR REGULAR TOOTHPASTE.   Questions were answered. Patient verbalized understanding of instructions.

## 2022-05-27 ENCOUNTER — Ambulatory Visit (HOSPITAL_COMMUNITY): Payer: Medicare Other

## 2022-05-27 ENCOUNTER — Other Ambulatory Visit: Payer: Self-pay

## 2022-05-27 ENCOUNTER — Ambulatory Visit (HOSPITAL_COMMUNITY): Payer: Medicare Other | Admitting: Emergency Medicine

## 2022-05-27 ENCOUNTER — Encounter (HOSPITAL_COMMUNITY): Payer: Self-pay | Admitting: Neurosurgery

## 2022-05-27 ENCOUNTER — Ambulatory Visit (HOSPITAL_BASED_OUTPATIENT_CLINIC_OR_DEPARTMENT_OTHER): Payer: Medicare Other | Admitting: Emergency Medicine

## 2022-05-27 ENCOUNTER — Observation Stay (HOSPITAL_COMMUNITY)
Admission: RE | Admit: 2022-05-27 | Discharge: 2022-05-28 | Disposition: A | Discharge status: Home / Self Care | Payer: Medicare Other | Attending: Neurosurgery | Admitting: Neurosurgery

## 2022-05-27 ENCOUNTER — Ambulatory Visit (HOSPITAL_COMMUNITY)
Admission: RE | Disposition: A | Discharge status: Home / Self Care | Payer: Self-pay | Source: Home / Self Care | Attending: Neurosurgery

## 2022-05-27 DIAGNOSIS — G959 Disease of spinal cord, unspecified: Secondary | ICD-10-CM | POA: Diagnosis present

## 2022-05-27 DIAGNOSIS — G992 Myelopathy in diseases classified elsewhere: Secondary | ICD-10-CM | POA: Diagnosis not present

## 2022-05-27 DIAGNOSIS — M4802 Spinal stenosis, cervical region: Secondary | ICD-10-CM

## 2022-05-27 DIAGNOSIS — Z79899 Other long term (current) drug therapy: Secondary | ICD-10-CM | POA: Diagnosis not present

## 2022-05-27 DIAGNOSIS — Z87891 Personal history of nicotine dependence: Secondary | ICD-10-CM | POA: Diagnosis not present

## 2022-05-27 DIAGNOSIS — Z981 Arthrodesis status: Secondary | ICD-10-CM | POA: Diagnosis not present

## 2022-05-27 DIAGNOSIS — Z7982 Long term (current) use of aspirin: Secondary | ICD-10-CM | POA: Diagnosis not present

## 2022-05-27 DIAGNOSIS — M4322 Fusion of spine, cervical region: Secondary | ICD-10-CM | POA: Diagnosis not present

## 2022-05-27 HISTORY — PX: ANTERIOR CERVICAL DECOMP/DISCECTOMY FUSION: SHX1161

## 2022-05-27 LAB — CBC
HCT: 36.9 % (ref 36.0–46.0)
Hemoglobin: 12.3 g/dL (ref 12.0–15.0)
MCH: 33.6 pg (ref 26.0–34.0)
MCHC: 33.3 g/dL (ref 30.0–36.0)
MCV: 100.8 fL — ABNORMAL HIGH (ref 80.0–100.0)
Platelets: 226 10*3/uL (ref 150–400)
RBC: 3.66 MIL/uL — ABNORMAL LOW (ref 3.87–5.11)
RDW: 12 % (ref 11.5–15.5)
WBC: 3.8 10*3/uL — ABNORMAL LOW (ref 4.0–10.5)
nRBC: 0 % (ref 0.0–0.2)

## 2022-05-27 LAB — BASIC METABOLIC PANEL
Anion gap: 9 (ref 5–15)
BUN: 8 mg/dL (ref 8–23)
CO2: 24 mmol/L (ref 22–32)
Calcium: 9.6 mg/dL (ref 8.9–10.3)
Chloride: 105 mmol/L (ref 98–111)
Creatinine, Ser: 0.5 mg/dL (ref 0.44–1.00)
GFR, Estimated: >60 mL/min (ref >60)
Glucose, Bld: 87 mg/dL (ref 70–99)
Potassium: 3.7 mmol/L (ref 3.5–5.1)
Sodium: 138 mmol/L (ref 135–145)

## 2022-05-27 LAB — SURGICAL PCR SCREEN
MRSA, PCR: NEGATIVE
Staphylococcus aureus: NEGATIVE

## 2022-05-27 SURGERY — ANTERIOR CERVICAL DECOMPRESSION/DISCECTOMY FUSION 1 LEVEL
Anesthesia: General | Site: Spine Cervical

## 2022-05-27 MED ORDER — 0.9 % SODIUM CHLORIDE (POUR BTL) OPTIME
TOPICAL | Status: DC | PRN
  Administered 2022-05-27: 1000 mL

## 2022-05-27 MED ORDER — MENTHOL 3 MG MT LOZG: 1.0000 | LOZENGE | OROMUCOSAL | Status: DC | PRN

## 2022-05-27 MED ORDER — VITAMIN B-12 1000 MCG PO TABS: 1000.0000 ug | ORAL_TABLET | Freq: Every morning | ORAL | Status: DC

## 2022-05-27 MED ORDER — LIDOCAINE 2% (20 MG/ML) 5 ML SYRINGE
INTRAMUSCULAR | Status: AC
Start: 2022-05-27 — End: ?
  Filled 2022-05-27: qty 5

## 2022-05-27 MED ORDER — SUGAMMADEX SODIUM 200 MG/2ML IV SOLN
INTRAVENOUS | Status: DC | PRN
  Administered 2022-05-27: 200 mg via INTRAVENOUS

## 2022-05-27 MED ORDER — DULOXETINE HCL 20 MG PO CPEP
20.0000 mg | ORAL_CAPSULE | ORAL | Status: DC
  Administered 2022-05-27: 20 mg via ORAL
  Filled 2022-05-27: qty 1

## 2022-05-27 MED ORDER — SODIUM CHLORIDE 0.9% FLUSH
3.0000 mL | Freq: Two times a day (BID) | INTRAVENOUS | Status: DC
  Administered 2022-05-27 (×2): 3 mL via INTRAVENOUS

## 2022-05-27 MED ORDER — PROPOFOL 10 MG/ML IV BOLUS
INTRAVENOUS | Status: DC | PRN
  Administered 2022-05-27: 80 mg via INTRAVENOUS

## 2022-05-27 MED ORDER — EPHEDRINE SULFATE-NACL 50-0.9 MG/10ML-% IV SOSY
PREFILLED_SYRINGE | INTRAVENOUS | Status: DC | PRN
  Administered 2022-05-27: 10 mg via INTRAVENOUS
  Administered 2022-05-27 (×3): 5 mg via INTRAVENOUS

## 2022-05-27 MED ORDER — PROPOFOL 10 MG/ML IV BOLUS
INTRAVENOUS | Status: AC
  Filled 2022-05-27: qty 20

## 2022-05-27 MED ORDER — VITAMIN D 25 MCG (1000 UNIT) PO TABS
2000.0000 [IU] | ORAL_TABLET | Freq: Every day | ORAL | Status: DC
  Administered 2022-05-28: 2000 [IU] via ORAL
  Filled 2022-05-27: qty 2

## 2022-05-27 MED ORDER — CEFAZOLIN SODIUM-DEXTROSE 1-4 GM/50ML-% IV SOLN
1.0000 g | Freq: Three times a day (TID) | INTRAVENOUS | Status: AC
  Administered 2022-05-27 – 2022-05-28 (×2): 1 g via INTRAVENOUS
  Filled 2022-05-27 (×3): qty 50

## 2022-05-27 MED ORDER — ROCURONIUM BROMIDE 10 MG/ML (PF) SYRINGE
PREFILLED_SYRINGE | INTRAVENOUS | Status: DC | PRN
  Administered 2022-05-27: 60 mg via INTRAVENOUS

## 2022-05-27 MED ORDER — SENNA 8.6 MG PO TABS
1.0000 | ORAL_TABLET | Freq: Every day | ORAL | Status: DC
Start: 2022-05-27 — End: 2022-05-28
  Administered 2022-05-27: 8.6 mg via ORAL
  Filled 2022-05-27: qty 1

## 2022-05-27 MED ORDER — PHENYLEPHRINE 80 MCG/ML (10ML) SYRINGE FOR IV PUSH (FOR BLOOD PRESSURE SUPPORT)
PREFILLED_SYRINGE | INTRAVENOUS | Status: DC | PRN
  Administered 2022-05-27: 80 ug via INTRAVENOUS

## 2022-05-27 MED ORDER — FENTANYL CITRATE (PF) 250 MCG/5ML IJ SOLN
INTRAMUSCULAR | Status: AC
  Filled 2022-05-27: qty 5

## 2022-05-27 MED ORDER — PHENYLEPHRINE 80 MCG/ML (10ML) SYRINGE FOR IV PUSH (FOR BLOOD PRESSURE SUPPORT)
PREFILLED_SYRINGE | INTRAVENOUS | Status: AC
  Filled 2022-05-27: qty 10

## 2022-05-27 MED ORDER — METHOCARBAMOL 500 MG PO TABS
500.0000 mg | ORAL_TABLET | Freq: Four times a day (QID) | ORAL | Status: DC | PRN
  Administered 2022-05-27 (×2): 500 mg via ORAL
  Filled 2022-05-27 (×2): qty 1

## 2022-05-27 MED ORDER — THROMBIN 5000 UNITS EX SOLR
CUTANEOUS | Status: AC
Start: 2022-05-27 — End: ?
  Filled 2022-05-27: qty 10000

## 2022-05-27 MED ORDER — LIDOCAINE 2% (20 MG/ML) 5 ML SYRINGE
INTRAMUSCULAR | Status: DC | PRN
  Administered 2022-05-27: 60 mg via INTRAVENOUS

## 2022-05-27 MED ORDER — TURMERIC 500 MG PO TABS: ORAL_TABLET | Freq: Every day | ORAL | Status: DC

## 2022-05-27 MED ORDER — HYDROMORPHONE HCL 1 MG/ML IJ SOLN: 1.0000 mg | INTRAMUSCULAR | Status: DC | PRN

## 2022-05-27 MED ORDER — SODIUM CHLORIDE 0.9 % IV SOLN
250.0000 mL | INTRAVENOUS | Status: DC
  Administered 2022-05-27: 250 mL via INTRAVENOUS

## 2022-05-27 MED ORDER — ONDANSETRON HCL 4 MG/2ML IJ SOLN
INTRAMUSCULAR | Status: DC | PRN
  Administered 2022-05-27: 4 mg via INTRAVENOUS

## 2022-05-27 MED ORDER — PHENOL 1.4 % MT LIQD: 1.0000 | OROMUCOSAL | Status: DC | PRN

## 2022-05-27 MED ORDER — MELATONIN 5 MG PO TABS: 10.0000 mg | ORAL_TABLET | Freq: Every evening | ORAL | Status: DC | PRN

## 2022-05-27 MED ORDER — CHLORHEXIDINE GLUCONATE 0.12 % MT SOLN
15.0000 mL | Freq: Once | OROMUCOSAL | Status: AC
  Administered 2022-05-27: 15 mL via OROMUCOSAL
  Filled 2022-05-27: qty 15

## 2022-05-27 MED ORDER — CELECOXIB 200 MG PO CAPS
200.0000 mg | ORAL_CAPSULE | Freq: Once | ORAL | Status: AC
  Administered 2022-05-27: 200 mg via ORAL
  Filled 2022-05-27: qty 1

## 2022-05-27 MED ORDER — AMISULPRIDE (ANTIEMETIC) 5 MG/2ML IV SOLN
10.0000 mg | Freq: Once | INTRAVENOUS | Status: AC
  Administered 2022-05-27: 10 mg via INTRAVENOUS

## 2022-05-27 MED ORDER — METHOCARBAMOL 1000 MG/10ML IJ SOLN
500.0000 mg | Freq: Four times a day (QID) | INTRAVENOUS | Status: DC | PRN
  Filled 2022-05-27: qty 5

## 2022-05-27 MED ORDER — ASCORBIC ACID 500 MG PO TABS
500.0000 mg | ORAL_TABLET | Freq: Every day | ORAL | Status: DC
  Administered 2022-05-28: 500 mg via ORAL
  Filled 2022-05-27: qty 1

## 2022-05-27 MED ORDER — SODIUM CHLORIDE 0.9% FLUSH
3.0000 mL | INTRAVENOUS | Status: DC | PRN
Start: 2022-05-27 — End: 2022-05-28

## 2022-05-27 MED ORDER — FENTANYL CITRATE (PF) 100 MCG/2ML IJ SOLN: 25.0000 ug | INTRAMUSCULAR | Status: DC | PRN

## 2022-05-27 MED ORDER — AMISULPRIDE (ANTIEMETIC) 5 MG/2ML IV SOLN
INTRAVENOUS | Status: AC
  Filled 2022-05-27: qty 4

## 2022-05-27 MED ORDER — NAPHAZOLINE-GLYCERIN 0.012-0.25 % OP SOLN: 1.0000 [drp] | Freq: Four times a day (QID) | OPHTHALMIC | Status: DC | PRN

## 2022-05-27 MED ORDER — HYDROCODONE-ACETAMINOPHEN 5-325 MG PO TABS: 1.0000 | ORAL_TABLET | ORAL | Status: DC | PRN

## 2022-05-27 MED ORDER — ROCURONIUM BROMIDE 10 MG/ML (PF) SYRINGE
PREFILLED_SYRINGE | INTRAVENOUS | Status: AC
  Filled 2022-05-27: qty 10

## 2022-05-27 MED ORDER — ONDANSETRON HCL 4 MG/2ML IJ SOLN
INTRAMUSCULAR | Status: AC
Start: 2022-05-27 — End: ?
  Filled 2022-05-27: qty 2

## 2022-05-27 MED ORDER — ACETAMINOPHEN 650 MG RE SUPP: 650.0000 mg | RECTAL | Status: DC | PRN

## 2022-05-27 MED ORDER — LACTATED RINGERS IV SOLN
INTRAVENOUS | Status: DC
Start: 2022-05-27 — End: 2022-05-27

## 2022-05-27 MED ORDER — ZINC GLUCONATE 50 MG PO TABS: 50.0000 mg | ORAL_TABLET | Freq: Every day | ORAL | Status: DC

## 2022-05-27 MED ORDER — ONDANSETRON HCL 4 MG/2ML IJ SOLN: 4.0000 mg | Freq: Four times a day (QID) | INTRAMUSCULAR | Status: DC | PRN

## 2022-05-27 MED ORDER — ONDANSETRON HCL 4 MG PO TABS: 4.0000 mg | ORAL_TABLET | Freq: Four times a day (QID) | ORAL | Status: DC | PRN

## 2022-05-27 MED ORDER — CEFAZOLIN SODIUM-DEXTROSE 2-3 GM-%(50ML) IV SOLR
INTRAVENOUS | Status: DC | PRN
  Administered 2022-05-27: 2 g via INTRAVENOUS

## 2022-05-27 MED ORDER — PHENYLEPHRINE HCL-NACL 20-0.9 MG/250ML-% IV SOLN
INTRAVENOUS | Status: DC | PRN
  Administered 2022-05-27: 35 ug/min via INTRAVENOUS

## 2022-05-27 MED ORDER — HYDROCODONE-ACETAMINOPHEN 10-325 MG PO TABS
1.0000 | ORAL_TABLET | ORAL | Status: DC | PRN
  Administered 2022-05-27 (×2): 1 via ORAL
  Filled 2022-05-27 (×2): qty 1

## 2022-05-27 MED ORDER — DEXAMETHASONE SODIUM PHOSPHATE 4 MG/ML IJ SOLN
INTRAMUSCULAR | Status: DC | PRN
  Administered 2022-05-27: 5 mg via INTRAVENOUS

## 2022-05-27 MED ORDER — CARBOXYMETHYLCELLULOSE SODIUM 0.5 % OP SOLN: 1.0000 [drp] | Freq: Three times a day (TID) | OPHTHALMIC | Status: DC | PRN

## 2022-05-27 MED ORDER — CEFAZOLIN SODIUM 1 G IJ SOLR
INTRAMUSCULAR | Status: AC
  Filled 2022-05-27: qty 20

## 2022-05-27 MED ORDER — POTASSIUM 99 MG PO TABS: 99.0000 mg | ORAL_TABLET | Freq: Every evening | ORAL | Status: DC

## 2022-05-27 MED ORDER — ACETAMINOPHEN 500 MG PO TABS
1000.0000 mg | ORAL_TABLET | Freq: Once | ORAL | Status: AC
  Administered 2022-05-27: 1000 mg via ORAL
  Filled 2022-05-27: qty 2

## 2022-05-27 MED ORDER — FENTANYL CITRATE (PF) 100 MCG/2ML IJ SOLN
INTRAMUSCULAR | Status: DC | PRN
  Administered 2022-05-27 (×3): 50 ug via INTRAVENOUS
  Administered 2022-05-27: 25 ug via INTRAVENOUS

## 2022-05-27 MED ORDER — DEXAMETHASONE SODIUM PHOSPHATE 10 MG/ML IJ SOLN
INTRAMUSCULAR | Status: AC
  Filled 2022-05-27: qty 1

## 2022-05-27 MED ORDER — ORAL CARE MOUTH RINSE: 15.0000 mL | Freq: Once | OROMUCOSAL | Status: AC

## 2022-05-27 MED ORDER — THROMBIN (RECOMBINANT) 5000 UNITS EX SOLR: CUTANEOUS | Status: DC | PRN

## 2022-05-27 MED ORDER — ACETAMINOPHEN 325 MG PO TABS
650.0000 mg | ORAL_TABLET | ORAL | Status: DC | PRN
  Administered 2022-05-28: 325 mg via ORAL
  Filled 2022-05-27: qty 2

## 2022-05-27 SURGICAL SUPPLY — 54 items
BAG COUNTER SPONGE SURGICOUNT (BAG) ×2 IMPLANT
BAG DECANTER FOR FLEXI CONT (MISCELLANEOUS) ×2 IMPLANT
BAND RUBBER #18 3X1/16 STRL (MISCELLANEOUS) ×4 IMPLANT
BENZOIN TINCTURE PRP APPL 2/3 (GAUZE/BANDAGES/DRESSINGS) ×2 IMPLANT
BUR MATCHSTICK NEURO 3.0 LAGG (BURR) ×2 IMPLANT
CAGE PEEK 7X14X11 (Cage) ×2 IMPLANT
CANISTER SUCT 3000ML PPV (MISCELLANEOUS) ×2 IMPLANT
CARTRIDGE OIL MAESTRO DRILL (MISCELLANEOUS) ×1 IMPLANT
DIFFUSER DRILL AIR PNEUMATIC (MISCELLANEOUS) ×2 IMPLANT
DRAPE C-ARM 42X72 X-RAY (DRAPES) ×4 IMPLANT
DRAPE LAPAROTOMY 100X72 PEDS (DRAPES) ×2 IMPLANT
DRAPE MICROSCOPE LEICA (MISCELLANEOUS) ×2 IMPLANT
DURAPREP 6ML APPLICATOR 50/CS (WOUND CARE) ×2 IMPLANT
ELECT COATED BLADE 2.86 ST (ELECTRODE) ×2 IMPLANT
ELECT REM PT RETURN 9FT ADLT (ELECTROSURGICAL) ×2
ELECTRODE REM PT RTRN 9FT ADLT (ELECTROSURGICAL) ×1 IMPLANT
GAUZE 4X4 16PLY ~~LOC~~+RFID DBL (SPONGE) IMPLANT
GAUZE SPONGE 4X4 12PLY STRL (GAUZE/BANDAGES/DRESSINGS) ×2 IMPLANT
GLOVE ECLIPSE 9.0 STRL (GLOVE) ×2 IMPLANT
GLOVE EXAM NITRILE XL STR (GLOVE) IMPLANT
GOWN STRL REUS W/ TWL LRG LVL3 (GOWN DISPOSABLE) IMPLANT
GOWN STRL REUS W/ TWL XL LVL3 (GOWN DISPOSABLE) IMPLANT
GOWN STRL REUS W/TWL 2XL LVL3 (GOWN DISPOSABLE) IMPLANT
GOWN STRL REUS W/TWL LRG LVL3 (GOWN DISPOSABLE)
GOWN STRL REUS W/TWL XL LVL3 (GOWN DISPOSABLE)
HALTER HD/CHIN CERV TRACTION D (MISCELLANEOUS) ×2 IMPLANT
HEMOSTAT POWDER KIT SURGIFOAM (HEMOSTASIS) IMPLANT
KIT BASIN OR (CUSTOM PROCEDURE TRAY) ×2 IMPLANT
KIT TURNOVER KIT B (KITS) ×2 IMPLANT
NDL SPNL 20GX3.5 QUINCKE YW (NEEDLE) ×1 IMPLANT
NEEDLE SPNL 20GX3.5 QUINCKE YW (NEEDLE) ×2 IMPLANT
NS IRRIG 1000ML POUR BTL (IV SOLUTION) ×2 IMPLANT
OIL CARTRIDGE MAESTRO DRILL (MISCELLANEOUS) ×2
PACK LAMINECTOMY NEURO (CUSTOM PROCEDURE TRAY) ×2 IMPLANT
PAD ARMBOARD 7.5X6 YLW CONV (MISCELLANEOUS) ×6 IMPLANT
PLATE 23MM (Plate) ×1 IMPLANT
PUTTY DBX 1CC (Putty) ×2 IMPLANT
PUTTY DBX 1CC DEPUY (Putty) IMPLANT
SCREW ST 13X4XST VA NS SPNE (Screw) IMPLANT
SCREW ST 15X4XST VA NS SPNE (Screw) IMPLANT
SCREW ST VAR 4 ATL (Screw) ×8 IMPLANT
SPACER SPNL 11X14X7XPEEK CVD (Cage) IMPLANT
SPCR SPNL 11X14X7XPEEK CVD (Cage) ×1 IMPLANT
SPONGE INTESTINAL PEANUT (DISPOSABLE) ×2 IMPLANT
SPONGE SURGIFOAM ABS GEL SZ50 (HEMOSTASIS) ×2 IMPLANT
STRIP CLOSURE SKIN 1/2X4 (GAUZE/BANDAGES/DRESSINGS) ×2 IMPLANT
SUT VIC AB 3-0 SH 8-18 (SUTURE) ×2 IMPLANT
SUT VIC AB 4-0 RB1 18 (SUTURE) ×2 IMPLANT
TAPE CLOTH 4X10 WHT NS (GAUZE/BANDAGES/DRESSINGS) ×2 IMPLANT
TAPE CLOTH SURG 4X10 WHT LF (GAUZE/BANDAGES/DRESSINGS) ×1 IMPLANT
TOWEL GREEN STERILE (TOWEL DISPOSABLE) ×2 IMPLANT
TOWEL GREEN STERILE FF (TOWEL DISPOSABLE) ×2 IMPLANT
TRAP SPECIMEN MUCUS 40CC (MISCELLANEOUS) ×2 IMPLANT
WATER STERILE IRR 1000ML POUR (IV SOLUTION) ×2 IMPLANT

## 2022-05-27 NOTE — Anesthesia Procedure Notes (Signed)
Procedure Name: Intubation Date/Time: 05/27/2022 10:22 AM  Performed by: Caren Macadam, CRNAPre-anesthesia Checklist: Patient identified, Emergency Drugs available, Suction available and Patient being monitored Patient Re-evaluated:Patient Re-evaluated prior to induction Oxygen Delivery Method: Circle system utilized Preoxygenation: Pre-oxygenation with 100% oxygen Induction Type: IV induction Ventilation: Mask ventilation without difficulty Laryngoscope Size: Glidescope (elective due to neck issues) Grade View: Grade I Tube type: Oral Tube size: 7.0 mm Number of attempts: 1 Airway Equipment and Method: Stylet and Video-laryngoscopy Placement Confirmation: ETT inserted through vocal cords under direct vision, positive ETCO2 and breath sounds checked- equal and bilateral Secured at: 22 cm Tube secured with: Tape Dental Injury: Teeth and Oropharynx as per pre-operative assessment  Comments: Elective glide scope. Head and neck remain neutral during intubation.

## 2022-05-27 NOTE — Op Note (Signed)
Date of procedure: 05/27/2022  Date of dictation: Same  Service: Neurosurgery  Preoperative diagnosis: C3-4 stenosis with myelopathy  Postoperative diagnosis: Same  Procedure Name: C3-4 anterior cervical discectomy with interbody fusion utilizing interbody cage, will curbside autograft, and anterior plate instrumentation  Surgeon:Elya Diloreto A.Crystle Carelli, M.D.  Asst. Surgeon: Doran Durand, NP  Anesthesia: General  Indication: 76 year old female status post prior C4-C7 anterior cervical discectomy and fusion presents with neck pain with radiating pain numbness and weakness into both upper and lower extremities.  Work-up demonstrates evidence of severe stenosis with ongoing cord compression at C3-4 secondary to central disc herniation.  Patient presents now for anterior cervical decompression and fusion in hopes of improving her symptoms.  Operative note: After induction anesthesia, patient positioned supine with neck slightly extended and held in place with halter traction.  Patient's anterior cervical region prepped and draped sterilely.  Incision made overlying C3-4.  Dissection performed on the right.  Retractor placed.  Fluoroscopy used.  Level confirmed.  Disc space incised.  Discectomy performed using various instruments down to level the posterior annulus.  Microscope brought into the field used out the remainder of the discectomy.  Remaining aspects of annulus and osteophytes removed using high-speed drill down to the level of the posterior logical ligament.  Posterior logical was then elevated and resected in a piecemeal fashion.  Underlying thecal sac was identified.  A wide central decompression then performed undercutting the bodies of C3 and C4.  Decompression then proceeded each neural foramina.  Wide anterior foraminotomies were performed on the course of the exiting C4 nerve roots bilaterally.  This point a very thorough decompression been achieved.  There was no evidence of injury to the thecal sac  or nerve roots.  Wound was then irrigated.  Gelfoam was placed topically for hemostasis.  Gelfoam removed.  7 mm Medtronic anatomic peek cage was then packed with locally harvested autograft.  This then impacted into place and recessed slightly from the anterior cortical margin.  23 mm Atlantis anterior circumflex plate was then placed over the C3 and C4 level.  The inferior aspect of the plate overrode the superior aspect of the previous plate.  This was trimmed down somewhat using high-speed drill but overall I found this to be acceptable.  Pilot holes were drilled.  Variable angle screws were placed both in the C3 and C4 with good purchase.  All screws and a final tightening.  Locking screws engaged.  Final images reveal good position of the cages and the hardware at the proper operative level with normal alignment of spine.  Wound was then irrigated.  Hemostasis was assured.  Wound is then closed in layers with Vicryl sutures.  Steri-Strips and sterile dressing were applied.  No apparent complications.  Patient tolerated the procedure well and she returns to the recovery room postop.

## 2022-05-27 NOTE — Transfer of Care (Signed)
Immediate Anesthesia Transfer of Care Note  Patient: ROBERT SUNGA  Procedure(s) Performed: CERVICAL THREE-FOUR ANTERIOR CERVICAL DECOMPRESSION/DISCECTOMY FUSION (Spine Cervical)  Patient Location: PACU  Anesthesia Type:General  Level of Consciousness: awake  Airway & Oxygen Therapy: Patient Spontanous Breathing and Patient connected to face mask oxygen  Post-op Assessment: Report given to RN and Post -op Vital signs reviewed and stable  Post vital signs: Reviewed and stable  Last Vitals:  Vitals Value Taken Time  BP    Temp    Pulse 72 05/27/22 1158  Resp    SpO2 100 % 05/27/22 1158  Vitals shown include unvalidated device data.  Last Pain:  Vitals:   05/27/22 0813  TempSrc:   PainSc: 0-No pain         Complications: No notable events documented.

## 2022-05-27 NOTE — H&P (Signed)
Amanda West is an 76 y.o. female.   Chief Complaint: Neck pain HPI: 76 year old female status post prior C4-C7 anterior cervical discectomy and fusion presents with increasing neck pain with bilateral upper extremity numbness paresthesias and some weakness.  Patient is also noted progressive gait instability.  Work-up demonstrates evidence of severe stenosis at C3-4 secondary to a central disc herniation with marked cord compression.  Patient presents now for C3-4 anterior cervical decompression and fusion in hopes of improving her symptoms.  Past Medical History:  Diagnosis Date   Abnormality of gait    previous was seen by neurology-- dr c. Anne Hahn--- relased lov 08-31-2019, much improved per pt   Arthritis    Chronic constipation    Cornea scar    right eye   Eyes sensitive to light, bilateral    per pt right > left due to residual shingles    IDA (iron deficiency anemia)    Neuromuscular disorder (HCC)    Neuropathy   Osteoporosis    Spinal stenosis in cervical region 09/15/2014   Subcutaneous mass    bilateral abdominal    Vitamin D deficiency    Wears glasses     Past Surgical History:  Procedure Laterality Date   ABDOMINAL HERNIA REPAIR  1980s   ANTERIOR CERVICAL DECOMP/DISCECTOMY FUSION N/A 09/21/2017   Procedure: Anterior Cervical Decompression Fusion Cervical Four-Five,Cervical Five-Six,Cervical Six-Seven;  Surgeon: Julio Sicks, MD;  Location: [facility] OR;  Service: Neurosurgery;  Laterality: N/A;  anterior    CATARACT EXTRACTION W/ INTRAOCULAR LENS  IMPLANT, BILATERAL  1990's   MASS EXCISION N/A 12/19/2020   Procedure: EXCISION OF RIGHT LOWER ABDOMEN SUBCUTANEOUS MASS, EXCISION OF LEFT LOWER ABDOMEN SUBCUTANEOUS MASS;  Surgeon: Quentin Ore, MD;  Location: [facility];  Service: General;  Laterality: N/A;   PARS PLANA VITRECTOMY  07/06/2012   Procedure: PARS PLANA VITRECTOMY WITH 25G REMOVAL/SUTURE INTRAOCULAR LENS;  Surgeon: Sherrie George, MD;   Location: [facility] OR;  Service: Ophthalmology;  Laterality: Left;   TUBAL LIGATION     VITRECTOMY AND CATARACT Right ?   IOL replaced    Family History  Problem Relation Age of Onset   Heart disease Mother    Heart disease Father    Diabetes Sister    Cancer Brother    Heart disease Brother    Social History:  reports that she quit smoking about 8 years ago. Her smoking use included cigarettes. She has never used smokeless tobacco. She reports that she does not drink alcohol and does not use drugs.  Allergies:  Allergies  Allergen Reactions   Codeine Nausea Only   Pamelor [Nortriptyline]     Blurred vision    Medications Prior to Admission  Medication Sig Dispense Refill   APPLE CIDER VINEGAR PO Take 500 mg by mouth every evening.     Calcium-Magnesium-Zinc (CAL-MAG-ZINC PO) Take 1 tablet by mouth in the morning.     Capsicum, Cayenne, (CAYENNE PEPPER PO) Take 455 mg by mouth every evening.     carboxymethylcellulose (REFRESH PLUS) 0.5 % SOLN Place 1 drop into both eyes 3 (three) times daily as needed.     Cholecalciferol (VITAMIN D) 50 MCG (2000 UT) tablet Take 2,000 Units by mouth daily.     Cinnamon 500 MG capsule Take 1,000 mg by mouth in the morning.     COLLAGEN PO Take 500 mg by mouth every evening.     DULoxetine (CYMBALTA) 20 MG capsule TAKE 1 CAPSULE BY MOUTH ONCE DAILY (  Patient taking differently: Take 20 mg by mouth every other day. At bedtime.) 90 capsule 3   GREEN TEA, CAMELLIA SINENSIS, PO Take 1 capsule by mouth in the morning.     MELATONIN PO Take 25 mg by mouth at bedtime as needed (sleep).     Pediatric Multivitamins-Iron (FLINTSTONES PLUS IRON PO) Take 1 tablet by mouth in the morning.     Potassium 99 MG TABS Take 99 mg by mouth every evening.     senna (SENOKOT) 8.6 MG tablet Take 4 tablets by mouth at bedtime.     TURMERIC PO Take 1 capsule by mouth daily.     vitamin B-12 (CYANOCOBALAMIN) 1000 MCG tablet Take 1,000 mcg by mouth in the morning.     vitamin  C (ASCORBIC ACID) 500 MG tablet Take 500 mg by mouth in the morning.     zinc gluconate 50 MG tablet Take 50 mg by mouth daily.     aspirin EC 81 MG tablet Take 81 mg by mouth daily. Swallow whole.      Results for orders placed or performed during the hospital encounter of 05/27/22 (from the past 48 hour(s))  CBC     Status: Abnormal   Collection Time: 05/27/22  7:36 AM  Result Value Ref Range   WBC 3.8 (L) 4.0 - 10.5 K/uL   RBC 3.66 (L) 3.87 - 5.11 MIL/uL   Hemoglobin 12.3 12.0 - 15.0 g/dL   HCT 07.3 71.0 - 62.6 %   MCV 100.8 (H) 80.0 - 100.0 fL   MCH 33.6 26.0 - 34.0 pg   MCHC 33.3 30.0 - 36.0 g/dL   RDW 94.8 54.6 - 27.0 %   Platelets 226 150 - 400 K/uL   nRBC 0.0 0.0 - 0.2 %    Comment: Performed at [facility], [address], [place], [place] [address]  Basic metabolic panel     Status: None   Collection Time: 05/27/22  7:36 AM  Result Value Ref Range   Sodium 138 135 - 145 mmol/L   Potassium 3.7 3.5 - 5.1 mmol/L   Chloride 105 98 - 111 mmol/L   CO2 24 22 - 32 mmol/L   Glucose, Bld 87 70 - 99 mg/dL    Comment: Glucose reference range applies only to samples taken after fasting for at least 8 hours.   BUN 8 8 - 23 mg/dL   Creatinine, Ser 3.81 0.44 - 1.00 mg/dL   Calcium 9.6 8.9 - 82.9 mg/dL   GFR, Estimated >93 >71 mL/min    Comment: (NOTE) Calculated using the CKD-EPI Creatinine Equation (2021)    Anion gap 9 5 - 15    Comment: Performed at [facility], [address], [place], [place] [address]   No results found.  Pertinent items noted in HPI and remainder of comprehensive ROS otherwise negative.  Blood pressure 114/65, pulse 65, temperature 98.1 F (36.7 C), temperature source Oral, resp. rate (!) 98, height 5\' 2"  (1.575 m), weight 52.2 kg, SpO2 97 %.  Patient is awake and alert.  She is oriented and appropriate.  Speech is fluent.  Judgment insight are intact.  Cranial nerve function normal bilaterally.  Motor examination reveals some mild  weakness of grip strength and intrinsics bilaterally.  Lower extremity tone is slightly increased.  Sensory examination with patchy distal sensory loss in both upper and lower extremities.  Reflexes are brisk.  Gait is unsteady.  Examination head ears eyes nose throat some are.  Chest and abdomen are benign.  Extremities are free of major deformity. Assessment/Plan Cervical stenosis with myelopathy.  Plan C3-4 anterior cervical discectomy with interbody fusion utilizing interbody cage, local harvested autograft, and anterior plate instrumentation.  Risks and benefits been explained.  Patient wishes to proceed.  Anthony Tamburo A Wilna Pennie 05/27/2022, 10:00 AM

## 2022-05-27 NOTE — Brief Op Note (Signed)
05/27/2022  11:47 AM  PATIENT:  Jeanette Caprice  76 y.o. female  PRE-OPERATIVE DIAGNOSIS:  Stenosis  POST-OPERATIVE DIAGNOSIS:  Stenosis  PROCEDURE:  Procedure(s) with comments: CERVICAL THREE-FOUR ANTERIOR CERVICAL DECOMPRESSION/DISCECTOMY FUSION (N/A) - 3C  SURGEON:  Surgeon(s) and Role:    * Julio Sicks, MD - Primary  PHYSICIAN ASSISTANT:   ASSISTANTSMarland Mcalpine   ANESTHESIA:   general  EBL:  50 mL   BLOOD ADMINISTERED:none  DRAINS: none   LOCAL MEDICATIONS USED:  NONE  SPECIMEN:  No Specimen  DISPOSITION OF SPECIMEN:  N/A  COUNTS:  YES  TOURNIQUET:  * No tourniquets in log *  DICTATION: .Dragon Dictation  PLAN OF CARE: Admit for overnight observation  PATIENT DISPOSITION:  PACU - hemodynamically stable.   Delay start of Pharmacological VTE agent (>24hrs) due to surgical blood loss or risk of bleeding: yes

## 2022-05-27 NOTE — Anesthesia Postprocedure Evaluation (Signed)
Anesthesia Post Note  Patient: Amanda West  Procedure(s) Performed: CERVICAL THREE-FOUR ANTERIOR CERVICAL DECOMPRESSION/DISCECTOMY FUSION (Spine Cervical)     Patient location during evaluation: PACU Anesthesia Type: General Level of consciousness: awake and alert Pain management: pain level controlled Vital Signs Assessment: post-procedure vital signs reviewed and stable Respiratory status: spontaneous breathing, nonlabored ventilation, respiratory function stable and patient connected to nasal cannula oxygen Cardiovascular status: blood pressure returned to baseline and stable Postop Assessment: no apparent nausea or vomiting Anesthetic complications: no   No notable events documented.  Last Vitals:  Vitals:   05/27/22 1300 05/27/22 1327  BP: (!) 132/53 (!) 137/59  Pulse: 64 63  Resp: 20 16  Temp: (!) 36.2 C 36.6 C  SpO2: 100% 98%    Last Pain:  Vitals:   05/27/22 1327  TempSrc: Oral  PainSc:                  [name] [name]

## 2022-05-28 ENCOUNTER — Encounter (HOSPITAL_COMMUNITY): Payer: Self-pay | Admitting: Neurosurgery

## 2022-05-28 DIAGNOSIS — Z87891 Personal history of nicotine dependence: Secondary | ICD-10-CM | POA: Diagnosis not present

## 2022-05-28 DIAGNOSIS — Z7982 Long term (current) use of aspirin: Secondary | ICD-10-CM | POA: Diagnosis not present

## 2022-05-28 DIAGNOSIS — Z79899 Other long term (current) drug therapy: Secondary | ICD-10-CM | POA: Diagnosis not present

## 2022-05-28 DIAGNOSIS — M4802 Spinal stenosis, cervical region: Secondary | ICD-10-CM | POA: Diagnosis not present

## 2022-05-28 DIAGNOSIS — G959 Disease of spinal cord, unspecified: Secondary | ICD-10-CM | POA: Diagnosis not present

## 2022-05-28 MED ORDER — HYDROCODONE-ACETAMINOPHEN 5-325 MG PO TABS: 1.0000 | ORAL_TABLET | ORAL | 0 refills | Status: DC | PRN

## 2022-05-28 MED ORDER — METHOCARBAMOL 500 MG PO TABS: 500.0000 mg | ORAL_TABLET | Freq: Four times a day (QID) | ORAL | 1 refills | Status: DC | PRN

## 2022-05-28 MED FILL — Thrombin For Soln 5000 Unit: CUTANEOUS | Qty: 5000 | Status: AC

## 2022-05-28 NOTE — Discharge Summary (Signed)
Physician Discharge Summary  Patient ID: Amanda West MRN: [id number] DOB/AGE: 1946/03/19 76 y.o.  Admit date: 05/27/2022 Discharge date: 05/28/2022  Admission Diagnoses:  Discharge Diagnoses:  Principal Problem:   Cervical myelopathy [facility])   Discharged Condition: good  Hospital Course: Patient admitted to the hospital where she underwent uncomplicated C3-4 anterior cervical decompression and fusion for treatment of her severe stenosis.  Postoperative doing well.  Preoperative neck and upper extremity symptoms much improved.  Standing ambulating and voiding better.  Neck soft.  Ready for discharge home.  Consults:   Significant Diagnostic Studies:   Treatments:   Discharge Exam: Blood pressure 113/60, pulse 65, temperature 98.6 F (37 C), temperature source Oral, resp. rate 16, height 5\' 2"  (1.575 m), weight 52.2 kg, SpO2 100 %. Awake and alert.  Oriented and appropriate.  Motor and sensory function intact.  Wound clean dry and intact.  Chest and abdomen benign.  Disposition: Discharge disposition: 01-Home or Self Care        Allergies as of 05/28/2022       Reactions   Codeine Nausea Only   Pamelor [nortriptyline]    Blurred vision        Medication List     TAKE these medications    APPLE CIDER VINEGAR PO Take 500 mg by mouth every evening.   aspirin EC 81 MG tablet Take 81 mg by mouth daily. Swallow whole.   CAL-MAG-ZINC PO Take 1 tablet by mouth in the morning.   carboxymethylcellulose 0.5 % Soln Commonly known as: REFRESH PLUS Place 1 drop into both eyes 3 (three) times daily as needed.   CAYENNE PEPPER PO Take 455 mg by mouth every evening.   Cinnamon 500 MG capsule Take 1,000 mg by mouth in the morning.   COLLAGEN PO Take 500 mg by mouth every evening.   DULoxetine 20 MG capsule Commonly known as: CYMBALTA TAKE 1 CAPSULE BY MOUTH ONCE DAILY What changed:  when to take this additional instructions   FLINTSTONES PLUS IRON PO Take 1  tablet by mouth in the morning.   GREEN TEA (CAMELLIA SINENSIS) PO Take 1 capsule by mouth in the morning.   HYDROcodone-acetaminophen 5-325 MG tablet Commonly known as: NORCO/VICODIN Take 1 tablet by mouth every 4 (four) hours as needed for moderate pain ((score 4 to 6)).   MELATONIN PO Take 25 mg by mouth at bedtime as needed (sleep).   methocarbamol 500 MG tablet Commonly known as: ROBAXIN Take 1 tablet (500 mg total) by mouth every 6 (six) hours as needed for muscle spasms.   Potassium 99 MG Tabs Take 99 mg by mouth every evening.   senna 8.6 MG tablet Commonly known as: SENOKOT Take 4 tablets by mouth at bedtime.   TURMERIC PO Take 1 capsule by mouth daily.   vitamin B-12 1000 MCG tablet Commonly known as: CYANOCOBALAMIN Take 1,000 mcg by mouth in the morning.   vitamin C 500 MG tablet Commonly known as: ASCORBIC ACID Take 500 mg by mouth in the morning.   Vitamin D 50 MCG (2000 UT) tablet Take 2,000 Units by mouth daily.   zinc gluconate 50 MG tablet Take 50 mg by mouth daily.         Signed: 05/30/2022 Darrell Leonhardt 05/28/2022, 10:10 AM

## 2022-05-28 NOTE — Plan of Care (Signed)

## 2022-05-28 NOTE — Evaluation (Signed)
Occupational Therapy Evaluation Patient Details Name: Amanda West MRN: [id number] DOB: 08/26/1946 Today's Date: 05/28/2022   History of Present Illness Amanda West is a 76 yo female who underwent C3-4 anterior cervical discectomy with interbody fusion 6/20. PMHx: arthritis, Cornea scar, bilateral eye light sensitivity, osteoporosis   Clinical Impression   Amanda West was evaluated s/p the above admission list, she is generally indep at baseline including working part time and driving. She lives with her husband who can assist as needed at d/c. After review of cervical precautions and compensatory techniques, she was able to complete functional mobility and ADLs with supervision A for safety only; no physical assist or LOB noted throughout. She does not have further OT needs. Recommend d/c home with support from husband.      Recommendations for follow up therapy are one component of a multi-disciplinary discharge planning process, led by the attending physician.  Recommendations may be updated based on patient status, additional functional criteria and insurance authorization.   Follow Up Recommendations  No OT follow up    Assistance Recommended at Discharge Intermittent Supervision/Assistance  Patient can return home with the following A little help with walking and/or transfers;A little help with bathing/dressing/bathroom;Help with stairs or ramp for entrance;Assist for transportation    Functional Status Assessment  Patient has had a recent decline in their functional status and demonstrates the ability to make significant improvements in function in a reasonable and predictable amount of time.  Equipment Recommendations  None recommended by OT    Recommendations for Other Services       Precautions / Restrictions Precautions Precautions: Fall;Cervical Precaution Booklet Issued: Yes (comment) Required Braces or Orthoses: Cervical Brace Cervical Brace: Soft  collar Restrictions Weight Bearing Restrictions: No      Mobility Bed Mobility Overal bed mobility: Needs Assistance Bed Mobility: Rolling, Sidelying to Sit Rolling: Supervision Sidelying to sit: Supervision       General bed mobility comments: no physical assist - cue for log roll    Transfers Overall transfer level: Needs assistance   Transfers: Sit to/from Stand Sit to Stand: Supervision           General transfer comment: no AD - no physical assist      Balance Overall balance assessment: Needs assistance Sitting-balance support: Feet supported Sitting balance-Leahy Scale: Fair     Standing balance support: No upper extremity supported, During functional activity Standing balance-Leahy Scale: Fair Standing balance comment: able to stand at the sink to groom without UE support or LOB                           ADL either performed or assessed with clinical judgement   ADL Overall ADL's : Needs assistance/impaired                                     Functional mobility during ADLs: Supervision/safety General ADL Comments: Supervision provided throughout but demonstrated mod I for mobility and ADLs after review of back precautions and compensatory techniques     Vision Baseline Vision/History: 1 Wears glasses Ability to See in Adequate Light: 0 Adequate Patient Visual Report: No change from baseline Additional Comments: overall WFL. pt has bilat light sensitivity.     Perception     Praxis      Pertinent Vitals/Pain Pain Assessment Pain Assessment: Faces Faces Pain Scale: Hurts a little bit  Pain Location: neck Pain Descriptors / Indicators: Discomfort Pain Intervention(s): Limited activity within patient's tolerance, Monitored during session     Hand Dominance Right   Extremity/Trunk Assessment Upper Extremity Assessment Upper Extremity Assessment: Overall WFL for tasks assessed (pt states she has intermittent  tingling, not present this session)   Lower Extremity Assessment Lower Extremity Assessment: Overall WFL for tasks assessed   Cervical / Trunk Assessment Cervical / Trunk Assessment: Neck Surgery   Communication Communication Communication: No difficulties   Cognition Arousal/Alertness: Awake/alert Behavior During Therapy: WFL for tasks assessed/performed Overall Cognitive Status: Within Functional Limits for tasks assessed                                       General Comments  VSS on RA            Home Living Family/patient expects to be discharged to:: Private residence Living Arrangements: Spouse/significant other Available Help at Discharge: Family;Available 24 hours/day Type of Home: House Home Access: Stairs to enter [employer] of Steps: 1 Entrance Stairs-Rails: None Home Layout: One level     Bathroom Shower/Tub: Producer, television/film/video: Handicapped height     Home Equipment: Agricultural consultant (2 wheels);Cane - single point;Crutches;Shower seat          Prior Functioning/Environment Prior Level of Function : Independent/Modified Independent;Driving;Working/employed             Mobility Comments: No AD, husband assists for fall prevention as needed ADLs Comments: Works part time, drives        OT Problem List: Decreased range of motion;Decreased activity tolerance;Impaired balance (sitting and/or standing);Pain;Decreased knowledge of precautions      OT Treatment/Interventions:      OT Goals(Current goals can be found in the care plan section) Acute Rehab OT Goals Patient Stated Goal: home OT Goal Formulation: With patient Time For Goal Achievement: 05/28/22 Potential to Achieve Goals: Good   AM-PAC OT "6 Clicks" Daily Activity     Outcome Measure Help from another person eating meals?: None Help from another person taking care of personal grooming?: None Help from another person toileting, which includes  using toliet, bedpan, or urinal?: A Little Help from another person bathing (including washing, rinsing, drying)?: A Little Help from another person to put on and taking off regular upper body clothing?: None Help from another person to put on and taking off regular lower body clothing?: A Little 6 Click Score: 21   End of Session Equipment Utilized During Treatment: Cervical collar Nurse Communication: Mobility status (pt asking to stay another day, encouraged d/c today)  Activity Tolerance: Patient tolerated treatment well Patient left: in bed;with call bell/phone within reach  OT Visit Diagnosis: Unsteadiness on feet (R26.81);Muscle weakness (generalized) (M62.81)                Time: [id number] OT Time Calculation (min): 23 min Charges:  OT General Charges $OT Visit: 1 Visit OT Evaluation $OT Eval Moderate Complexity: 1 Mod OT Treatments $Self Care/Home Management : 8-22 mins   Mirtha Jain A Iriel Nason 05/28/2022, 11:17 AM

## 2022-05-28 NOTE — Progress Notes (Signed)
Patient transported to her vehicle via wheelchair by volunteer for discharge home; in no acute distress nor complaints of pain nor discomfort; moves all extremities well; incision on her anterior neck with gauze dressing and is clean, dry and intact with a soft collar on; room was checked for all her belongings; discharge instructions concerning her medications, incision care, follow up appointment and when to call the doctor as needed were all discussed with patient by RN and she expressed understanding on the instructions given.

## 2022-05-28 NOTE — Discharge Instructions (Signed)

## 2022-07-08 DIAGNOSIS — M4802 Spinal stenosis, cervical region: Secondary | ICD-10-CM | POA: Diagnosis not present

## 2022-07-09 DIAGNOSIS — H16141 Punctate keratitis, right eye: Secondary | ICD-10-CM | POA: Diagnosis not present

## 2022-08-21 DIAGNOSIS — M4802 Spinal stenosis, cervical region: Secondary | ICD-10-CM | POA: Diagnosis not present

## 2022-09-09 DIAGNOSIS — L570 Actinic keratosis: Secondary | ICD-10-CM | POA: Diagnosis not present

## 2022-09-09 DIAGNOSIS — Z23 Encounter for immunization: Secondary | ICD-10-CM | POA: Diagnosis not present

## 2022-09-09 DIAGNOSIS — B029 Zoster without complications: Secondary | ICD-10-CM | POA: Diagnosis not present

## 2022-09-09 DIAGNOSIS — B079 Viral wart, unspecified: Secondary | ICD-10-CM | POA: Diagnosis not present

## 2022-10-28 DIAGNOSIS — Z136 Encounter for screening for cardiovascular disorders: Secondary | ICD-10-CM | POA: Diagnosis not present

## 2022-10-28 DIAGNOSIS — M810 Age-related osteoporosis without current pathological fracture: Secondary | ICD-10-CM | POA: Diagnosis not present

## 2022-10-28 DIAGNOSIS — M4802 Spinal stenosis, cervical region: Secondary | ICD-10-CM | POA: Diagnosis not present

## 2022-10-28 DIAGNOSIS — Z0000 Encounter for general adult medical examination without abnormal findings: Secondary | ICD-10-CM | POA: Diagnosis not present

## 2022-10-28 DIAGNOSIS — Z79899 Other long term (current) drug therapy: Secondary | ICD-10-CM | POA: Diagnosis not present

## 2022-10-28 DIAGNOSIS — Z13220 Encounter for screening for lipoid disorders: Secondary | ICD-10-CM | POA: Diagnosis not present

## 2022-10-28 DIAGNOSIS — D539 Nutritional anemia, unspecified: Secondary | ICD-10-CM | POA: Diagnosis not present

## 2023-01-06 DIAGNOSIS — H04123 Dry eye syndrome of bilateral lacrimal glands: Secondary | ICD-10-CM | POA: Diagnosis not present

## 2023-01-09 DIAGNOSIS — H16231 Neurotrophic keratoconjunctivitis, right eye: Secondary | ICD-10-CM | POA: Diagnosis not present

## 2023-01-15 DIAGNOSIS — H16231 Neurotrophic keratoconjunctivitis, right eye: Secondary | ICD-10-CM | POA: Diagnosis not present

## 2023-03-18 DIAGNOSIS — R42 Dizziness and giddiness: Secondary | ICD-10-CM | POA: Diagnosis not present

## 2023-03-18 DIAGNOSIS — R8290 Unspecified abnormal findings in urine: Secondary | ICD-10-CM | POA: Diagnosis not present

## 2023-04-08 DIAGNOSIS — J029 Acute pharyngitis, unspecified: Secondary | ICD-10-CM | POA: Diagnosis not present

## 2023-04-08 DIAGNOSIS — J019 Acute sinusitis, unspecified: Secondary | ICD-10-CM | POA: Diagnosis not present

## 2023-04-08 DIAGNOSIS — R059 Cough, unspecified: Secondary | ICD-10-CM | POA: Diagnosis not present

## 2023-05-20 DIAGNOSIS — K602 Anal fissure, unspecified: Secondary | ICD-10-CM | POA: Diagnosis not present

## 2023-06-13 ENCOUNTER — Other Ambulatory Visit: Payer: Self-pay

## 2023-06-13 ENCOUNTER — Emergency Department (HOSPITAL_COMMUNITY): Payer: Medicare Other

## 2023-06-13 ENCOUNTER — Encounter (HOSPITAL_COMMUNITY): Payer: Self-pay

## 2023-06-13 ENCOUNTER — Inpatient Hospital Stay (HOSPITAL_COMMUNITY): Payer: Medicare Other

## 2023-06-13 ENCOUNTER — Inpatient Hospital Stay (HOSPITAL_COMMUNITY)
Admission: EM | Admit: 2023-06-13 | Discharge: 2023-06-18 | DRG: 481 | Disposition: A | Discharge status: Home Health | Payer: Medicare Other | Attending: Internal Medicine | Admitting: Internal Medicine

## 2023-06-13 DIAGNOSIS — W010XXA Fall on same level from slipping, tripping and stumbling without subsequent striking against object, initial encounter: Secondary | ICD-10-CM | POA: Diagnosis present

## 2023-06-13 DIAGNOSIS — Y92008 Other place in unspecified non-institutional (private) residence as the place of occurrence of the external cause: Secondary | ICD-10-CM

## 2023-06-13 DIAGNOSIS — S72141A Displaced intertrochanteric fracture of right femur, initial encounter for closed fracture: Secondary | ICD-10-CM | POA: Diagnosis not present

## 2023-06-13 DIAGNOSIS — Y9301 Activity, walking, marching and hiking: Secondary | ICD-10-CM | POA: Diagnosis present

## 2023-06-13 DIAGNOSIS — Z6820 Body mass index (BMI) 20.0-20.9, adult: Secondary | ICD-10-CM | POA: Diagnosis not present

## 2023-06-13 DIAGNOSIS — Z7982 Long term (current) use of aspirin: Secondary | ICD-10-CM

## 2023-06-13 DIAGNOSIS — Z8249 Family history of ischemic heart disease and other diseases of the circulatory system: Secondary | ICD-10-CM

## 2023-06-13 DIAGNOSIS — D62 Acute posthemorrhagic anemia: Secondary | ICD-10-CM | POA: Diagnosis not present

## 2023-06-13 DIAGNOSIS — Z79899 Other long term (current) drug therapy: Secondary | ICD-10-CM | POA: Diagnosis not present

## 2023-06-13 DIAGNOSIS — M810 Age-related osteoporosis without current pathological fracture: Secondary | ICD-10-CM | POA: Diagnosis present

## 2023-06-13 DIAGNOSIS — Z961 Presence of intraocular lens: Secondary | ICD-10-CM | POA: Diagnosis present

## 2023-06-13 DIAGNOSIS — Z743 Need for continuous supervision: Secondary | ICD-10-CM | POA: Diagnosis not present

## 2023-06-13 DIAGNOSIS — E559 Vitamin D deficiency, unspecified: Secondary | ICD-10-CM | POA: Diagnosis present

## 2023-06-13 DIAGNOSIS — Z9889 Other specified postprocedural states: Secondary | ICD-10-CM | POA: Diagnosis not present

## 2023-06-13 DIAGNOSIS — D539 Nutritional anemia, unspecified: Secondary | ICD-10-CM | POA: Diagnosis not present

## 2023-06-13 DIAGNOSIS — Z01818 Encounter for other preprocedural examination: Secondary | ICD-10-CM | POA: Diagnosis not present

## 2023-06-13 DIAGNOSIS — Z833 Family history of diabetes mellitus: Secondary | ICD-10-CM | POA: Diagnosis not present

## 2023-06-13 DIAGNOSIS — Z87891 Personal history of nicotine dependence: Secondary | ICD-10-CM | POA: Diagnosis not present

## 2023-06-13 DIAGNOSIS — I951 Orthostatic hypotension: Secondary | ICD-10-CM | POA: Diagnosis not present

## 2023-06-13 DIAGNOSIS — R600 Localized edema: Secondary | ICD-10-CM | POA: Diagnosis not present

## 2023-06-13 DIAGNOSIS — I959 Hypotension, unspecified: Secondary | ICD-10-CM | POA: Diagnosis not present

## 2023-06-13 DIAGNOSIS — S72121A Displaced fracture of lesser trochanter of right femur, initial encounter for closed fracture: Secondary | ICD-10-CM | POA: Diagnosis not present

## 2023-06-13 DIAGNOSIS — R9431 Abnormal electrocardiogram [ECG] [EKG]: Secondary | ICD-10-CM | POA: Diagnosis not present

## 2023-06-13 DIAGNOSIS — E440 Moderate protein-calorie malnutrition: Secondary | ICD-10-CM | POA: Diagnosis present

## 2023-06-13 DIAGNOSIS — M160 Bilateral primary osteoarthritis of hip: Secondary | ICD-10-CM | POA: Diagnosis not present

## 2023-06-13 DIAGNOSIS — I10 Essential (primary) hypertension: Secondary | ICD-10-CM | POA: Diagnosis not present

## 2023-06-13 DIAGNOSIS — R110 Nausea: Secondary | ICD-10-CM | POA: Diagnosis not present

## 2023-06-13 DIAGNOSIS — S72001A Fracture of unspecified part of neck of right femur, initial encounter for closed fracture: Secondary | ICD-10-CM | POA: Diagnosis not present

## 2023-06-13 LAB — CBC WITH DIFFERENTIAL/PLATELET
Abs Immature Granulocytes: 0.01 10*3/uL (ref 0.00–0.07)
Basophils Absolute: 0 10*3/uL (ref 0.0–0.1)
Basophils Relative: 1 %
Eosinophils Absolute: 0.1 10*3/uL (ref 0.0–0.5)
Eosinophils Relative: 2 %
HCT: 32.7 % — ABNORMAL LOW (ref 36.0–46.0)
Hemoglobin: 10.8 g/dL — ABNORMAL LOW (ref 12.0–15.0)
Immature Granulocytes: 0 %
Lymphocytes Relative: 33 %
Lymphs Abs: 1.2 10*3/uL (ref 0.7–4.0)
MCH: 33.9 pg (ref 26.0–34.0)
MCHC: 33 g/dL (ref 30.0–36.0)
MCV: 102.5 fL — ABNORMAL HIGH (ref 80.0–100.0)
Monocytes Absolute: 0.3 10*3/uL (ref 0.1–1.0)
Monocytes Relative: 7 %
Neutro Abs: 2.2 10*3/uL (ref 1.7–7.7)
Neutrophils Relative %: 57 %
Platelets: 185 10*3/uL (ref 150–400)
RBC: 3.19 MIL/uL — ABNORMAL LOW (ref 3.87–5.11)
RDW: 12.8 % (ref 11.5–15.5)
WBC: 3.7 10*3/uL — ABNORMAL LOW (ref 4.0–10.5)
nRBC: 0 % (ref 0.0–0.2)

## 2023-06-13 LAB — BASIC METABOLIC PANEL
Anion gap: 8 (ref 5–15)
BUN: 11 mg/dL (ref 8–23)
CO2: 26 mmol/L (ref 22–32)
Calcium: 8.6 mg/dL — ABNORMAL LOW (ref 8.9–10.3)
Chloride: 104 mmol/L (ref 98–111)
Creatinine, Ser: 0.66 mg/dL (ref 0.44–1.00)
GFR, Estimated: >60 mL/min (ref >60)
Glucose, Bld: 116 mg/dL — ABNORMAL HIGH (ref 70–99)
Potassium: 3.8 mmol/L (ref 3.5–5.1)
Sodium: 138 mmol/L (ref 135–145)

## 2023-06-13 LAB — TYPE AND SCREEN
ABO/RH(D): A POS
Antibody Screen: NEGATIVE

## 2023-06-13 LAB — ABO/RH: ABO/RH(D): A POS

## 2023-06-13 LAB — PROTIME-INR
INR: 1.1 (ref 0.8–1.2)
Prothrombin Time: 14.4 seconds (ref 11.4–15.2)

## 2023-06-13 MED ORDER — HYDROMORPHONE HCL 1 MG/ML IJ SOLN
0.5000 mg | INTRAMUSCULAR | Status: DC | PRN
  Administered 2023-06-13: 0.5 mg via INTRAVENOUS
  Filled 2023-06-13: qty 1

## 2023-06-13 MED ORDER — ACETAMINOPHEN 325 MG PO TABS: 650.0000 mg | ORAL_TABLET | Freq: Four times a day (QID) | ORAL | Status: DC | PRN

## 2023-06-13 MED ORDER — MORPHINE SULFATE (PF) 2 MG/ML IV SOLN: 0.5000 mg | INTRAVENOUS | Status: DC | PRN

## 2023-06-13 MED ORDER — SENNOSIDES-DOCUSATE SODIUM 8.6-50 MG PO TABS: 1.0000 | ORAL_TABLET | Freq: Every evening | ORAL | Status: DC | PRN

## 2023-06-13 MED ORDER — HYDROCODONE-ACETAMINOPHEN 5-325 MG PO TABS: 1.0000 | ORAL_TABLET | Freq: Four times a day (QID) | ORAL | Status: DC | PRN

## 2023-06-13 MED ORDER — ONDANSETRON HCL 4 MG/2ML IJ SOLN: 4.0000 mg | Freq: Four times a day (QID) | INTRAMUSCULAR | Status: DC | PRN

## 2023-06-13 MED ORDER — SODIUM CHLORIDE 0.9 % IV SOLN: INTRAVENOUS | Status: DC

## 2023-06-13 NOTE — Progress Notes (Signed)
Orthopedic Tech Progress Note Patient Details:  Amanda West January 21, 1946 [id number]  Patient ID: Amanda West, female   DOB: 11/20/1946, 77 y.o.   MRN: [id number] Patient doesn't meet criteria for ohf. Patient must be under 70 to get ohf. [name] Post 06/13/2023, 11:29 PM

## 2023-06-13 NOTE — Progress Notes (Signed)
Orthopedic Tech Progress Note Patient Details:  Amanda West 03/27/1946 [id number]  Musculoskeletal Traction Type of Traction: Bucks Skin Traction Traction Location: rle Traction Weight: 10 lbs   Post Interventions Patient Tolerated: Well Instructions Provided: Adjustment of device, Care of device  [name] Post 06/13/2023, 10:29 PM

## 2023-06-13 NOTE — ED Provider Notes (Signed)
[place] EMERGENCY DEPARTMENT AT [facility] Provider Note   CSN: [id number] Arrival date & time: 06/13/23  1935     History  Chief Complaint  Patient presents with   Amanda West    Amanda West is a 77 y.o. female.  HPI 78 year old female presents with a fall and right hip pain. She was walking and her shoe stuck to the floor, causing her to fall. Landed on right hip. No head injury or LOC. Has been having cramping and pain to the right leg.  Has received 200 mcg of fentanyl (over multiple doses) via EMS.  No numbness or weakness.  Home Medications Prior to Admission medications   Medication Sig Start Date End Date Taking? Authorizing Provider  Cholecalciferol (VITAMIN D) 50 MCG (2000 UT) tablet Take 2,000 Units by mouth daily.   Yes [provider]  DULoxetine (CYMBALTA) 20 MG capsule TAKE 1 CAPSULE BY MOUTH ONCE DAILY Patient taking differently: Take 20 mg by mouth every other day. At bedtime. 10/07/18  Yes Millikan, Megan, NP  GREEN TEA, CAMELLIA SINENSIS, PO Take 1 capsule by mouth in the morning.   Yes [provider]  Potassium 99 MG TABS Take 99 mg by mouth every evening.   Yes [provider]  senna (SENOKOT) 8.6 MG tablet Take 4 tablets by mouth at bedtime.   Yes [provider]  APPLE CIDER VINEGAR PO Take 500 mg by mouth every evening.    [provider]  aspirin EC 81 MG tablet Take 81 mg by mouth daily. Swallow whole.    [provider]  Calcium-Magnesium-Zinc (CAL-MAG-ZINC PO) Take 1 tablet by mouth in the morning.    [provider]  Capsicum, Cayenne, (CAYENNE PEPPER PO) Take 455 mg by mouth every evening.    [provider]  carboxymethylcellulose (REFRESH PLUS) 0.5 % SOLN Place 1 drop into both eyes 3 (three) times daily as needed.    [provider]  Cinnamon 500 MG capsule Take 1,000 mg by mouth in the morning.    [provider]  COLLAGEN PO Take 500 mg by mouth  every evening.    [provider]  HYDROcodone-acetaminophen (NORCO/VICODIN) 5-325 MG tablet Take 1 tablet by mouth every 4 (four) hours as needed for moderate pain ((score 4 to 6)). 05/28/22   Julio Sicks, MD  MELATONIN PO Take 25 mg by mouth at bedtime as needed (sleep).    [provider]  methocarbamol (ROBAXIN) 500 MG tablet Take 1 tablet (500 mg total) by mouth every 6 (six) hours as needed for muscle spasms. 05/28/22   Julio Sicks, MD  Pediatric Multivitamins-Iron (FLINTSTONES PLUS IRON PO) Take 1 tablet by mouth in the morning.    [provider]  TURMERIC PO Take 1 capsule by mouth daily.    [provider]  vitamin B-12 (CYANOCOBALAMIN) 1000 MCG tablet Take 1,000 mcg by mouth in the morning.    [provider]  vitamin C (ASCORBIC ACID) 500 MG tablet Take 500 mg by mouth in the morning.    [provider]  zinc gluconate 50 MG tablet Take 50 mg by mouth daily.    [provider]      Allergies    Codeine and Pamelor [nortriptyline]    Review of Systems   Review of Systems  Musculoskeletal:  Positive for arthralgias.  Neurological:  Negative for syncope and headaches.    Physical Exam Updated Vital Signs BP 118/67 (BP Location: Right Arm)  Pulse 64   Temp (!) 97.5 F (36.4 C) (Oral)   Resp 15   Ht 5' 2.5" (1.588 m)   Wt 51.7 kg   SpO2 96%   BMI 20.52 kg/m  Physical Exam Vitals and nursing note reviewed.  Constitutional:      Appearance: She is well-developed.  HENT:     Head: Normocephalic and atraumatic.  Cardiovascular:     Rate and Rhythm: Normal rate and regular rhythm.     Pulses:          Dorsalis pedis pulses are 2+ on the right side.     Heart sounds: Normal heart sounds.  Pulmonary:     Effort: Pulmonary effort is normal.  Abdominal:     General: There is no distension.  Musculoskeletal:     Right hip: Tenderness present. Decreased range of motion.     Right knee: No tenderness.      Comments: Normal strength and sensation in R foot  Skin:    General: Skin is warm and dry.  Neurological:     Mental Status: She is alert.     ED Results / Procedures / Treatments   Labs (all labs ordered are listed, but only abnormal results are displayed) Labs Reviewed  BASIC METABOLIC PANEL - Abnormal; Notable for the following components:      Result Value   Glucose, Bld 116 (*)    Calcium 8.6 (*)    All other components within normal limits  CBC WITH DIFFERENTIAL/PLATELET - Abnormal; Notable for the following components:   WBC 3.7 (*)    RBC 3.19 (*)    Hemoglobin 10.8 (*)    HCT 32.7 (*)    MCV 102.5 (*)    All other components within normal limits  PROTIME-INR  CBC  BASIC METABOLIC PANEL  VITAMIN D 25 HYDROXY (VIT D DEFICIENCY, FRACTURES)  FOLATE  VITAMIN B12  TYPE AND SCREEN  ABO/RH    EKG None  Radiology Chest Portable 1 View  Result Date: 06/13/2023 CLINICAL DATA:  PREOP EXAM: PORTABLE CHEST 1 VIEW COMPARISON:  12/20/12 FINDINGS: The heart size and mediastinal contours are within normal limits. Both lungs are clear. The visualized skeletal structures are unremarkable. IMPRESSION: No active disease. Electronically Signed   By: Deatra Robinson M.D.   On: 06/13/2023 22:14   DG Hip Unilat With Pelvis 2-3 Views Right  Result Date: 06/13/2023 CLINICAL DATA:  Status post fall. EXAM: DG HIP (WITH OR WITHOUT PELVIS) 2-3V RIGHT COMPARISON:  None Available. FINDINGS: There is an acute, comminuted fracture deformity extending through the inter trochanteric region of the proximal right femur. There is no evidence of dislocation. Moderate severity degenerative changes seen involving both hips, in the form of joint space narrowing and acetabular sclerosis. IMPRESSION: Acute, comminuted intertrochanteric fracture of the proximal right femur. Electronically Signed   By: Aram Candela M.D.   On: 06/13/2023 21:20    Procedures Procedures    Medications Ordered in  ED Medications  HYDROmorphone (DILAUDID) injection 0.5 mg (0.5 mg Intravenous Given 06/13/23 2214)  HYDROcodone-acetaminophen (NORCO/VICODIN) 5-325 MG per tablet 1-2 tablet (has no administration in time range)  morphine (PF) 2 MG/ML injection 0.5 mg (has no administration in time range)  acetaminophen (TYLENOL) tablet 650 mg (has no administration in time range)  senna-docusate (Senokot-S) tablet 1 tablet (has no administration in time range)  ondansetron (ZOFRAN) injection 4 mg (has no administration in time range)  0.9 %  sodium chloride infusion (has no administration in  time range)    ED Course/ Medical Decision Making/ A&P                             Medical Decision Making Amount and/or Complexity of Data Reviewed Labs: ordered.    Details: Chronic anemia Radiology: ordered and independent interpretation performed.    Details: Right hip/femur fracture. ECG/medicine tests: ordered and independent interpretation performed.    Details: Nonspecific T waves  Risk Prescription drug management. Decision regarding hospitalization.   Patient presents with a closed right intertrochanteric femur fracture.  Neurovascular intact.  Pain seems to be well-controlled with IV narcotics here.  Discussed with Dr. Jena Gauss, will try to take to the OR tomorrow.  Will leave n.p.o. at midnight.  Otherwise, will need medical admission and I have discussed with Dr. Allena Katz, who will admit.        Final Clinical Impression(s) / ED Diagnoses Final diagnoses:  Closed displaced intertrochanteric fracture of right femur, initial encounter [facility])    Rx / DC Orders ED Discharge Orders     None         Pricilla Loveless, MD 06/13/23 2220

## 2023-06-13 NOTE — ED Triage Notes (Addendum)
Pt arrived from home via [facility] s/p mechanical fall landing on right hip. Pt c/o right hip pain 10/10 with movement and states that she is having spasms. Pt Denies blood thinners. Pt A &O x 4. 200 fentanyl and 4 zofran enroute

## 2023-06-13 NOTE — Assessment & Plan Note (Signed)
Occurring after mechanical fall.  Orthopedics consulted, tentative plan for surgical fixation 7/7 pending or availability.  Patient is considered a low risk perioperative surgical candidate. -N.p.o. after midnight -Continue analgesics as needed -Surgical fixation per orthopedic team

## 2023-06-13 NOTE — H&P (Signed)
History and Physical    Amanda West ZOX:[id number] DOB: 1946/03/09 DOA: 06/13/2023  PCP: Lupita Raider, MD  Patient coming from: Home  I have personally briefly reviewed patient's old medical records in [facility]  Chief Complaint: Right hip pain after a fall  HPI: Amanda West is a 77 y.o. female with medical history significant for cervical spinal stenosis s/p C3-4 ACDF who presented to the ED for evaluation of right hip pain after a fall.  Patient states she was walking in her basement when her shoe caught the ground and she tripped.  She fell onto her right side onto the concrete tile floor.  She had immediate severe pain and was unable to stand on her own.  She did not hit her head or lose consciousness.  She had no associated lightheadedness, dizziness, chest pain, dyspnea.  ED Course  Labs/Imaging on admission: I have personally reviewed following labs and imaging studies.  Initial vitals showed BP 118/67, pulse 65, RR 15, temp 97.5 F, SpO2 96% on room air.  Labs show WBC 3.7, hemoglobin 10.8, platelets 185,000, sodium 138, potassium 3.8, bicarb 26, BUN 11, creatinine 0.66, serum glucose 116.  Right hip x-ray shows acute comminuted intertrochanteric fracture of the proximal right femur.  EDP consulted orthopedics, Dr. Jena Gauss, who recommended medical admission and tentative plan for surgical fixation tomorrow 7/7.  The hospitalist service was consulted to admit for further evaluation and management.  Review of Systems: All systems reviewed and are negative except as documented in history of present illness above.   Past Medical History:  Diagnosis Date   Abnormality of gait    previous was seen by neurology-- dr c. Anne Hahn--- relased lov 08-31-2019, much improved per pt   Arthritis    Chronic constipation    Cornea scar    right eye   Eyes sensitive to light, bilateral    per pt right > left due to residual shingles    IDA (iron deficiency anemia)     Neuromuscular disorder (HCC)    Neuropathy   Osteoporosis    Spinal stenosis in cervical region 09/15/2014   Subcutaneous mass    bilateral abdominal    Vitamin D deficiency    Wears glasses     Past Surgical History:  Procedure Laterality Date   ABDOMINAL HERNIA REPAIR  1980s   ANTERIOR CERVICAL DECOMP/DISCECTOMY FUSION N/A 09/21/2017   Procedure: Anterior Cervical Decompression Fusion Cervical Four-Five,Cervical Five-Six,Cervical Six-Seven;  Surgeon: Julio Sicks, MD;  Location: [facility] OR;  Service: Neurosurgery;  Laterality: N/A;  anterior    ANTERIOR CERVICAL DECOMP/DISCECTOMY FUSION N/A 05/27/2022   Procedure: CERVICAL THREE-FOUR ANTERIOR CERVICAL DECOMPRESSION/DISCECTOMY FUSION;  Surgeon: Julio Sicks, MD;  Location: [facility] OR;  Service: Neurosurgery;  Laterality: N/A;  3C   CATARACT EXTRACTION W/ INTRAOCULAR LENS  IMPLANT, BILATERAL  1990's   MASS EXCISION N/A 12/19/2020   Procedure: EXCISION OF RIGHT LOWER ABDOMEN SUBCUTANEOUS MASS, EXCISION OF LEFT LOWER ABDOMEN SUBCUTANEOUS MASS;  Surgeon: Quentin Ore, MD;  Location: [facility];  Service: General;  Laterality: N/A;   PARS PLANA VITRECTOMY  07/06/2012   Procedure: PARS PLANA VITRECTOMY WITH 25G REMOVAL/SUTURE INTRAOCULAR LENS;  Surgeon: Sherrie George, MD;  Location: [facility] OR;  Service: Ophthalmology;  Laterality: Left;   TUBAL LIGATION     VITRECTOMY AND CATARACT Right ?   IOL replaced    Social History:  reports that she quit smoking about 9 years ago. Her smoking use included cigarettes. She has never  used smokeless tobacco. She reports that she does not drink alcohol and does not use drugs.  Allergies  Allergen Reactions   Codeine Nausea Only   Pamelor [Nortriptyline]     Blurred vision    Family History  Problem Relation Age of Onset   Heart disease Mother    Heart disease Father    Diabetes Sister    Cancer Brother    Heart disease Brother      Prior to Admission medications   Medication Sig  Start Date End Date Taking? Authorizing Provider  APPLE CIDER VINEGAR PO Take 500 mg by mouth every evening.    [provider]  aspirin EC 81 MG tablet Take 81 mg by mouth daily. Swallow whole.    [provider]  Calcium-Magnesium-Zinc (CAL-MAG-ZINC PO) Take 1 tablet by mouth in the morning.    [provider]  Capsicum, Cayenne, (CAYENNE PEPPER PO) Take 455 mg by mouth every evening.    [provider]  carboxymethylcellulose (REFRESH PLUS) 0.5 % SOLN Place 1 drop into both eyes 3 (three) times daily as needed.    [provider]  Cholecalciferol (VITAMIN D) 50 MCG (2000 UT) tablet Take 2,000 Units by mouth daily.    [provider]  Cinnamon 500 MG capsule Take 1,000 mg by mouth in the morning.    [provider]  COLLAGEN PO Take 500 mg by mouth every evening.    [provider]  DULoxetine (CYMBALTA) 20 MG capsule TAKE 1 CAPSULE BY MOUTH ONCE DAILY Patient taking differently: Take 20 mg by mouth every other day. At bedtime. 10/07/18   Butch Penny, NP  GREEN TEA, CAMELLIA SINENSIS, PO Take 1 capsule by mouth in the morning.    [provider]  HYDROcodone-acetaminophen (NORCO/VICODIN) 5-325 MG tablet Take 1 tablet by mouth every 4 (four) hours as needed for moderate pain ((score 4 to 6)). 05/28/22   Julio Sicks, MD  MELATONIN PO Take 25 mg by mouth at bedtime as needed (sleep).    [provider]  methocarbamol (ROBAXIN) 500 MG tablet Take 1 tablet (500 mg total) by mouth every 6 (six) hours as needed for muscle spasms. 05/28/22   Julio Sicks, MD  Pediatric Multivitamins-Iron (FLINTSTONES PLUS IRON PO) Take 1 tablet by mouth in the morning.    [provider]  Potassium 99 MG TABS Take 99 mg by mouth every evening.    [provider]  senna (SENOKOT) 8.6 MG tablet Take 4 tablets by mouth at bedtime.    [provider]  TURMERIC PO Take 1 capsule by mouth daily.    [provider]  vitamin B-12 (CYANOCOBALAMIN) 1000 MCG tablet Take 1,000 mcg by mouth in the morning.    [provider]  vitamin C (ASCORBIC ACID) 500 MG tablet Take 500 mg by mouth in the morning.    [provider]  zinc gluconate 50 MG tablet Take 50 mg by mouth daily.    [provider]    Physical Exam: Vitals:   06/13/23 1946 06/13/23 1949  BP:  118/67  Pulse:  64  Resp:  15  Temp:  (!) 97.5 F (36.4 C)  TempSrc:  Oral  SpO2:  96%  Weight: 51.7 kg   Height: 5' 2.5" (1.588 m)    Constitutional: Resting supine in bed, NAD, calm, comfortable Eyes: EOMI, lids and conjunctivae normal ENMT: Mucous membranes are moist. Posterior pharynx clear of any exudate or lesions.Normal dentition.  Neck:  normal, supple, no masses. Respiratory: clear to auscultation anteriorly, no wheezing, no crackles. Normal respiratory effort. No accessory muscle use.  Cardiovascular: Regular rate and rhythm, no murmurs / rubs / gallops. No extremity edema. 2+ pedal pulses. Abdomen: no tenderness, no masses palpated.  Musculoskeletal: RLE shortened and externally rotated Skin: no rashes, lesions, ulcers. No induration Neurologic: Sensation intact. Strength 5/5 in bilateral upper extremities, LLE, limited RLE due to hip fracture. Psychiatric: Normal judgment and insight. Alert and oriented x 3. Normal mood.   EKG: Personally reviewed. Sinus rhythm, rate 55, PACs present.  PAC new when compared to previous.  Assessment/Plan Principal Problem:   Closed comminuted intertrochanteric fracture of proximal end of right femur, initial encounter (HCC) Active Problems:   Macrocytic anemia   Amanda West is a 77 y.o. female with medical history significant for cervical spinal stenosis s/p C3-4 ACDF who is admitted with an acute right hip fracture.  Orthopedics consulted with tentative plans for surgical fixation 7/7.  Assessment and Plan: * Closed comminuted intertrochanteric fracture  of proximal end of right femur, initial encounter (HCC) Occurring after mechanical fall.  Orthopedics consulted, tentative plan for surgical fixation 7/7 pending or availability.  Patient is considered a low risk perioperative surgical candidate. -N.p.o. after midnight -Continue analgesics as needed -Surgical fixation per orthopedic team  Macrocytic anemia Hemoglobin stable.  DVT prophylaxis: SCDs Start: 06/13/23 2154 Code Status: Full code, confirmed with patient on admission Family Communication: Spouse at bedside Disposition Plan: From home, dispo pending clinical progress Consults called: Orthopedics Severity of Illness: The appropriate patient status for this patient is INPATIENT. Inpatient status is judged to be reasonable and necessary in order to provide the required intensity of service to ensure the patient's safety. The patient's presenting symptoms, physical exam findings, and initial radiographic and laboratory data in the context of their chronic comorbidities is felt to place them at high risk for further clinical deterioration. Furthermore, it is not anticipated that the patient will be medically stable for discharge from the hospital within 2 midnights of admission.   * I certify that at the point of admission it is my clinical judgment that the patient will require inpatient hospital care spanning beyond 2 midnights from the point of admission due to high intensity of service, high risk for further deterioration and high frequency of surveillance required.Darreld Mclean MD [facility]  If 7PM-7AM, please contact night-coverage [email or web address]  06/13/2023, 9:59 PM

## 2023-06-13 NOTE — Assessment & Plan Note (Signed)
Hemoglobin stable 

## 2023-06-13 NOTE — ED Notes (Signed)
ED TO INPATIENT HANDOFF REPORT  ED Nurse Name and Phone #: Lanora Manis [phone]  S Name/Age/Gender Amanda West 77 y.o. female Room/Bed: 029C/029C  Code Status   Code Status: Full Code  Home/SNF/Other Home Patient oriented to: self, place, time, and situation Is this baseline? Yes   Triage Complete: Triage complete  Chief Complaint Closed comminuted intertrochanteric fracture of proximal end of right femur, initial encounter [facility]) [S72.141A]  Triage Note Pt arrived from home via [facility] s/p mechanical fall landing on right hip. Pt c/o right hip pain 10/10 with movement and states that she is having spasms. Pt Denies blood thinners. Pt A &O x 4. 200 fentanyl and 4 zofran enroute    Allergies Allergies  Allergen Reactions   Codeine Nausea Only   Pamelor [Nortriptyline]     Blurred vision    Level of Care/Admitting Diagnosis ED Disposition     ED Disposition  Admit   Condition  --   Comment  Hospital Area: [facility] [[id number]]  Level of Care: Med-Surg [16]  May admit patient to [facility] or [facility] if equivalent level of care is available:: No  Covid Evaluation: Asymptomatic - no recent exposure (last 10 days) testing not required  Diagnosis: Closed comminuted intertrochanteric fracture of proximal end of right femur, initial encounter [facility]) [[id number]]  Admitting Physician: Charlsie Quest [[id number]]  Attending Physician: Charlsie Quest [[id number]]  Certification:: I certify this patient will need inpatient services for at least 2 midnights  Estimated Length of Stay: 3          B Medical/Surgery History Past Medical History:  Diagnosis Date   Abnormality of gait    previous was seen by neurology-- dr c. Anne Hahn--- relased lov 08-31-2019, much improved per pt   Arthritis    Chronic constipation    Cornea scar    right eye   Eyes sensitive to light, bilateral    per pt right > left due to residual shingles    IDA (iron deficiency anemia)     Neuromuscular disorder (HCC)    Neuropathy   Osteoporosis    Spinal stenosis in cervical region 09/15/2014   Subcutaneous mass    bilateral abdominal    Vitamin D deficiency    Wears glasses    Past Surgical History:  Procedure Laterality Date   ABDOMINAL HERNIA REPAIR  1980s   ANTERIOR CERVICAL DECOMP/DISCECTOMY FUSION N/A 09/21/2017   Procedure: Anterior Cervical Decompression Fusion Cervical Four-Five,Cervical Five-Six,Cervical Six-Seven;  Surgeon: Julio Sicks, MD;  Location: [facility] OR;  Service: Neurosurgery;  Laterality: N/A;  anterior    ANTERIOR CERVICAL DECOMP/DISCECTOMY FUSION N/A 05/27/2022   Procedure: CERVICAL THREE-FOUR ANTERIOR CERVICAL DECOMPRESSION/DISCECTOMY FUSION;  Surgeon: Julio Sicks, MD;  Location: [facility] OR;  Service: Neurosurgery;  Laterality: N/A;  3C   CATARACT EXTRACTION W/ INTRAOCULAR LENS  IMPLANT, BILATERAL  1990's   MASS EXCISION N/A 12/19/2020   Procedure: EXCISION OF RIGHT LOWER ABDOMEN SUBCUTANEOUS MASS, EXCISION OF LEFT LOWER ABDOMEN SUBCUTANEOUS MASS;  Surgeon: Quentin Ore, MD;  Location: [facility];  Service: General;  Laterality: N/A;   PARS PLANA VITRECTOMY  07/06/2012   Procedure: PARS PLANA VITRECTOMY WITH 25G REMOVAL/SUTURE INTRAOCULAR LENS;  Surgeon: Sherrie George, MD;  Location: [facility] OR;  Service: Ophthalmology;  Laterality: Left;   TUBAL LIGATION     VITRECTOMY AND CATARACT Right ?   IOL replaced     A IV Location/Drains/Wounds Patient Lines/Drains/Airways Status     Active Line/Drains/Airways  Name Placement date Placement time Site Days   Peripheral IV 07/06/12 07/06/12  1056  --  3994            Intake/Output Last 24 hours No intake or output data in the 24 hours ending 06/13/23 2206  Labs/Imaging Results for orders placed or performed during the hospital encounter of 06/13/23 (from the past 48 hour(s))  Basic metabolic panel     Status: Abnormal   Collection Time: 06/13/23  8:22 PM  Result Value  Ref Range   Sodium 138 135 - 145 mmol/L   Potassium 3.8 3.5 - 5.1 mmol/L   Chloride 104 98 - 111 mmol/L   CO2 26 22 - 32 mmol/L   Glucose, Bld 116 (H) 70 - 99 mg/dL    Comment: Glucose reference range applies only to samples taken after fasting for at least 8 hours.   BUN 11 8 - 23 mg/dL   Creatinine, Ser 1.61 0.44 - 1.00 mg/dL   Calcium 8.6 (L) 8.9 - 10.3 mg/dL   GFR, Estimated >09 >60 mL/min    Comment: (NOTE) Calculated using the CKD-EPI Creatinine Equation (2021)    Anion gap 8 5 - 15    Comment: Performed at [facility], [address] [place], [place] [address]  CBC with Differential     Status: Abnormal   Collection Time: 06/13/23  8:22 PM  Result Value Ref Range   WBC 3.7 (L) 4.0 - 10.5 K/uL   RBC 3.19 (L) 3.87 - 5.11 MIL/uL   Hemoglobin 10.8 (L) 12.0 - 15.0 g/dL   HCT 81.1 (L) 91.4 - 78.2 %   MCV 102.5 (H) 80.0 - 100.0 fL   MCH 33.9 26.0 - 34.0 pg   MCHC 33.0 30.0 - 36.0 g/dL   RDW 95.6 21.3 - 08.6 %   Platelets 185 150 - 400 K/uL   nRBC 0.0 0.0 - 0.2 %   Neutrophils Relative % 57 %   Neutro Abs 2.2 1.7 - 7.7 K/uL   Lymphocytes Relative 33 %   Lymphs Abs 1.2 0.7 - 4.0 K/uL   Monocytes Relative 7 %   Monocytes Absolute 0.3 0.1 - 1.0 K/uL   Eosinophils Relative 2 %   Eosinophils Absolute 0.1 0.0 - 0.5 K/uL   Basophils Relative 1 %   Basophils Absolute 0.0 0.0 - 0.1 K/uL   Immature Granulocytes 0 %   Abs Immature Granulocytes 0.01 0.00 - 0.07 K/uL    Comment: Performed at [facility], [address] [place], [place] [address]  Protime-INR     Status: None   Collection Time: 06/13/23  8:22 PM  Result Value Ref Range   Prothrombin Time 14.4 11.4 - 15.2 seconds   INR 1.1 0.8 - 1.2    Comment: (NOTE) INR goal varies based on device and disease states. Performed at [facility], [address] [place], [place] [address]   Type and screen [facility]     Status: None   Collection Time: 06/13/23  8:22 PM  Result Value Ref  Range   ABO/RH(D) A POS    Antibody Screen NEG    Sample Expiration      06/16/2023,2359 Performed at [facility], [address], [place], [place] [address]   ABO/Rh     Status: None   Collection Time: 06/13/23  8:45 PM  Result Value Ref Range   ABO/RH(D)      A POS Performed at  [facility], [address] [place], [place] [address]    DG Hip Unilat With Pelvis 2-3 Views Right  Result Date: 06/13/2023 CLINICAL DATA:  Status post fall. EXAM: DG HIP (WITH OR WITHOUT PELVIS) 2-3V RIGHT COMPARISON:  None Available. FINDINGS: There is an acute, comminuted fracture deformity extending through the inter trochanteric region of the proximal right femur. There is no evidence of dislocation. Moderate severity degenerative changes seen involving both hips, in the form of joint space narrowing and acetabular sclerosis. IMPRESSION: Acute, comminuted intertrochanteric fracture of the proximal right femur. Electronically Signed   By: Aram Candela M.D.   On: 06/13/2023 21:20    Pending Labs Unresulted Labs (From admission, onward)     Start     Ordered   06/14/23 0500  CBC  Tomorrow morning,   R        06/13/23 2156   06/14/23 0500  Basic metabolic panel  Tomorrow morning,   R        06/13/23 2156   06/14/23 0500  VITAMIN D 25 Hydroxy (Vit-D Deficiency, Fractures)  Tomorrow morning,   R        06/13/23 2156   06/14/23 0500  Folate  Tomorrow morning,   R        06/13/23 2156   06/14/23 0500  Vitamin B12  Tomorrow morning,   R        06/13/23 2156            Vitals/Pain Today's Vitals   06/13/23 1944 06/13/23 1946 06/13/23 1949  BP:   118/67  Pulse:   64  Resp:   15  Temp:   (!) 97.5 F (36.4 C)  TempSrc:   Oral  SpO2:   96%  Weight:  51.7 kg   Height:  5' 2.5" (1.588 m)   PainSc: 10-Worst pain ever      Isolation Precautions No active isolations  Medications Medications  HYDROmorphone (DILAUDID) injection 0.5 mg (has no administration in time range)   HYDROcodone-acetaminophen (NORCO/VICODIN) 5-325 MG per tablet 1-2 tablet (has no administration in time range)  morphine (PF) 2 MG/ML injection 0.5 mg (has no administration in time range)  acetaminophen (TYLENOL) tablet 650 mg (has no administration in time range)  senna-docusate (Senokot-S) tablet 1 tablet (has no administration in time range)  ondansetron (ZOFRAN) injection 4 mg (has no administration in time range)  0.9 %  sodium chloride infusion (has no administration in time range)    Mobility non-ambulatory     Focused Assessments Peripheral Vascular Assessment RLE pulses intact through out and strong   R Recommendations: See Admitting Provider Note  Report given to:   Additional Notes:

## 2023-06-13 NOTE — Hospital Course (Signed)
Amanda West is a 77 y.o. female with medical history significant for cervical spinal stenosis s/p C3-4 ACDF who is admitted with an acute right hip fracture.  Orthopedics consulted with tentative plans for surgical fixation 7/7.

## 2023-06-14 ENCOUNTER — Encounter (HOSPITAL_COMMUNITY)
Admission: EM | Disposition: A | Discharge status: Home Health | Payer: Self-pay | Source: Home / Self Care | Attending: Internal Medicine

## 2023-06-14 ENCOUNTER — Encounter (HOSPITAL_COMMUNITY): Payer: Self-pay | Admitting: Internal Medicine

## 2023-06-14 ENCOUNTER — Inpatient Hospital Stay (HOSPITAL_COMMUNITY): Payer: Medicare Other

## 2023-06-14 ENCOUNTER — Inpatient Hospital Stay (HOSPITAL_COMMUNITY): Payer: Medicare Other | Admitting: Anesthesiology

## 2023-06-14 DIAGNOSIS — S72141A Displaced intertrochanteric fracture of right femur, initial encounter for closed fracture: Secondary | ICD-10-CM

## 2023-06-14 HISTORY — PX: INTRAMEDULLARY (IM) NAIL INTERTROCHANTERIC: SHX5875

## 2023-06-14 LAB — BASIC METABOLIC PANEL
Anion gap: 7 (ref 5–15)
BUN: 11 mg/dL (ref 8–23)
CO2: 27 mmol/L (ref 22–32)
Calcium: 8.6 mg/dL — ABNORMAL LOW (ref 8.9–10.3)
Chloride: 103 mmol/L (ref 98–111)
Creatinine, Ser: 0.69 mg/dL (ref 0.44–1.00)
GFR, Estimated: >60 mL/min (ref >60)
Glucose, Bld: 116 mg/dL — ABNORMAL HIGH (ref 70–99)
Potassium: 3.7 mmol/L (ref 3.5–5.1)
Sodium: 137 mmol/L (ref 135–145)

## 2023-06-14 LAB — SURGICAL PCR SCREEN
MRSA, PCR: NEGATIVE
Staphylococcus aureus: NEGATIVE

## 2023-06-14 LAB — CBC
HCT: 28.9 % — ABNORMAL LOW (ref 36.0–46.0)
Hemoglobin: 9.5 g/dL — ABNORMAL LOW (ref 12.0–15.0)
MCH: 33 pg (ref 26.0–34.0)
MCHC: 32.9 g/dL (ref 30.0–36.0)
MCV: 100.3 fL — ABNORMAL HIGH (ref 80.0–100.0)
Platelets: 173 10*3/uL (ref 150–400)
RBC: 2.88 MIL/uL — ABNORMAL LOW (ref 3.87–5.11)
RDW: 12.8 % (ref 11.5–15.5)
WBC: 7.8 10*3/uL (ref 4.0–10.5)
nRBC: 0 % (ref 0.0–0.2)

## 2023-06-14 LAB — FOLATE: Folate: 34.5 ng/mL (ref >5.9)

## 2023-06-14 LAB — VITAMIN B12: Vitamin B-12: 2856 pg/mL — ABNORMAL HIGH (ref 180–914)

## 2023-06-14 LAB — VITAMIN D 25 HYDROXY (VIT D DEFICIENCY, FRACTURES): Vit D, 25-Hydroxy: 93.7 ng/mL (ref 30–100)

## 2023-06-14 SURGERY — FIXATION, FRACTURE, INTERTROCHANTERIC, WITH INTRAMEDULLARY ROD
Anesthesia: General | Laterality: Right

## 2023-06-14 MED ORDER — FENTANYL CITRATE (PF) 250 MCG/5ML IJ SOLN
INTRAMUSCULAR | Status: AC
  Filled 2023-06-14: qty 5

## 2023-06-14 MED ORDER — LIDOCAINE 2% (20 MG/ML) 5 ML SYRINGE
INTRAMUSCULAR | Status: DC | PRN
  Administered 2023-06-14: 50 mg via INTRAVENOUS

## 2023-06-14 MED ORDER — SODIUM CHLORIDE 0.9 % IV SOLN: INTRAVENOUS | Status: DC

## 2023-06-14 MED ORDER — ONDANSETRON HCL 4 MG/2ML IJ SOLN: 4.0000 mg | Freq: Four times a day (QID) | INTRAMUSCULAR | Status: DC | PRN

## 2023-06-14 MED ORDER — 0.9 % SODIUM CHLORIDE (POUR BTL) OPTIME
TOPICAL | Status: DC | PRN
  Administered 2023-06-14: 1000 mL

## 2023-06-14 MED ORDER — TRANEXAMIC ACID-NACL 1000-0.7 MG/100ML-% IV SOLN
1000.0000 mg | Freq: Once | INTRAVENOUS | Status: AC
  Administered 2023-06-14: 1000 mg via INTRAVENOUS
  Filled 2023-06-14: qty 100

## 2023-06-14 MED ORDER — LACTATED RINGERS IV SOLN: INTRAVENOUS | Status: DC

## 2023-06-14 MED ORDER — ONDANSETRON HCL 4 MG/2ML IJ SOLN
INTRAMUSCULAR | Status: DC | PRN
  Administered 2023-06-14: 4 mg via INTRAVENOUS

## 2023-06-14 MED ORDER — PHENYLEPHRINE HCL-NACL 20-0.9 MG/250ML-% IV SOLN
INTRAVENOUS | Status: DC | PRN
  Administered 2023-06-14: 75 ug/min via INTRAVENOUS

## 2023-06-14 MED ORDER — PROPOFOL 10 MG/ML IV BOLUS
INTRAVENOUS | Status: DC | PRN
  Administered 2023-06-14: 80 mg via INTRAVENOUS

## 2023-06-14 MED ORDER — AMISULPRIDE (ANTIEMETIC) 5 MG/2ML IV SOLN: 10.0000 mg | Freq: Once | INTRAVENOUS | Status: DC | PRN

## 2023-06-14 MED ORDER — HYDROMORPHONE HCL 1 MG/ML IJ SOLN
0.2500 mg | INTRAMUSCULAR | Status: DC | PRN
  Administered 2023-06-14: 0.25 mg via INTRAVENOUS
  Administered 2023-06-14: 0.5 mg via INTRAVENOUS

## 2023-06-14 MED ORDER — METOCLOPRAMIDE HCL 5 MG/ML IJ SOLN: 5.0000 mg | Freq: Three times a day (TID) | INTRAMUSCULAR | Status: DC | PRN

## 2023-06-14 MED ORDER — PROPOFOL 10 MG/ML IV BOLUS
INTRAVENOUS | Status: AC
  Filled 2023-06-14: qty 20

## 2023-06-14 MED ORDER — FENTANYL CITRATE (PF) 250 MCG/5ML IJ SOLN
INTRAMUSCULAR | Status: DC | PRN
  Administered 2023-06-14: 25 ug via INTRAVENOUS
  Administered 2023-06-14: 100 ug via INTRAVENOUS

## 2023-06-14 MED ORDER — SUGAMMADEX SODIUM 200 MG/2ML IV SOLN
INTRAVENOUS | Status: DC | PRN
  Administered 2023-06-14: 200 mg via INTRAVENOUS

## 2023-06-14 MED ORDER — CEFAZOLIN SODIUM-DEXTROSE 2-4 GM/100ML-% IV SOLN
2.0000 g | Freq: Three times a day (TID) | INTRAVENOUS | Status: AC
  Administered 2023-06-14 – 2023-06-15 (×3): 2 g via INTRAVENOUS
  Filled 2023-06-14 (×3): qty 100

## 2023-06-14 MED ORDER — METHOCARBAMOL 500 MG PO TABS
500.0000 mg | ORAL_TABLET | Freq: Four times a day (QID) | ORAL | Status: DC | PRN
  Administered 2023-06-14 – 2023-06-17 (×6): 500 mg via ORAL
  Filled 2023-06-14 (×6): qty 1

## 2023-06-14 MED ORDER — LACTATED RINGERS IV SOLN: INTRAVENOUS | Status: DC | PRN

## 2023-06-14 MED ORDER — DIPHENHYDRAMINE HCL 12.5 MG/5ML PO ELIX
12.5000 mg | ORAL_SOLUTION | ORAL | Status: DC | PRN
  Administered 2023-06-14: 25 mg via ORAL
  Filled 2023-06-14 (×2): qty 10

## 2023-06-14 MED ORDER — CHLORHEXIDINE GLUCONATE 0.12 % MT SOLN
15.0000 mL | Freq: Once | OROMUCOSAL | Status: AC
  Administered 2023-06-14: 15 mL via OROMUCOSAL

## 2023-06-14 MED ORDER — ORAL CARE MOUTH RINSE: 15.0000 mL | Freq: Once | OROMUCOSAL | Status: AC

## 2023-06-14 MED ORDER — CEFAZOLIN SODIUM-DEXTROSE 2-4 GM/100ML-% IV SOLN
2.0000 g | Freq: Once | INTRAVENOUS | Status: AC
  Administered 2023-06-14: 2 g via INTRAVENOUS

## 2023-06-14 MED ORDER — ACETAMINOPHEN 10 MG/ML IV SOLN
INTRAVENOUS | Status: AC
  Filled 2023-06-14: qty 100

## 2023-06-14 MED ORDER — POLYETHYLENE GLYCOL 3350 17 G PO PACK: 17.0000 g | PACK | Freq: Every day | ORAL | Status: DC | PRN

## 2023-06-14 MED ORDER — DOCUSATE SODIUM 100 MG PO CAPS
100.0000 mg | ORAL_CAPSULE | Freq: Two times a day (BID) | ORAL | Status: DC
  Administered 2023-06-14 – 2023-06-18 (×8): 100 mg via ORAL
  Filled 2023-06-14 (×8): qty 1

## 2023-06-14 MED ORDER — ENOXAPARIN SODIUM 40 MG/0.4ML IJ SOSY: 40.0000 mg | PREFILLED_SYRINGE | INTRAMUSCULAR | Status: DC

## 2023-06-14 MED ORDER — HYDROXYZINE HCL 25 MG PO TABS
25.0000 mg | ORAL_TABLET | Freq: Three times a day (TID) | ORAL | Status: DC | PRN
  Administered 2023-06-14: 25 mg via ORAL
  Filled 2023-06-14: qty 1

## 2023-06-14 MED ORDER — CEFAZOLIN SODIUM-DEXTROSE 2-4 GM/100ML-% IV SOLN
INTRAVENOUS | Status: AC
  Filled 2023-06-14: qty 100

## 2023-06-14 MED ORDER — HYDROMORPHONE HCL 1 MG/ML IJ SOLN
INTRAMUSCULAR | Status: AC
  Filled 2023-06-14: qty 1

## 2023-06-14 MED ORDER — ONDANSETRON HCL 4 MG PO TABS: 4.0000 mg | ORAL_TABLET | Freq: Four times a day (QID) | ORAL | Status: DC | PRN

## 2023-06-14 MED ORDER — ALBUMIN HUMAN 5 % IV SOLN: INTRAVENOUS | Status: DC | PRN

## 2023-06-14 MED ORDER — ROCURONIUM BROMIDE 10 MG/ML (PF) SYRINGE
PREFILLED_SYRINGE | INTRAVENOUS | Status: DC | PRN
  Administered 2023-06-14: 40 mg via INTRAVENOUS

## 2023-06-14 MED ORDER — PHENYLEPHRINE 80 MCG/ML (10ML) SYRINGE FOR IV PUSH (FOR BLOOD PRESSURE SUPPORT)
PREFILLED_SYRINGE | INTRAVENOUS | Status: DC | PRN
  Administered 2023-06-14 (×3): 80 ug via INTRAVENOUS

## 2023-06-14 MED ORDER — ACETAMINOPHEN 325 MG PO TABS
650.0000 mg | ORAL_TABLET | Freq: Four times a day (QID) | ORAL | Status: DC
  Administered 2023-06-14 – 2023-06-18 (×17): 650 mg via ORAL
  Filled 2023-06-14 (×16): qty 2

## 2023-06-14 MED ORDER — OXYCODONE HCL 5 MG PO TABS
5.0000 mg | ORAL_TABLET | ORAL | Status: DC | PRN
  Filled 2023-06-14: qty 1

## 2023-06-14 MED ORDER — METOCLOPRAMIDE HCL 5 MG PO TABS: 5.0000 mg | ORAL_TABLET | Freq: Three times a day (TID) | ORAL | Status: DC | PRN

## 2023-06-14 MED ORDER — METHOCARBAMOL 1000 MG/10ML IJ SOLN: 500.0000 mg | Freq: Four times a day (QID) | INTRAVENOUS | Status: DC | PRN

## 2023-06-14 MED ORDER — DEXAMETHASONE SODIUM PHOSPHATE 10 MG/ML IJ SOLN
INTRAMUSCULAR | Status: DC | PRN
  Administered 2023-06-14: 5 mg via INTRAVENOUS

## 2023-06-14 SURGICAL SUPPLY — 53 items
ADH SKN CLS APL DERMABOND .7 (GAUZE/BANDAGES/DRESSINGS) ×1
ADH SKN CLS LQ APL DERMABOND (GAUZE/BANDAGES/DRESSINGS) ×1
APL PRP STRL LF DISP 70% ISPRP (MISCELLANEOUS) ×1
BAG COUNTER SPONGE SURGICOUNT (BAG) IMPLANT
BAG SPNG CNTER NS LX DISP (BAG)
BIT DRILL INTERTAN LAG SCREW (BIT) IMPLANT
BIT DRILL SHORT 4.0 (BIT) IMPLANT
BRUSH SCRUB EZ PLAIN DRY (MISCELLANEOUS) ×2 IMPLANT
CHLORAPREP W/TINT 26 (MISCELLANEOUS) ×1 IMPLANT
COVER MAYO STAND STRL (DRAPES) IMPLANT
COVER PERINEAL POST (MISCELLANEOUS) ×1 IMPLANT
COVER SURGICAL LIGHT HANDLE (MISCELLANEOUS) ×1 IMPLANT
DERMABOND ADVANCED .7 DNX12 (GAUZE/BANDAGES/DRESSINGS) ×1 IMPLANT
DERMABOND ADVANCED .7 DNX6 (GAUZE/BANDAGES/DRESSINGS) IMPLANT
DRAPE C-ARM 35X43 STRL (DRAPES) ×1 IMPLANT
DRAPE IMP U-DRAPE 54X76 (DRAPES) ×2 IMPLANT
DRAPE INCISE IOBAN 66X45 STRL (DRAPES) ×1 IMPLANT
DRAPE STERI IOBAN 125X83 (DRAPES) ×1 IMPLANT
DRAPE SURG 17X23 STRL (DRAPES) ×2 IMPLANT
DRAPE U-SHAPE 47X51 STRL (DRAPES) ×1 IMPLANT
DRESSING MEPILEX FLEX 4X4 (GAUZE/BANDAGES/DRESSINGS) ×1 IMPLANT
DRILL BIT SHORT 4.0 (BIT) ×1
DRSG MEPILEX FLEX 4X4 (GAUZE/BANDAGES/DRESSINGS) ×3
DRSG MEPILEX POST OP 4X8 (GAUZE/BANDAGES/DRESSINGS) ×1 IMPLANT
ELECT REM PT RETURN 9FT ADLT (ELECTROSURGICAL) ×1
ELECTRODE REM PT RTRN 9FT ADLT (ELECTROSURGICAL) ×1 IMPLANT
GLOVE BIO SURGEON STRL SZ 6.5 (GLOVE) ×3 IMPLANT
GLOVE BIO SURGEON STRL SZ7.5 (GLOVE) ×4 IMPLANT
GLOVE BIOGEL PI IND STRL 6.5 (GLOVE) ×1 IMPLANT
GLOVE BIOGEL PI IND STRL 7.5 (GLOVE) ×1 IMPLANT
GOWN STRL REUS W/ TWL LRG LVL3 (GOWN DISPOSABLE) ×1 IMPLANT
GOWN STRL REUS W/TWL LRG LVL3 (GOWN DISPOSABLE) ×1
GUIDE PIN 3.2X343 (PIN) ×2
GUIDE PIN 3.2X343MM (PIN) ×2
GUIDE ROD 3.0 (MISCELLANEOUS) ×1
KIT BASIN OR (CUSTOM PROCEDURE TRAY) ×1 IMPLANT
KIT TURNOVER KIT B (KITS) ×1 IMPLANT
MANIFOLD NEPTUNE II (INSTRUMENTS) ×1 IMPLANT
NAIL LOCK CANN 10X360 130D RT (Nail) IMPLANT
NS IRRIG 1000ML POUR BTL (IV SOLUTION) ×1 IMPLANT
PACK GENERAL/GYN (CUSTOM PROCEDURE TRAY) ×1 IMPLANT
PAD ARMBOARD 7.5X6 YLW CONV (MISCELLANEOUS) ×2 IMPLANT
PIN GUIDE 3.2X343MM (PIN) IMPLANT
ROD GUIDE 3.0 (MISCELLANEOUS) IMPLANT
SCREW LAG COMPR KIT 100/95 (Screw) IMPLANT
SCREW TRIGEN LOW PROF 5.0X45 (Screw) IMPLANT
SUT MNCRL AB 3-0 PS2 18 (SUTURE) ×1 IMPLANT
SUT VIC AB 0 CT1 27 (SUTURE)
SUT VIC AB 0 CT1 27XBRD ANBCTR (SUTURE) IMPLANT
SUT VIC AB 2-0 CT1 27 (SUTURE) ×2
SUT VIC AB 2-0 CT1 TAPERPNT 27 (SUTURE) ×2 IMPLANT
TOWEL GREEN STERILE (TOWEL DISPOSABLE) ×2 IMPLANT
WATER STERILE IRR 1000ML POUR (IV SOLUTION) ×1 IMPLANT

## 2023-06-14 NOTE — Anesthesia Procedure Notes (Signed)
Procedure Name: Intubation Date/Time: 06/14/2023 7:49 AM  Performed by: Gwenyth Allegra, CRNAPre-anesthesia Checklist: Emergency Drugs available, Patient identified, Suction available, Patient being monitored and Timeout performed Patient Re-evaluated:Patient Re-evaluated prior to induction Oxygen Delivery Method: Circle system utilized Preoxygenation: Pre-oxygenation with 100% oxygen Induction Type: IV induction Ventilation: Mask ventilation without difficulty Laryngoscope Size: Glidescope Grade View: Grade I Tube type: Oral Tube size: 7.0 mm Number of attempts: 1 Airway Equipment and Method: Stylet and Video-laryngoscopy Placement Confirmation: ETT inserted through vocal cords under direct vision, positive ETCO2 and breath sounds checked- equal and bilateral Secured at: 21 cm Tube secured with: Tape Dental Injury: Teeth and Oropharynx as per pre-operative assessment

## 2023-06-14 NOTE — Anesthesia Preprocedure Evaluation (Addendum)
Anesthesia Evaluation  Patient identified by MRN, date of birth, ID band Patient awake    Reviewed: Allergy & Precautions, NPO status , Patient's Chart, lab work & pertinent test results  Airway Mallampati: II  TM Distance: >3 FB Neck ROM: Full    Dental  (+) Dental Advisory Given, Teeth Intact   Pulmonary former smoker   Pulmonary exam normal        Cardiovascular negative cardio ROS  Rhythm:Irregular Rate:Normal     Neuro/Psych  negative psych ROS   GI/Hepatic negative GI ROS, Neg liver ROS,,,  Endo/Other  negative endocrine ROS    Renal/GU negative Renal ROS  negative genitourinary   Musculoskeletal  (+) Arthritis , Osteoarthritis,  Cervical stenosis   Abdominal Normal abdominal exam  (+)   Peds  Hematology  (+) Blood dyscrasia, anemia   Anesthesia Other Findings   Reproductive/Obstetrics                             Anesthesia Physical Anesthesia Plan  ASA: 2  Anesthesia Plan: General   Post-op Pain Management: Ofirmev IV (intra-op)*   Induction: Intravenous  PONV Risk Score and Plan: 3 and Ondansetron, Dexamethasone and Treatment may vary due to age or medical condition  Airway Management Planned: Oral ETT and Video Laryngoscope Planned  Additional Equipment: None  Intra-op Plan:   Post-operative Plan: Extubation in OR  Informed Consent: I have reviewed the patients History and Physical, chart, labs and discussed the procedure including the risks, benefits and alternatives for the proposed anesthesia with the patient or authorized representative who has indicated his/her understanding and acceptance.     Dental advisory given  Plan Discussed with: CRNA  Anesthesia Plan Comments:         Anesthesia Quick Evaluation

## 2023-06-14 NOTE — Anesthesia Postprocedure Evaluation (Signed)
Anesthesia Post Note  Patient: Amanda West  Procedure(s) Performed: INTRAMEDULLARY (IM) NAIL INTERTROCHANTERIC (Right)     Patient location during evaluation: PACU Anesthesia Type: General Level of consciousness: sedated and patient cooperative Pain management: pain level controlled Vital Signs Assessment: post-procedure vital signs reviewed and stable Respiratory status: spontaneous breathing Cardiovascular status: stable Anesthetic complications: no   No notable events documented.  Last Vitals:  Vitals:   06/14/23 1016 06/14/23 1520  BP: (!) 105/52 (!) 123/47  Pulse: 77 66  Resp: 14   Temp: 36.4 C   SpO2: (!) 89% 99%    Last Pain:  Vitals:   06/14/23 1016  TempSrc: Oral  PainSc:                  Lewie Loron

## 2023-06-14 NOTE — Op Note (Signed)
Orthopaedic Surgery Operative Note (CSN: [id number] ) Date of Surgery: 06/14/2023  Admit Date: 06/13/2023   Diagnoses: Pre-Op Diagnoses: Right intertrochanteric femur fracture  Post-Op Diagnosis: Same  Procedures: CPT 27245-Cephalomedullary nailing of right intertrochanteric femur fracture  Surgeons : Primary: Roby Lofts, MD  Assistant: Ulyses Southward, PA-C  Location: OR 3   Anesthesia: General   Antibiotics: Ancef 2g preop   Tourniquet time: none    Estimated Blood Loss: 100 mL  Complications: None   Specimens: None  Implants: Implant Name Type Inv. Item Serial No. Manufacturer Lot No. LRB No. Used Action  NAIL LOCK CANN 10X360 130D RT - [id number] Nail NAIL LOCK CANN 10X360 130D RT  [employer] [id number] Right 1 Implanted  SCREW LAG COMPR KIT 100/95 - [id number] Screw SCREW LAG COMPR KIT 100/95  [employer] [id number] Right 1 Implanted  SCREW TRIGEN LOW PROF 5.0X45 - [id number] Screw SCREW TRIGEN LOW PROF 5.0X45  [employer] [id number] Right 1 Implanted     Indications for Surgery: 77 year old female who sustained a ground-level fall with a right intertrochanteric/subtrochanteric femur fracture.  Due to the unstable nature of her injury I recommend proceeding to the operating room for cephalomedullary nailing of her right hip.  Risks and benefits were discussed with the patient.  Risks include but not limited to bleeding, infection, malunion, nonunion, hardware failure, hardware irritation, nerve or blood vessel injury, DVT, even the possibility anesthetic complications.  She agreed to proceed with surgery and consent was obtained.  Operative Findings: Cephalomedullary nailing of right intertrochanteric/subtrochanteric femur fracture using [employer] InterTAN 10 x 360 mm nail with a 100 mm lag screw and 95 mm compression screw.  Procedure: The patient was identified in the preoperative holding area. Consent was  confirmed with the patient and their family and all questions were answered. The operative extremity was marked after confirmation with the patient. she was then brought back to the operating room by our anesthesia colleagues.  She was placed under general anesthetic and carefully transferred over to the [facility] table.  All bony prominences were well-padded.  Traction was applied to the right lower extremity to align the fracture appropriately.  The right lower extremity was then prepped and draped in usual sterile fashion.  A timeout was performed to verify the patient, the procedure, and the extremity.  Preoperative antibiotics were dosed.  Fluoroscopic imaging showed the fracture.  The proximal segment was rotated up as a result I made a percutaneous incision and used a Cobb elevator to reduce the spike back down to good alignment on the sagittal view.  I then made an small incision proximal to the greater trochanter and I directed threaded guidewire at the tip of the greater trochanter.  I advanced it into the proximal metaphysis.  I then used an entry reamer to enter the medullary canal.  I then passed a ball-tipped guidewire down the center of the canal and seated it into the distal metaphysis.  I then measured the length and chose to use a 360 mm nail.  I then sequentially reamed to 11 mm.  I then passed a 10 x 360 mm nail down the center of the canal.  Using the targeting arm I directed a threaded guidewire into the head/neck segment.  I confirmed adequate tip apex distance using fluoroscopy.  I then measured the length and chose to use 100 mm lag screw.  I then drilled the path for the compression screw.  I placed  an antirotation bar.  I then drilled the path for the lag screw.  I placed the lag screw and then placed the compression screw and compressed approximately 5 mm through the nail and device.  The proximal fragment was aligned appropriately.  I then statically locked the proximal portion of the  nail.  I removed the targeting arm and then I used perfect circle technique to place 1 lateral to medial distal interlocking screw.  Final fluoroscopic imaging was obtained.  The incisions were irrigated.  They were closed with 2-0 Monocryl and Dermabond.  Sterile dressings were applied.  The patient was then awoke from anesthesia and taken to the PACU in stable condition.  Post Op Plan/Instructions: The patient will be weightbearing as tolerated to the right lower extremity.  She will receive postoperative Ancef.  She was placed on Lovenox for DVT prophylaxis and discharged on an oral DOAC.  Will have her mobilize with physical and Occupational Therapy.  I was present and performed the entire surgery.  Ulyses Southward, PA-C did assist me throughout the case. An assistant was necessary given the difficulty in approach, maintenance of reduction and ability to instrument the fracture.   Truitt Merle, MD [facility]

## 2023-06-14 NOTE — Progress Notes (Signed)
PROGRESS NOTE    Amanda West  WUJ:[id number] DOB: 1946/01/21 DOA: 06/13/2023 PCP: Lupita Raider, MD   Brief Narrative:  Amanda West is a 77 y.o. female with medical history significant for cervical spinal stenosis s/p C3-4 ACDF who is admitted with an acute right hip fracture.  Orthopedics consulted with tentative plans for surgical fixation 7/7.    Assessment & Plan:   Principal Problem:   Closed comminuted intertrochanteric fracture of proximal end of right femur, initial encounter [facility]) Active Problems:   Macrocytic anemia  Assessment and Plan: Closed comminuted intertrochanteric fracture of proximal end of right femur, initial encounter [facility]) Patient right intertrochanteric femur fracture nailing 7/7 Currently without pain, but continues to complain of itching Benadryl ordered as needed  Macrocytic anemia Hemoglobin stable.  DVT prophylaxis:Lovenox Code Status: Full Family Communication: Husband at bedside 7/7 Disposition Plan:  Status is: Inpatient Remains inpatient appropriate because: Need for IV medications   Consultants:  Orthopedics  Procedures:  Cephalomedullary nailing of right intertrochanteric femur fracture 7/7  Antimicrobials:  Anti-infectives (From admission, onward)    Start     Dose/Rate Route Frequency Ordered Stop   06/14/23 1600  ceFAZolin (ANCEF) IVPB 2g/100 mL premix        2 g 200 mL/hr over 30 Minutes Intravenous Every 8 hours 06/14/23 1003 06/15/23 1559   06/14/23 0730  ceFAZolin (ANCEF) IVPB 2g/100 mL premix        2 g 200 mL/hr over 30 Minutes Intravenous  Once 06/14/23 0718 06/14/23 0815   06/14/23 0717  ceFAZolin (ANCEF) 2-4 GM/100ML-% IVPB       Note to Pharmacy: Crissie Sickles: cabinet override      06/14/23 0717 06/14/23 1929      Subjective: Patient seen and evaluated today after procedure.  She was noted to have significant redness and itching and is requesting Benadryl.  Objective: Vitals:   06/14/23 0915  06/14/23 0930 06/14/23 0945 06/14/23 1016  BP: 98/61 97/78 (!) 102/53 (!) 105/52  Pulse: 75 75 77 77  Resp: 17 14 15 14   Temp: 98.2 F (36.8 C)  98.3 F (36.8 C) 97.6 F (36.4 C)  TempSrc:    Oral  SpO2: 94% 95% 98% (!) 89%  Weight:      Height:        Intake/Output Summary (Last 24 hours) at 06/14/2023 1052 Last data filed at 06/14/2023 0945 Gross per 24 hour  Intake 1612.12 ml  Output 850 ml  Net 762.12 ml   Filed Weights   06/13/23 1946 06/14/23 0652  Weight: 51.7 kg 51.7 kg    Examination:  General exam: Appears calm and comfortable  Respiratory system: Clear to auscultation. Respiratory effort normal. Cardiovascular system: S1 & S2 heard, RRR.  Gastrointestinal system: Abdomen is soft Central nervous system: Alert and awake Extremities: No edema Skin: No significant lesions noted Psychiatry: Flat affect.    Data Reviewed: I have personally reviewed following labs and imaging studies  CBC: Recent Labs  Lab 06/13/23 2022 06/14/23 0258  WBC 3.7* 7.8  NEUTROABS 2.2  --   HGB 10.8* 9.5*  HCT 32.7* 28.9*  MCV 102.5* 100.3*  PLT 185 173   Basic Metabolic Panel: Recent Labs  Lab 06/13/23 2022 06/14/23 0258  NA 138 137  K 3.8 3.7  CL 104 103  CO2 26 27  GLUCOSE 116* 116*  BUN 11 11  CREATININE 0.66 0.69  CALCIUM 8.6* 8.6*   GFR: Estimated Creatinine Clearance: 47.7 mL/min (by C-G formula  based on SCr of 0.69 mg/dL). Liver Function Tests: No results for input(s): "AST", "ALT", "ALKPHOS", "BILITOT", "PROT", "ALBUMIN" in the last 168 hours. No results for input(s): "LIPASE", "AMYLASE" in the last 168 hours. No results for input(s): "AMMONIA" in the last 168 hours. Coagulation Profile: Recent Labs  Lab 06/13/23 2022  INR 1.1   Cardiac Enzymes: No results for input(s): "CKTOTAL", "CKMB", "CKMBINDEX", "TROPONINI" in the last 168 hours. BNP (last 3 results) No results for input(s): "PROBNP" in the last 8760 hours. HbA1C: No results for input(s):  "HGBA1C" in the last 72 hours. CBG: No results for input(s): "GLUCAP" in the last 168 hours. Lipid Profile: No results for input(s): "CHOL", "HDL", "LDLCALC", "TRIG", "CHOLHDL", "LDLDIRECT" in the last 72 hours. Thyroid Function Tests: No results for input(s): "TSH", "T4TOTAL", "FREET4", "T3FREE", "THYROIDAB" in the last 72 hours. Anemia Panel: Recent Labs    06/14/23 0258  VITAMINB12 2,856*  FOLATE 34.5   Sepsis Labs: No results for input(s): "PROCALCITON", "LATICACIDVEN" in the last 168 hours.  Recent Results (from the past 240 hour(s))  Surgical pcr screen     Status: None   Collection Time: 06/14/23 12:11 AM   Specimen: Nasal Mucosa; Nasal Swab  Result Value Ref Range Status   MRSA, PCR NEGATIVE NEGATIVE Final   Staphylococcus aureus NEGATIVE NEGATIVE Final    Comment: (NOTE) The Xpert SA Assay (FDA approved for NASAL specimens in patients 65 years of age and older), is one component of a comprehensive surveillance program. It is not intended to diagnose infection nor to guide or monitor treatment. Performed at [facility], [address], [place], [place] [address]          Radiology Studies: DG FEMUR PORT, MIN 2 VIEWS RIGHT  Result Date: 06/14/2023 CLINICAL DATA:  Femur fracture, postop. EXAM: RIGHT FEMUR PORTABLE 2 VIEW COMPARISON:  Preoperative radiograph FINDINGS: Femoral intramedullary nail with trans trochanteric and distal locking screw fixation traverse proximal femur fracture. Improved fracture alignment from preoperative imaging. Persistent displacement of the lesser trochanteric fracture fragment. Recent postsurgical change includes air and edema in the soft tissues. IMPRESSION: ORIF of proximal femur fracture. No immediate postoperative complication. Electronically Signed   By: Narda Rutherford M.D.   On: 06/14/2023 10:21   DG FEMUR, MIN 2 VIEWS RIGHT  Result Date: 06/14/2023 CLINICAL DATA:  Elective surgery. EXAM: RIGHT FEMUR 2 VIEWS COMPARISON:   Preoperative radiograph. FINDINGS: Six fluoroscopic spot views of the right femur obtained in the operating room. Sequential images during intramedullary nail, trans trochanteric and distal locking screw fixation of proximal femur fracture. Fluoroscopy time 89.5 seconds. Dose 5.19 mGy. IMPRESSION: Intraoperative fluoroscopy during ORIF of proximal femur fracture. Electronically Signed   By: Narda Rutherford M.D.   On: 06/14/2023 10:21   DG C-Arm 1-60 Min-No Report  Result Date: 06/14/2023 Fluoroscopy was utilized by the requesting physician.  No radiographic interpretation.   DG C-Arm 1-60 Min-No Report  Result Date: 06/14/2023 Fluoroscopy was utilized by the requesting physician.  No radiographic interpretation.   Chest Portable 1 View  Result Date: 06/13/2023 CLINICAL DATA:  PREOP EXAM: PORTABLE CHEST 1 VIEW COMPARISON:  12/20/12 FINDINGS: The heart size and mediastinal contours are within normal limits. Both lungs are clear. The visualized skeletal structures are unremarkable. IMPRESSION: No active disease. Electronically Signed   By: Deatra Robinson M.D.   On: 06/13/2023 22:14   DG Hip Unilat With Pelvis 2-3 Views Right  Result Date: 06/13/2023 CLINICAL DATA:  Status post fall. EXAM: DG HIP (  WITH OR WITHOUT PELVIS) 2-3V RIGHT COMPARISON:  None Available. FINDINGS: There is an acute, comminuted fracture deformity extending through the inter trochanteric region of the proximal right femur. There is no evidence of dislocation. Moderate severity degenerative changes seen involving both hips, in the form of joint space narrowing and acetabular sclerosis. IMPRESSION: Acute, comminuted intertrochanteric fracture of the proximal right femur. Electronically Signed   By: Aram Candela M.D.   On: 06/13/2023 21:20        Scheduled Meds:  acetaminophen  650 mg Oral Q6H   docusate sodium  100 mg Oral BID   [START ON 06/15/2023] enoxaparin (LOVENOX) injection  40 mg Subcutaneous Q24H   HYDROmorphone        Continuous Infusions:  sodium chloride 75 mL/hr at 06/14/23 1023   ceFAZolin      ceFAZolin (ANCEF) IV     methocarbamol (ROBAXIN) IV     tranexamic acid       LOS: 1 day    Time spent: 35 minutes    Itxel Wickard Hoover Brunette, DO [facility]  If 7PM-7AM, please contact night-coverage [email or web address] 06/14/2023, 10:52 AM

## 2023-06-14 NOTE — Plan of Care (Signed)
  Problem: Activity: Goal: Risk for activity intolerance will decrease Outcome: Progressing   Problem: Nutrition: Goal: Adequate nutrition will be maintained Outcome: Progressing   Problem: Coping: Goal: Level of anxiety will decrease Outcome: Progressing   Problem: Pain Managment: Goal: General experience of comfort will improve Outcome: Progressing   

## 2023-06-14 NOTE — Transfer of Care (Signed)
Immediate Anesthesia Transfer of Care Note  Patient: Amanda West  Procedure(s) Performed: INTRAMEDULLARY (IM) NAIL INTERTROCHANTERIC (Right)  Patient Location: PACU  Anesthesia Type:General  Level of Consciousness: awake, alert , and oriented  Airway & Oxygen Therapy: Patient Spontanous Breathing  Post-op Assessment: Report given to RN and Post -op Vital signs reviewed and stable  Post vital signs: Reviewed and stable  Last Vitals:  Vitals Value Taken Time  BP 98/61 06/14/23 0915  Temp    Pulse 76 06/14/23 0918  Resp 14 06/14/23 0918  SpO2 94 % 06/14/23 0918  Vitals shown include unvalidated device data.  Last Pain:  Vitals:   06/14/23 0652  TempSrc:   PainSc: 0-No pain         Complications: No notable events documented.

## 2023-06-14 NOTE — Consult Note (Signed)
Orthopaedic Trauma Service (OTS) Consult   Patient ID: MYLEENE BRANTLY MRN: [id number] DOB/AGE: 1946-03-23 77 y.o.  Reason for Consult:Right hip fracture Referring Physician: Dr. Pricilla Loveless, MD [facility]  HPI: KEYONTA KLEHR is an 77 y.o. female who is being seen in consultation at the request of Dr. Criss Alvine for evaluation of right hip fracture.  The patient was at home where she tripped stumbled and fell landing on concrete having her right hip broken.  She presented to [facility] emergency room where x-rays showed a right intertrochanteric femur fracture.  Orthopedics was consulted.  Patient was seen and evaluated in the preoperative holding area.  Currently having pain in her right thigh some pain below her knee but denies any pain anywhere else.  At baseline she is ambulatory without assist device.  She did have some issues where she had some gait abnormalities due to dizziness/head issues which has nearly resolved.  She does not ambulate with an assist device.  She lives at home with her husband.  She denies any significant medical history and denies any blood thinners.  Past Medical History:  Diagnosis Date   Abnormality of gait    previous was seen by neurology-- dr c. Anne Hahn--- relased lov 08-31-2019, much improved per pt   Arthritis    Chronic constipation    Cornea scar    right eye   Eyes sensitive to light, bilateral    per pt right > left due to residual shingles    IDA (iron deficiency anemia)    Neuromuscular disorder (HCC)    Neuropathy   Osteoporosis    Spinal stenosis in cervical region 09/15/2014   Subcutaneous mass    bilateral abdominal    Vitamin D deficiency    Wears glasses     Past Surgical History:  Procedure Laterality Date   ABDOMINAL HERNIA REPAIR  1980s   ANTERIOR CERVICAL DECOMP/DISCECTOMY FUSION N/A 09/21/2017   Procedure: Anterior Cervical Decompression Fusion Cervical Four-Five,Cervical Five-Six,Cervical Six-Seven;  Surgeon: Julio Sicks,  MD;  Location: [facility] OR;  Service: Neurosurgery;  Laterality: N/A;  anterior    ANTERIOR CERVICAL DECOMP/DISCECTOMY FUSION N/A 05/27/2022   Procedure: CERVICAL THREE-FOUR ANTERIOR CERVICAL DECOMPRESSION/DISCECTOMY FUSION;  Surgeon: Julio Sicks, MD;  Location: [facility] OR;  Service: Neurosurgery;  Laterality: N/A;  3C   BACK SURGERY     CATARACT EXTRACTION W/ INTRAOCULAR LENS  IMPLANT, BILATERAL  1990's   CERVICAL THREE-FOUR ANTERIOR CERVICAL DECOMPRESSION/DISCECTOMY FUSION     MASS EXCISION N/A 12/19/2020   Procedure: EXCISION OF RIGHT LOWER ABDOMEN SUBCUTANEOUS MASS, EXCISION OF LEFT LOWER ABDOMEN SUBCUTANEOUS MASS;  Surgeon: Quentin Ore, MD;  Location: [facility];  Service: General;  Laterality: N/A;   PARS PLANA VITRECTOMY  07/06/2012   Procedure: PARS PLANA VITRECTOMY WITH 25G REMOVAL/SUTURE INTRAOCULAR LENS;  Surgeon: Sherrie George, MD;  Location: [facility] OR;  Service: Ophthalmology;  Laterality: Left;   TUBAL LIGATION     VITRECTOMY AND CATARACT Right ?   IOL replaced    Family History  Problem Relation Age of Onset   Heart disease Mother    Heart disease Father    Diabetes Sister    Cancer Brother    Heart disease Brother     Social History:  reports that she quit smoking about 9 years ago. Her smoking use included cigarettes. She has never used smokeless tobacco. She reports that she does not drink alcohol and does not use drugs.  Allergies:  Allergies  Allergen Reactions  Codeine Nausea Only   Pamelor [Nortriptyline]     Blurred vision    Medications:  No current facility-administered medications on file prior to encounter.   Current Outpatient Medications on File Prior to Encounter  Medication Sig Dispense Refill   APPLE CIDER VINEGAR PO Take 500 mg by mouth every evening.     Calcium-Magnesium-Zinc (CAL-MAG-ZINC PO) Take 1 tablet by mouth in the morning.     Capsicum, Cayenne, (CAYENNE PEPPER PO) Take 455 mg by mouth daily.     carboxymethylcellulose  (REFRESH PLUS) 0.5 % SOLN Place 1 drop into both eyes 2 (two) times daily as needed (for dry eye).     Cholecalciferol (VITAMIN D) 50 MCG (2000 UT) tablet Take 2,000 Units by mouth daily.     Cinnamon 500 MG capsule Take 1,000 mg by mouth in the morning.     COLLAGEN PO Take 500 mg by mouth every evening.     DULoxetine (CYMBALTA) 20 MG capsule TAKE 1 CAPSULE BY MOUTH ONCE DAILY (Patient taking differently: Take 20 mg by mouth every other day. At bedtime.) 90 capsule 3   GREEN TEA, CAMELLIA SINENSIS, PO Take 1 capsule by mouth in the morning.     MELATONIN PO Take 25 mg by mouth at bedtime as needed (sleep).     Potassium 99 MG TABS Take 99 mg by mouth every evening.     senna (SENOKOT) 8.6 MG tablet Take 4 tablets by mouth at bedtime.     HYDROcodone-acetaminophen (NORCO/VICODIN) 5-325 MG tablet Take 1 tablet by mouth every 4 (four) hours as needed for moderate pain ((score 4 to 6)). 30 tablet 0   methocarbamol (ROBAXIN) 500 MG tablet Take 1 tablet (500 mg total) by mouth every 6 (six) hours as needed for muscle spasms. 30 tablet 1   Pediatric Multivitamins-Iron (FLINTSTONES PLUS IRON PO) Take 1 tablet by mouth in the morning.     TURMERIC PO Take 1 capsule by mouth daily.     vitamin B-12 (CYANOCOBALAMIN) 1000 MCG tablet Take 1,000 mcg by mouth in the morning.     vitamin C (ASCORBIC ACID) 500 MG tablet Take 500 mg by mouth in the morning.     zinc gluconate 50 MG tablet Take 50 mg by mouth daily.       ROS: Constitutional: No fever or chills Vision: No changes in vision ENT: No difficulty swallowing CV: No chest pain Pulm: No SOB or wheezing GI: No nausea or vomiting GU: No urgency or inability to hold urine Skin: No poor wound healing Neurologic: No numbness or tingling Psychiatric: No depression or anxiety Heme: No bruising Allergic: No reaction to medications or food   Exam: Blood pressure 108/82, pulse 80, temperature 98.2 F (36.8 C), temperature source Oral, resp. rate 18,  height 5' 2.52" (1.588 m), weight 51.7 kg, SpO2 96 %. General: No acute distress Orientation: Awake alert and oriented x 3 Mood and Affect: Cooperative and pleasant Gait: Unable to assess due to her fracture Coordination and balance: Within normal limits  Right lower extremity: Buck's traction is in place.  Compartments are soft compressible.  She has active dorsiflexion plantarflexion of her foot and ankle.  She has a sensation intact to light touch to all nerve distributions.  She is warm well-perfused foot with 2+ DP pulses.  Her thigh compartments are soft compressible.  No skin lesions noted.  Left lower extremity: Skin without lesions. No tenderness to palpation. Full painless ROM, full strength in each muscle groups without  evidence of instability.   Medical Decision Making: Data: Imaging: X-rays of the right hip/femur show a right intertrochanteric femur fracture with extension into the subtrochanteric region.  Labs:  Results for orders placed or performed during the hospital encounter of 06/13/23 (from the past 24 hour(s))  Basic metabolic panel     Status: Abnormal   Collection Time: 06/13/23  8:22 PM  Result Value Ref Range   Sodium 138 135 - 145 mmol/L   Potassium 3.8 3.5 - 5.1 mmol/L   Chloride 104 98 - 111 mmol/L   CO2 26 22 - 32 mmol/L   Glucose, Bld 116 (H) 70 - 99 mg/dL   BUN 11 8 - 23 mg/dL   Creatinine, Ser 2.95 0.44 - 1.00 mg/dL   Calcium 8.6 (L) 8.9 - 10.3 mg/dL   GFR, Estimated >62 >13 mL/min   Anion gap 8 5 - 15  CBC with Differential     Status: Abnormal   Collection Time: 06/13/23  8:22 PM  Result Value Ref Range   WBC 3.7 (L) 4.0 - 10.5 K/uL   RBC 3.19 (L) 3.87 - 5.11 MIL/uL   Hemoglobin 10.8 (L) 12.0 - 15.0 g/dL   HCT 08.6 (L) 57.8 - 46.9 %   MCV 102.5 (H) 80.0 - 100.0 fL   MCH 33.9 26.0 - 34.0 pg   MCHC 33.0 30.0 - 36.0 g/dL   RDW 62.9 52.8 - 41.3 %   Platelets 185 150 - 400 K/uL   nRBC 0.0 0.0 - 0.2 %   Neutrophils Relative % 57 %   Neutro Abs  2.2 1.7 - 7.7 K/uL   Lymphocytes Relative 33 %   Lymphs Abs 1.2 0.7 - 4.0 K/uL   Monocytes Relative 7 %   Monocytes Absolute 0.3 0.1 - 1.0 K/uL   Eosinophils Relative 2 %   Eosinophils Absolute 0.1 0.0 - 0.5 K/uL   Basophils Relative 1 %   Basophils Absolute 0.0 0.0 - 0.1 K/uL   Immature Granulocytes 0 %   Abs Immature Granulocytes 0.01 0.00 - 0.07 K/uL  Protime-INR     Status: None   Collection Time: 06/13/23  8:22 PM  Result Value Ref Range   Prothrombin Time 14.4 11.4 - 15.2 seconds   INR 1.1 0.8 - 1.2  Type and screen [facility]     Status: None   Collection Time: 06/13/23  8:22 PM  Result Value Ref Range   ABO/RH(D) A POS    Antibody Screen NEG    Sample Expiration      06/16/2023,2359 Performed at [facility], [address] [place], [place] [address]   ABO/Rh     Status: None   Collection Time: 06/13/23  8:45 PM  Result Value Ref Range   ABO/RH(D)      A POS Performed at [facility], [address] [place], [place] [address]   Surgical pcr screen     Status: None   Collection Time: 06/14/23 12:11 AM   Specimen: Nasal Mucosa; Nasal Swab  Result Value Ref Range   MRSA, PCR NEGATIVE NEGATIVE   Staphylococcus aureus NEGATIVE NEGATIVE  CBC     Status: Abnormal   Collection Time: 06/14/23  2:58 AM  Result Value Ref Range   WBC 7.8 4.0 - 10.5 K/uL   RBC 2.88 (L) 3.87 - 5.11 MIL/uL   Hemoglobin 9.5 (L) 12.0 - 15.0 g/dL   HCT 36.6 (L) 44.0 - 34.7 %   MCV 100.3 (  H) 80.0 - 100.0 fL   MCH 33.0 26.0 - 34.0 pg   MCHC 32.9 30.0 - 36.0 g/dL   RDW 16.1 09.6 - 04.5 %   Platelets 173 150 - 400 K/uL   nRBC 0.0 0.0 - 0.2 %  Basic metabolic panel     Status: Abnormal   Collection Time: 06/14/23  2:58 AM  Result Value Ref Range   Sodium 137 135 - 145 mmol/L   Potassium 3.7 3.5 - 5.1 mmol/L   Chloride 103 98 - 111 mmol/L   CO2 27 22 - 32 mmol/L   Glucose, Bld 116 (H) 70 - 99 mg/dL   BUN 11 8 - 23 mg/dL   Creatinine, Ser 4.09 0.44 - 1.00  mg/dL   Calcium 8.6 (L) 8.9 - 10.3 mg/dL   GFR, Estimated >81 >19 mL/min   Anion gap 7 5 - 15  Folate     Status: None   Collection Time: 06/14/23  2:58 AM  Result Value Ref Range   Folate 34.5 >5.9 ng/mL  Vitamin B12     Status: Abnormal   Collection Time: 06/14/23  2:58 AM  Result Value Ref Range   Vitamin B-12 2,856 (H) 180 - 914 pg/mL     Imaging or Labs ordered: None  Medical history and chart was reviewed and case discussed with medical provider.  Assessment/Plan: 77 year old female with a right intertrochanteric femur fracture.  Due to the unstable nature of her injury I recommend proceeding with cephalomedullary nailing of the right hip.  Risks and benefits were discussed with the patient.  Risks included but not limited to bleeding, infection, malunion, nonunion, hardware failure, hardware irritation, nerve or blood vessel injury, DVT, even the possibility anesthetic complications.  She agrees to proceed with surgery and consent was obtained.  Roby Lofts, MD [facility] [phone] (office) [email or web address]

## 2023-06-14 NOTE — Interval H&P Note (Signed)
History and Physical Interval Note:  06/14/2023 7:26 AM  Amanda West  has presented today for surgery, with the diagnosis of right intertrochanteric femur fracture.  The various methods of treatment have been discussed with the patient and family. After consideration of risks, benefits and other options for treatment, the patient has consented to  Procedure(s): INTRAMEDULLARY (IM) NAIL INTERTROCHANTERIC (Right) as a surgical intervention.  The patient's history has been reviewed, patient examined, no change in status, stable for surgery.  I have reviewed the patient's chart and labs.  Questions were answered to the patient's satisfaction.     Caryn Bee P Leslieanne Cobarrubias

## 2023-06-14 NOTE — Plan of Care (Signed)
  Problem: Education: Goal: Knowledge of General Education information will improve Description: Including pain rating scale, medication(s)/side effects and non-pharmacologic comfort measures Outcome: Progressing   Problem: Health Behavior/Discharge Planning: Goal: Ability to manage health-related needs will improve Outcome: Progressing   Problem: Activity: Goal: Risk for activity intolerance will decrease Outcome: Progressing   

## 2023-06-14 NOTE — H&P (View-Only) (Signed)
Orthopaedic Trauma Service (OTS) Consult   Patient ID: Amanda West MRN: [id number] DOB/AGE: 78/30/1947 76 y.o.  Reason for Consult:Right hip fracture Referring Physician: Dr. Scott Goldston, MD [facility]  HPI: Amanda West is an 76 y.o. female who is being seen in consultation at the request of Dr. Goldston for evaluation of right hip fracture.  The patient was at home where she tripped stumbled and fell landing on concrete having her right hip broken.  She presented to  emergency room where x-rays showed a right intertrochanteric femur fracture.  Orthopedics was consulted.  Patient was seen and evaluated in the preoperative holding area.  Currently having pain in her right thigh some pain below her knee but denies any pain anywhere else.  At baseline she is ambulatory without assist device.  She did have some issues where she had some gait abnormalities due to dizziness/head issues which has nearly resolved.  She does not ambulate with an assist device.  She lives at home with her husband.  She denies any significant medical history and denies any blood thinners.  Past Medical History:  Diagnosis Date   Abnormality of gait    previous was seen by neurology-- dr c. willis--- relased lov 08-31-2019, much improved per pt   Arthritis    Chronic constipation    Cornea scar    right eye   Eyes sensitive to light, bilateral    per pt right > left due to residual shingles    IDA (iron deficiency anemia)    Neuromuscular disorder (HCC)    Neuropathy   Osteoporosis    Spinal stenosis in cervical region 09/15/2014   Subcutaneous mass    bilateral abdominal    Vitamin D deficiency    Wears glasses     Past Surgical History:  Procedure Laterality Date   ABDOMINAL HERNIA REPAIR  1980s   ANTERIOR CERVICAL DECOMP/DISCECTOMY FUSION N/A 09/21/2017   Procedure: Anterior Cervical Decompression Fusion Cervical Four-Five,Cervical Five-Six,Cervical Six-Seven;  Surgeon: Pool, Henry,  MD;  Location: [facility] OR;  Service: Neurosurgery;  Laterality: N/A;  anterior    ANTERIOR CERVICAL DECOMP/DISCECTOMY FUSION N/A 05/27/2022   Procedure: CERVICAL THREE-FOUR ANTERIOR CERVICAL DECOMPRESSION/DISCECTOMY FUSION;  Surgeon: Pool, Henry, MD;  Location: [facility] OR;  Service: Neurosurgery;  Laterality: N/A;  3C   BACK SURGERY     CATARACT EXTRACTION W/ INTRAOCULAR LENS  IMPLANT, BILATERAL  1990's   CERVICAL THREE-FOUR ANTERIOR CERVICAL DECOMPRESSION/DISCECTOMY FUSION     MASS EXCISION N/A 12/19/2020   Procedure: EXCISION OF RIGHT LOWER ABDOMEN SUBCUTANEOUS MASS, EXCISION OF LEFT LOWER ABDOMEN SUBCUTANEOUS MASS;  Surgeon: Stechschulte, Paul J, MD;  Location: [facility];  Service: General;  Laterality: N/A;   PARS PLANA VITRECTOMY  07/06/2012   Procedure: PARS PLANA VITRECTOMY WITH 25G REMOVAL/SUTURE INTRAOCULAR LENS;  Surgeon: John D Matthews, MD;  Location: [facility] OR;  Service: Ophthalmology;  Laterality: Left;   TUBAL LIGATION     VITRECTOMY AND CATARACT Right ?   IOL replaced    Family History  Problem Relation Age of Onset   Heart disease Mother    Heart disease Father    Diabetes Sister    Cancer Brother    Heart disease Brother     Social History:  reports that she quit smoking about 9 years ago. Her smoking use included cigarettes. She has never used smokeless tobacco. She reports that she does not drink alcohol and does not use drugs.  Allergies:  Allergies  Allergen Reactions     Codeine Nausea Only   Pamelor [Nortriptyline]     Blurred vision    Medications:  No current facility-administered medications on file prior to encounter.   Current Outpatient Medications on File Prior to Encounter  Medication Sig Dispense Refill   APPLE CIDER VINEGAR PO Take 500 mg by mouth every evening.     Calcium-Magnesium-Zinc (CAL-MAG-ZINC PO) Take 1 tablet by mouth in the morning.     Capsicum, Cayenne, (CAYENNE PEPPER PO) Take 455 mg by mouth daily.     carboxymethylcellulose  (REFRESH PLUS) 0.5 % SOLN Place 1 drop into both eyes 2 (two) times daily as needed (for dry eye).     Cholecalciferol (VITAMIN D) 50 MCG (2000 UT) tablet Take 2,000 Units by mouth daily.     Cinnamon 500 MG capsule Take 1,000 mg by mouth in the morning.     COLLAGEN PO Take 500 mg by mouth every evening.     DULoxetine (CYMBALTA) 20 MG capsule TAKE 1 CAPSULE BY MOUTH ONCE DAILY (Patient taking differently: Take 20 mg by mouth every other day. At bedtime.) 90 capsule 3   GREEN TEA, CAMELLIA SINENSIS, PO Take 1 capsule by mouth in the morning.     MELATONIN PO Take 25 mg by mouth at bedtime as needed (sleep).     Potassium 99 MG TABS Take 99 mg by mouth every evening.     senna (SENOKOT) 8.6 MG tablet Take 4 tablets by mouth at bedtime.     HYDROcodone-acetaminophen (NORCO/VICODIN) 5-325 MG tablet Take 1 tablet by mouth every 4 (four) hours as needed for moderate pain ((score 4 to 6)). 30 tablet 0   methocarbamol (ROBAXIN) 500 MG tablet Take 1 tablet (500 mg total) by mouth every 6 (six) hours as needed for muscle spasms. 30 tablet 1   Pediatric Multivitamins-Iron (FLINTSTONES PLUS IRON PO) Take 1 tablet by mouth in the morning.     TURMERIC PO Take 1 capsule by mouth daily.     vitamin B-12 (CYANOCOBALAMIN) 1000 MCG tablet Take 1,000 mcg by mouth in the morning.     vitamin C (ASCORBIC ACID) 500 MG tablet Take 500 mg by mouth in the morning.     zinc gluconate 50 MG tablet Take 50 mg by mouth daily.       ROS: Constitutional: No fever or chills Vision: No changes in vision ENT: No difficulty swallowing CV: No chest pain Pulm: No SOB or wheezing GI: No nausea or vomiting GU: No urgency or inability to hold urine Skin: No poor wound healing Neurologic: No numbness or tingling Psychiatric: No depression or anxiety Heme: No bruising Allergic: No reaction to medications or food   Exam: Blood pressure 108/82, pulse 80, temperature 98.2 F (36.8 C), temperature source Oral, resp. rate 18,  height 5' 2.52" (1.588 m), weight 51.7 kg, SpO2 96 %. General: No acute distress Orientation: Awake alert and oriented x 3 Mood and Affect: Cooperative and pleasant Gait: Unable to assess due to her fracture Coordination and balance: Within normal limits  Right lower extremity: Buck's traction is in place.  Compartments are soft compressible.  She has active dorsiflexion plantarflexion of her foot and ankle.  She has a sensation intact to light touch to all nerve distributions.  She is warm well-perfused foot with 2+ DP pulses.  Her thigh compartments are soft compressible.  No skin lesions noted.  Left lower extremity: Skin without lesions. No tenderness to palpation. Full painless ROM, full strength in each muscle groups without   evidence of instability.   Medical Decision Making: Data: Imaging: X-rays of the right hip/femur show a right intertrochanteric femur fracture with extension into the subtrochanteric region.  Labs:  Results for orders placed or performed during the hospital encounter of 06/13/23 (from the past 24 hour(s))  Basic metabolic panel     Status: Abnormal   Collection Time: 06/13/23  8:22 PM  Result Value Ref Range   Sodium 138 135 - 145 mmol/L   Potassium 3.8 3.5 - 5.1 mmol/L   Chloride 104 98 - 111 mmol/L   CO2 26 22 - 32 mmol/L   Glucose, Bld 116 (H) 70 - 99 mg/dL   BUN 11 8 - 23 mg/dL   Creatinine, Ser 0.66 0.44 - 1.00 mg/dL   Calcium 8.6 (L) 8.9 - 10.3 mg/dL   GFR, Estimated >60 >60 mL/min   Anion gap 8 5 - 15  CBC with Differential     Status: Abnormal   Collection Time: 06/13/23  8:22 PM  Result Value Ref Range   WBC 3.7 (L) 4.0 - 10.5 K/uL   RBC 3.19 (L) 3.87 - 5.11 MIL/uL   Hemoglobin 10.8 (L) 12.0 - 15.0 g/dL   HCT 32.7 (L) 36.0 - 46.0 %   MCV 102.5 (H) 80.0 - 100.0 fL   MCH 33.9 26.0 - 34.0 pg   MCHC 33.0 30.0 - 36.0 g/dL   RDW 12.8 11.5 - 15.5 %   Platelets 185 150 - 400 K/uL   nRBC 0.0 0.0 - 0.2 %   Neutrophils Relative % 57 %   Neutro Abs  2.2 1.7 - 7.7 K/uL   Lymphocytes Relative 33 %   Lymphs Abs 1.2 0.7 - 4.0 K/uL   Monocytes Relative 7 %   Monocytes Absolute 0.3 0.1 - 1.0 K/uL   Eosinophils Relative 2 %   Eosinophils Absolute 0.1 0.0 - 0.5 K/uL   Basophils Relative 1 %   Basophils Absolute 0.0 0.0 - 0.1 K/uL   Immature Granulocytes 0 %   Abs Immature Granulocytes 0.01 0.00 - 0.07 K/uL  Protime-INR     Status: None   Collection Time: 06/13/23  8:22 PM  Result Value Ref Range   Prothrombin Time 14.4 11.4 - 15.2 seconds   INR 1.1 0.8 - 1.2  Type and screen [facility]     Status: None   Collection Time: 06/13/23  8:22 PM  Result Value Ref Range   ABO/RH(D) A POS    Antibody Screen NEG    Sample Expiration      06/16/2023,2359 Performed at [facility], [address] [place], [place] [address]   ABO/Rh     Status: None   Collection Time: 06/13/23  8:45 PM  Result Value Ref Range   ABO/RH(D)      A POS Performed at [facility], [address] , [place] [address]   Surgical pcr screen     Status: None   Collection Time: 06/14/23 12:11 AM   Specimen: Nasal Mucosa; Nasal Swab  Result Value Ref Range   MRSA, PCR NEGATIVE NEGATIVE   Staphylococcus aureus NEGATIVE NEGATIVE  CBC     Status: Abnormal   Collection Time: 06/14/23  2:58 AM  Result Value Ref Range   WBC 7.8 4.0 - 10.5 K/uL   RBC 2.88 (L) 3.87 - 5.11 MIL/uL   Hemoglobin 9.5 (L) 12.0 - 15.0 g/dL   HCT 28.9 (L) 36.0 - 46.0 %   MCV 100.3 (  H) 80.0 - 100.0 fL   MCH 33.0 26.0 - 34.0 pg   MCHC 32.9 30.0 - 36.0 g/dL   RDW 12.8 11.5 - 15.5 %   Platelets 173 150 - 400 K/uL   nRBC 0.0 0.0 - 0.2 %  Basic metabolic panel     Status: Abnormal   Collection Time: 06/14/23  2:58 AM  Result Value Ref Range   Sodium 137 135 - 145 mmol/L   Potassium 3.7 3.5 - 5.1 mmol/L   Chloride 103 98 - 111 mmol/L   CO2 27 22 - 32 mmol/L   Glucose, Bld 116 (H) 70 - 99 mg/dL   BUN 11 8 - 23 mg/dL   Creatinine, Ser 0.69 0.44 - 1.00  mg/dL   Calcium 8.6 (L) 8.9 - 10.3 mg/dL   GFR, Estimated >60 >60 mL/min   Anion gap 7 5 - 15  Folate     Status: None   Collection Time: 06/14/23  2:58 AM  Result Value Ref Range   Folate 34.5 >5.9 ng/mL  Vitamin B12     Status: Abnormal   Collection Time: 06/14/23  2:58 AM  Result Value Ref Range   Vitamin B-12 2,856 (H) 180 - 914 pg/mL     Imaging or Labs ordered: None  Medical history and chart was reviewed and case discussed with medical provider.  Assessment/Plan: 76-year-old female with a right intertrochanteric femur fracture.  Due to the unstable nature of her injury I recommend proceeding with cephalomedullary nailing of the right hip.  Risks and benefits were discussed with the patient.  Risks included but not limited to bleeding, infection, malunion, nonunion, hardware failure, hardware irritation, nerve or blood vessel injury, DVT, even the possibility anesthetic complications.  She agrees to proceed with surgery and consent was obtained.  Vencent Hauschild P. Umberto Pavek, MD [facility] [phone] (office) [email or web address]   

## 2023-06-15 ENCOUNTER — Encounter (HOSPITAL_COMMUNITY): Payer: Self-pay | Admitting: Student

## 2023-06-15 DIAGNOSIS — S72141A Displaced intertrochanteric fracture of right femur, initial encounter for closed fracture: Secondary | ICD-10-CM | POA: Diagnosis not present

## 2023-06-15 LAB — BASIC METABOLIC PANEL
Anion gap: 5 (ref 5–15)
BUN: 8 mg/dL (ref 8–23)
CO2: 26 mmol/L (ref 22–32)
Calcium: 8.1 mg/dL — ABNORMAL LOW (ref 8.9–10.3)
Chloride: 102 mmol/L (ref 98–111)
Creatinine, Ser: 0.53 mg/dL (ref 0.44–1.00)
GFR, Estimated: >60 mL/min (ref >60)
Glucose, Bld: 113 mg/dL — ABNORMAL HIGH (ref 70–99)
Potassium: 4 mmol/L (ref 3.5–5.1)
Sodium: 133 mmol/L — ABNORMAL LOW (ref 135–145)

## 2023-06-15 LAB — CBC
HCT: 19.1 % — ABNORMAL LOW (ref 36.0–46.0)
Hemoglobin: 6.3 g/dL — CL (ref 12.0–15.0)
MCH: 33.3 pg (ref 26.0–34.0)
MCHC: 33 g/dL (ref 30.0–36.0)
MCV: 101.1 fL — ABNORMAL HIGH (ref 80.0–100.0)
Platelets: 116 10*3/uL — ABNORMAL LOW (ref 150–400)
RBC: 1.89 MIL/uL — ABNORMAL LOW (ref 3.87–5.11)
RDW: 12.8 % (ref 11.5–15.5)
WBC: 7.1 10*3/uL (ref 4.0–10.5)
nRBC: 0 % (ref 0.0–0.2)

## 2023-06-15 LAB — TYPE AND SCREEN

## 2023-06-15 LAB — HEMOGLOBIN AND HEMATOCRIT, BLOOD
HCT: 24.6 % — ABNORMAL LOW (ref 36.0–46.0)
Hemoglobin: 8.1 g/dL — ABNORMAL LOW (ref 12.0–15.0)

## 2023-06-15 LAB — PREPARE RBC (CROSSMATCH)

## 2023-06-15 LAB — BPAM RBC: Unit Type and Rh: 6200

## 2023-06-15 MED ORDER — ENOXAPARIN SODIUM 40 MG/0.4ML IJ SOSY: 40.0000 mg | PREFILLED_SYRINGE | INTRAMUSCULAR | Status: DC

## 2023-06-15 MED ORDER — SODIUM CHLORIDE 0.9% IV SOLUTION: Freq: Once | INTRAVENOUS | Status: AC

## 2023-06-15 MED ORDER — ADULT MULTIVITAMIN W/MINERALS CH
1.0000 | ORAL_TABLET | Freq: Every day | ORAL | Status: DC
  Administered 2023-06-15 – 2023-06-18 (×4): 1 via ORAL
  Filled 2023-06-15 (×4): qty 1

## 2023-06-15 NOTE — Progress Notes (Signed)
Initial Nutrition Assessment  DOCUMENTATION CODES:   Non-severe (moderate) malnutrition in context of social or environmental circumstances  INTERVENTION:  Encourage adequate PO intake Continue regular diet as ordered [employer] Essentials TID, each packet mixed with 8 ounces of 2% milk provides 13 grams of protein and 260 calories (chocolate) MVI with minerals daily  NUTRITION DIAGNOSIS:   Moderate Malnutrition related to social / environmental circumstances as evidenced by moderate fat depletion, moderate muscle depletion, mild muscle depletion.  GOAL:   Patient will meet greater than or equal to 90% of their needs   MONITOR:   PO intake, Supplement acceptance, Labs, Weight trends  REASON FOR ASSESSMENT:   Consult Hip fracture protocol  ASSESSMENT:   Pt admitted with comminuted intertrochanteric fracture of proximal R femur after a fall. PMH significant for cervical spinal stenosis s/p C3-4 ACDF   7/7 - s/p cephalo medullary nailing of R femur fracture  Spoke with pt at bedside. She reports chronically eating small portions. She recalls eating a high protein breakfast and dinner. She prefers to eat her calories/protein but will sometimes have a protein bar or add protein powder to a smoothie with coconut milk. Her dietary recall includes cottage cheese, peaches or other fruit and granola. Dinner is a variation of chicken, hamburger, beans and mixed vegetables. She does not like ensure but is agreeable to carnation instant breakfast with 2% milk.   She denies difficulty chewing/swallowing foods. Her home vitamin regimen includes magnesium, vitamin c, zinc, iron, and cinnamon.  No documented meal completions on file to review.    Pt weighs herself daily. Her weight has remained stable between  112-115 lbs. She mentions her doctor has encouraged weight gain but does not like the way she feels when she weighs more.   Unfortunately, there is limited weight history on  file to reviewed within the last year. However weights on file in 2023 remained fairly stable with some fluctuation between 52-54 kg.   Medications: colace, IV NaCl @ 71ml/hr  Labs: sodium 133, Hgb 6.3  NUTRITION - FOCUSED PHYSICAL EXAM: Although pt reports eating at her baseline, observed muscle and subcutaneous fat depletions are suggestive of a period of undernutrition.  Flowsheet Row Most Recent Value  Orbital Region Mild depletion  Upper Arm Region Moderate depletion  Thoracic and Lumbar Region Moderate depletion  Buccal Region No depletion  Temple Region Mild depletion  Clavicle Bone Region Severe depletion  Clavicle and Acromion Bone Region Moderate depletion  Scapular Bone Region Moderate depletion  Dorsal Hand Moderate depletion  Patellar Region Mild depletion  Anterior Thigh Region Mild depletion  Posterior Calf Region Mild depletion  Edema (RD Assessment) Mild  [BLE]  Hair Reviewed  Eyes Reviewed  Mouth Reviewed  Skin Reviewed  Nails Reviewed       Diet Order:   Diet Order             Diet regular Room service appropriate? Yes; Fluid consistency: Thin  Diet effective now                   EDUCATION NEEDS:   Education needs have been addressed  Skin:  Skin Assessment: Reviewed RN Assessment (closed incision R hip, thigh, knee)  Last BM:  7/6  Height:   Ht Readings from Last 1 Encounters:  06/14/23 5' 2.52" (1.588 m)    Weight:   Wt Readings from Last 1 Encounters:  06/14/23 51.7 kg   BMI:  Body mass index is 20.51 kg/m.  Estimated  Nutritional Needs:   Kcal:  1400-1600  Protein:  70-85g  Fluid:  >/=1.5L  Drusilla Kanner, RDN, LDN Clinical Nutrition

## 2023-06-15 NOTE — Evaluation (Signed)
Physical Therapy Evaluation Patient Details Name: Amanda West MRN: [id number] DOB: [birth year] Today's Date: 06/15/2023  History of Present Illness  Admitted after fall resulting in  right intertrochanteric femur fracture; s/p surgical fixation, WBAT;  has a past medical history of Abnormality of gait, Arthritis, Chronic constipation, Cornea scar, Eyes sensitive to light, bilateral, IDA (iron deficiency anemia), Neuromuscular disorder (HCC), Osteoporosis, Spinal stenosis in cervical region (09/15/2014), Subcutaneous mass, Vitamin D deficiency, and Wears glasses.  Clinical Impression   Pt admitted with above diagnosis. Lives at home with husband, in a single-level home with 1 steps to enter; Prior to admission, pt was independent; Presents to PT with R hip pain limiting functional mobility, BP drop in standing (though pt asymptomatic);  Needs mod assist to come to sitting EOB, mod assist to stand and take pivot steps bed to chair with RW; See vitals flow sheet for details re: BPs; Anticipate she will be able to dc home with husband assist, especially as her Hgb stabilizes; Pt currently with functional limitations due to the deficits listed below (see PT Problem List). Pt will benefit from skilled PT to increase their independence and safety with mobility to allow discharge to the venue listed below.           Assistance Recommended at Discharge Frequent or constant Supervision/Assistance  If plan is discharge home, recommend the following:  Can travel by private vehicle           Equipment Recommendations None recommended by PT (pretty well-equipped)  Recommendations for Other Services  OT consult (as ordered)    Functional Status Assessment Patient has had a recent decline in their functional status and demonstrates the ability to make significant improvements in function in a reasonable and predictable amount of time.     Precautions / Restrictions Precautions Precautions:  Fall Restrictions Weight Bearing Restrictions: Yes LLE Weight Bearing: Weight bearing as tolerated      Mobility  Bed Mobility Overal bed mobility: Needs Assistance Bed Mobility: Supine to Sit     Supine to sit: Mod assist     General bed mobility comments: Cues for technqiue; good use of LLE for half-bridge to EOB; heavy mod assist to pull to sit and square off hips at EOB; painful    Transfers Overall transfer level: Needs assistance Equipment used: Rolling walker (2 wheels) Transfers: Sit to/from Stand, Bed to chair/wheelchair/BSC Sit to Stand: Mod assist   Step pivot transfers: Mod assist       General transfer comment: Cues for hand placement, RW use to unweigh painful RLE    Ambulation/Gait                  Stairs            Wheelchair Mobility     Tilt Bed    Modified Rankin (Stroke Patients Only)       Balance     Sitting balance-Leahy Scale: Fair       Standing balance-Leahy Scale: Poor                               Pertinent Vitals/Pain Pain Assessment Pain Assessment: Faces Faces Pain Scale: Hurts whole lot Pain Location: R leg/hip/groin Pain Descriptors / Indicators: Stabbing, Spasm, Grimacing, Guarding Pain Intervention(s): Monitored during session    Home Living Family/patient expects to be discharged to:: Private residence Living Arrangements: Spouse/significant other Available Help at Discharge: Family;Available 24 hours/day Type of  Home: House Home Access: Stairs to enter   [employer] of Steps: 1   Home Layout: One level Home Equipment: Agricultural consultant (2 wheels);Cane - single point;Shower seat;Wheelchair - manual      Prior Function Prior Level of Function : Independent/Modified Independent                     Hand Dominance   Dominant Hand: Right    Extremity/Trunk Assessment   Upper Extremity Assessment Upper Extremity Assessment: Defer to OT evaluation    Lower  Extremity Assessment Lower Extremity Assessment: RLE deficits/detail RLE Deficits / Details: Grossly decr AROM and strength, limited by pain post fx and surgery RLE: Unable to fully assess due to pain    Cervical / Trunk Assessment Cervical / Trunk Assessment: Kyphotic  Communication   Communication: No difficulties  Cognition Arousal/Alertness: Awake/alert Behavior During Therapy: Anxious, WFL for tasks assessed/performed Overall Cognitive Status: Within Functional Limits for tasks assessed                                          General Comments General comments (skin integrity, edema, etc.):   06/15/23 1420 06/15/23 1422  Vital Signs  Patient Position (if appropriate) Orthostatic Vitals  --   Orthostatic Lying   BP- Lying (!) 129/97  --   Pulse- Lying 75  --   Orthostatic Sitting  BP- Sitting 122/68 120/53  Pulse- Sitting 75 72 (sitting in recliner, feet up; end of PT session)  Orthostatic Standing at 0 minutes  BP- Standing at 0 minutes 109/60  --   Pulse- Standing at 0 minutes 83  --        Exercises     Assessment/Plan    PT Assessment Patient needs continued PT services  PT Problem List Decreased strength;Decreased range of motion;Decreased activity tolerance;Decreased balance;Decreased mobility;Decreased coordination;Decreased knowledge of use of DME;Decreased safety awareness;Decreased knowledge of precautions;Pain       PT Treatment Interventions DME instruction;Gait training;Stair training;Functional mobility training;Therapeutic activities;Therapeutic exercise;Balance training;Patient/family education    PT Goals (Current goals can be found in the Care Plan section)  Acute Rehab PT Goals Patient Stated Goal: To be able to dc to home PT Goal Formulation: With patient/family Time For Goal Achievement: 06/29/23 Potential to Achieve Goals: Good    Frequency Min 6X/week     Co-evaluation               AM-PAC PT "6 Clicks"  Mobility  Outcome Measure Help needed turning from your back to your side while in a flat bed without using bedrails?: A Lot Help needed moving from lying on your back to sitting on the side of a flat bed without using bedrails?: A Lot Help needed moving to and from a bed to a chair (including a wheelchair)?: A Lot Help needed standing up from a chair using your arms (e.g., wheelchair or bedside chair)?: A Lot Help needed to walk in hospital room?: Total Help needed climbing 3-5 steps with a railing? : Total 6 Click Score: 10    End of Session Equipment Utilized During Treatment: Gait belt Activity Tolerance: Patient tolerated treatment well Patient left: in chair;with call bell/phone within reach;with family/visitor present Nurse Communication: Mobility status PT Visit Diagnosis: Unsteadiness on feet (R26.81);Pain Pain - Right/Left: Right Pain - part of body: Hip    Time: 4098-1191 PT Time Calculation (min) (ACUTE  ONLY): 42 min   Charges:   PT Evaluation $PT Eval Moderate Complexity: 1 Mod PT Treatments $Therapeutic Activity: 23-37 mins PT General Charges $$ ACUTE PT VISIT: 1 Visit         Van Clines, PT  Acute Rehabilitation Services Office [phone] Secure Chat welcomed   Levi Aland 06/15/2023, 3:57 PM

## 2023-06-15 NOTE — Progress Notes (Signed)
PROGRESS NOTE    Amanda West  ZOX:[id number] DOB: Oct 31, 1946 DOA: 06/13/2023 PCP: Lupita Raider, MD   Brief Narrative:    Amanda West is a 77 y.o. female with medical history significant for cervical spinal stenosis s/p C3-4 ACDF who is admitted with an acute right hip fracture.  Orthopedics consulted and she underwent surgical fixation 7/7.  Noted to have worsening postoperative anemia requiring PRBC transfusion today.  Assessment & Plan:   Principal Problem:   Closed comminuted intertrochanteric fracture of proximal end of right femur, initial encounter [facility]) Active Problems:   Macrocytic anemia  Assessment and Plan: Closed comminuted intertrochanteric fracture of proximal end of right femur, initial encounter [facility]) Patient right intertrochanteric femur fracture nailing 7/7 Currently without pain, but continues to complain of itching Benadryl ordered as needed  Acute anemia Status post 1 unit PRBC transfusion, recheck H/H Hold Lovenox for now Monitor CBC in a.m. No overt bleeding noted  DVT prophylaxis:Lovenox to SCDs Code Status: Full Family Communication: Husband at bedside 7/7 Disposition Plan:  Status is: Inpatient Remains inpatient appropriate because: Need for IV medications   Consultants:  Orthopedics  Procedures:  Cephalomedullary nailing of right intertrochanteric femur fracture 7/7  Antimicrobials:  Anti-infectives (From admission, onward)    Start     Dose/Rate Route Frequency Ordered Stop   06/14/23 1600  ceFAZolin (ANCEF) IVPB 2g/100 mL premix        2 g 200 mL/hr over 30 Minutes Intravenous Every 8 hours 06/14/23 1003 06/15/23 1018   06/14/23 0730  ceFAZolin (ANCEF) IVPB 2g/100 mL premix        2 g 200 mL/hr over 30 Minutes Intravenous  Once 06/14/23 0718 06/14/23 0815   06/14/23 0717  ceFAZolin (ANCEF) 2-4 GM/100ML-% IVPB       Note to Pharmacy: Crissie Sickles: cabinet override      06/14/23 0717 06/14/23 1929       Subjective: Patient seen and evaluated with improvement in symmetry noted.  She denies any other significant concerns.  Hemoglobin levels have dropped requiring PRBC transfusion.  Objective: Vitals:   06/15/23 0633 06/15/23 0648 06/15/23 0810 06/15/23 0913  BP: (!) 97/49 (!) 106/44 (!) 85/38 (!) 98/52  Pulse: 76 67 67 78  Resp: 18 18  20   Temp: 98.6 F (37 C) 98.4 F (36.9 C) 98.5 F (36.9 C) 98.5 F (36.9 C)  TempSrc: Oral Oral Oral Oral  SpO2: 99% 99% 98% 100%  Weight:      Height:        Intake/Output Summary (Last 24 hours) at 06/15/2023 1110 Last data filed at 06/15/2023 0648 Gross per 24 hour  Intake 365 ml  Output 3600 ml  Net -3235 ml   Filed Weights   06/13/23 1946 06/14/23 0652  Weight: 51.7 kg 51.7 kg    Examination:  General exam: Appears calm and comfortable  Respiratory system: Clear to auscultation. Respiratory effort normal. Cardiovascular system: S1 & S2 heard, RRR.  Gastrointestinal system: Abdomen is soft Central nervous system: Alert and awake Extremities: No edema, right lower extremity incisions C/D/I Skin: No significant lesions noted Psychiatry: Flat affect.    Data Reviewed: I have personally reviewed following labs and imaging studies  CBC: Recent Labs  Lab 06/13/23 2022 06/14/23 0258 06/15/23 0404  WBC 3.7* 7.8 7.1  NEUTROABS 2.2  --   --   HGB 10.8* 9.5* 6.3*  HCT 32.7* 28.9* 19.1*  MCV 102.5* 100.3* 101.1*  PLT 185 173 116*   Basic Metabolic  Panel: Recent Labs  Lab 06/13/23 2022 06/14/23 0258 06/15/23 0404  NA 138 137 133*  K 3.8 3.7 4.0  CL 104 103 102  CO2 26 27 26   GLUCOSE 116* 116* 113*  BUN 11 11 8   CREATININE 0.66 0.69 0.53  CALCIUM 8.6* 8.6* 8.1*   GFR: Estimated Creatinine Clearance: 47.7 mL/min (by C-G formula based on SCr of 0.53 mg/dL). Liver Function Tests: No results for input(s): "AST", "ALT", "ALKPHOS", "BILITOT", "PROT", "ALBUMIN" in the last 168 hours. No results for input(s): "LIPASE",  "AMYLASE" in the last 168 hours. No results for input(s): "AMMONIA" in the last 168 hours. Coagulation Profile: Recent Labs  Lab 06/13/23 2022  INR 1.1   Cardiac Enzymes: No results for input(s): "CKTOTAL", "CKMB", "CKMBINDEX", "TROPONINI" in the last 168 hours. BNP (last 3 results) No results for input(s): "PROBNP" in the last 8760 hours. HbA1C: No results for input(s): "HGBA1C" in the last 72 hours. CBG: No results for input(s): "GLUCAP" in the last 168 hours. Lipid Profile: No results for input(s): "CHOL", "HDL", "LDLCALC", "TRIG", "CHOLHDL", "LDLDIRECT" in the last 72 hours. Thyroid Function Tests: No results for input(s): "TSH", "T4TOTAL", "FREET4", "T3FREE", "THYROIDAB" in the last 72 hours. Anemia Panel: Recent Labs    06/14/23 0258  VITAMINB12 2,856*  FOLATE 34.5   Sepsis Labs: No results for input(s): "PROCALCITON", "LATICACIDVEN" in the last 168 hours.  Recent Results (from the past 240 hour(s))  Surgical pcr screen     Status: None   Collection Time: 06/14/23 12:11 AM   Specimen: Nasal Mucosa; Nasal Swab  Result Value Ref Range Status   MRSA, PCR NEGATIVE NEGATIVE Final   Staphylococcus aureus NEGATIVE NEGATIVE Final    Comment: (NOTE) The Xpert SA Assay (FDA approved for NASAL specimens in patients 37 years of age and older), is one component of a comprehensive surveillance program. It is not intended to diagnose infection nor to guide or monitor treatment. Performed at [facility], [address], [place], [place] [address]          Radiology Studies: DG FEMUR PORT, MIN 2 VIEWS RIGHT  Result Date: 06/14/2023 CLINICAL DATA:  Femur fracture, postop. EXAM: RIGHT FEMUR PORTABLE 2 VIEW COMPARISON:  Preoperative radiograph FINDINGS: Femoral intramedullary nail with trans trochanteric and distal locking screw fixation traverse proximal femur fracture. Improved fracture alignment from preoperative imaging. Persistent displacement of the lesser  trochanteric fracture fragment. Recent postsurgical change includes air and edema in the soft tissues. IMPRESSION: ORIF of proximal femur fracture. No immediate postoperative complication. Electronically Signed   By: Narda Rutherford M.D.   On: 06/14/2023 10:21   DG FEMUR, MIN 2 VIEWS RIGHT  Result Date: 06/14/2023 CLINICAL DATA:  Elective surgery. EXAM: RIGHT FEMUR 2 VIEWS COMPARISON:  Preoperative radiograph. FINDINGS: Six fluoroscopic spot views of the right femur obtained in the operating room. Sequential images during intramedullary nail, trans trochanteric and distal locking screw fixation of proximal femur fracture. Fluoroscopy time 89.5 seconds. Dose 5.19 mGy. IMPRESSION: Intraoperative fluoroscopy during ORIF of proximal femur fracture. Electronically Signed   By: Narda Rutherford M.D.   On: 06/14/2023 10:21   DG C-Arm 1-60 Min-No Report  Result Date: 06/14/2023 Fluoroscopy was utilized by the requesting physician.  No radiographic interpretation.   DG C-Arm 1-60 Min-No Report  Result Date: 06/14/2023 Fluoroscopy was utilized by the requesting physician.  No radiographic interpretation.   Chest Portable 1 View  Result Date: 06/13/2023 CLINICAL DATA:  PREOP EXAM: PORTABLE CHEST 1 VIEW COMPARISON:  12/20/12  FINDINGS: The heart size and mediastinal contours are within normal limits. Both lungs are clear. The visualized skeletal structures are unremarkable. IMPRESSION: No active disease. Electronically Signed   By: Deatra Robinson M.D.   On: 06/13/2023 22:14   DG Hip Unilat With Pelvis 2-3 Views Right  Result Date: 06/13/2023 CLINICAL DATA:  Status post fall. EXAM: DG HIP (WITH OR WITHOUT PELVIS) 2-3V RIGHT COMPARISON:  None Available. FINDINGS: There is an acute, comminuted fracture deformity extending through the inter trochanteric region of the proximal right femur. There is no evidence of dislocation. Moderate severity degenerative changes seen involving both hips, in the form of joint space  narrowing and acetabular sclerosis. IMPRESSION: Acute, comminuted intertrochanteric fracture of the proximal right femur. Electronically Signed   By: Aram Candela M.D.   On: 06/13/2023 21:20        Scheduled Meds:  acetaminophen  650 mg Oral Q6H   docusate sodium  100 mg Oral BID   Continuous Infusions:  sodium chloride 75 mL/hr at 06/15/23 0949   methocarbamol (ROBAXIN) IV       LOS: 2 days    Time spent: 35 minutes    Garmon Dehn Hoover Brunette, DO [facility]  If 7PM-7AM, please contact night-coverage [email or web address] 06/15/2023, 11:10 AM

## 2023-06-15 NOTE — TOC Initial Note (Addendum)
Transition of Care [facility]) - Initial/Assessment Note    Patient Details  Name: Amanda West MRN: [id number] Date of Birth: [birth year]  [facility] Care [facility]) CM/SW Contact:    Epifanio Lesches, RN Phone Number: 06/15/2023, 11:00 AM  Clinical Narrative:                   - s/p IMN R FEMUR FX  From home with wife. PTA independent with ADL's. Owns RW, BSC, W/C and cane.  PT/OT evaluations pending....  Pt agreeable to home health services if needed @ d/c. Pt without provider preference as long as provider is in network with insurance. Referral made with [place]/ [facility] and accepted pending MD 's order. Husband to provide and assist  with care with d/c to home.  Pt without transportation issues or RX med needs.  TOC team will continue monitor and assist with needs....  Expected Discharge Plan: Home w Home Health Services Barriers to Discharge: Continued Medical Work up   Patient Goals and CMS Choice     Choice offered to / list presented to : Patient      Expected Discharge Plan and Services   Discharge Planning Services: CM Consult   Living arrangements for the past 2 months: Single Family Home                           HH Arranged: PT HH Agency: [facility] Date [facility] Agency Contacted: 06/15/23 Time HH Agency Contacted: 1059 Representative spoke with at [facility] Agency: [place]  Prior Living Arrangements/Services Living arrangements for the past 2 months: Single Family Home Lives with:: Spouse Patient language and need for interpreter reviewed:: Yes Do you feel safe going back to the place where you live?: Yes      Need for Family Participation in Patient Care: Yes (Comment) Care giver support system in place?: Yes (comment)   Criminal Activity/Legal Involvement Pertinent to Current Situation/Hospitalization: No - Comment as needed  Activities of Daily Living Home Assistive Devices/Equipment: None ADL Screening (condition at time of  admission) Patient's cognitive ability adequate to safely complete daily activities?: Yes Is the patient deaf or have difficulty hearing?: No Does the patient have difficulty seeing, even when wearing glasses/contacts?: No Does the patient have difficulty concentrating, remembering, or making decisions?: No Patient able to express need for assistance with ADLs?: Yes Does the patient have difficulty dressing or bathing?: No Independently performs ADLs?: Yes (appropriate for developmental age) Does the patient have difficulty walking or climbing stairs?: No Weakness of Legs: Left (neuropathy of left foot) Weakness of Arms/Hands: None  Permission Sought/Granted      Share Information with NAME: Tawonda Triana  Spouse  [phone]           Emotional Assessment Appearance:: Appears stated age   Affect (typically observed): Accepting Orientation: : Oriented to Self, Oriented to  Time, Oriented to Place, Oriented to Situation Alcohol / Substance Use: Other (comment) Psych Involvement: No (comment)  Admission diagnosis:  Closed displaced intertrochanteric fracture of right femur, initial encounter (HCC) [S72.141A] Closed comminuted intertrochanteric fracture of proximal end of right femur, initial encounter [facility]) [S72.141A] Patient Active Problem List   Diagnosis Date Noted   Closed comminuted intertrochanteric fracture of proximal end of right femur, initial encounter (HCC) 06/13/2023   Macrocytic anemia 06/13/2023   Cervical myelopathy (HCC) 05/27/2022   Cervical spinal stenosis 09/21/2017   Spinal stenosis in cervical region 09/15/2014   Abnormality  of gait 07/22/2013   Dislocated IOL (intraocular lens), posterior 06/24/2012   PCP:  Lupita Raider, MD Pharmacy:   [facility] [address], [place] - [address] [place] [address] Phone: [phone] Fax: [phone]     Social Determinants of Health (SDOH) Social History: SDOH  Screenings   Food Insecurity: No Food Insecurity (06/13/2023)  Housing: Low Risk  (06/13/2023)  Transportation Needs: No Transportation Needs (06/13/2023)  Utilities: Not At Risk (06/13/2023)  Tobacco Use: Medium Risk (06/14/2023)   SDOH Interventions:     Readmission Risk Interventions     No data to display

## 2023-06-15 NOTE — Evaluation (Signed)
Occupational Therapy Evaluation Patient Details Name: Amanda West MRN: [id number] DOB: 1946/11/04 Today's Date: 06/15/2023   History of Present Illness Admitted after fall resulting in  right intertrochanteric femur fracture; s/p surgical fixation, WBAT;  has a past medical history of Abnormality of gait, Arthritis, Chronic constipation, Cornea scar, Eyes sensitive to light, bilateral, IDA (iron deficiency anemia), Neuromuscular disorder (HCC), Osteoporosis, Spinal stenosis in cervical region (09/15/2014), Subcutaneous mass, Vitamin D deficiency, and Wears glasses.   Clinical Impression   Patient admitted for the diagnosis above.  PTA she lives at home with her spouse, who can provide assist as needed.  Patient remarks she was very active not needing any assist with ADL,iADL or mobility.  Pain to her groin and R leg are the deficits, currently she is needing Mod A to stand, and cues with Min A to pivot to/from surfaces, and she is needing Max A for lower body ADL from a sit to stand level.  Patient would prefer to discharge home with [facility] rehab, so for now, [facility] will be recommended, but the patient will need to improve her mobility.  OT will follow in the acute setting for hip kit training and continued in room mobility/toileting.  SNF may need to be considered.        Recommendations for follow up therapy are one component of a multi-disciplinary discharge planning process, led by the attending physician.  Recommendations may be updated based on patient status, additional functional criteria and insurance authorization.   Assistance Recommended at Discharge Intermittent Supervision/Assistance  Patient can return home with the following Assist for transportation;Assistance with cooking/housework;A lot of help with bathing/dressing/bathroom;A lot of help with walking and/or transfers    Functional Status Assessment  Patient has had a recent decline in their functional status and demonstrates the  ability to make significant improvements in function in a reasonable and predictable amount of time.  Equipment Recommendations  None recommended by OT    Recommendations for Other Services       Precautions / Restrictions Precautions Precautions: Fall Restrictions Weight Bearing Restrictions: Yes LLE Weight Bearing: Weight bearing as tolerated      Mobility Bed Mobility               General bed mobility comments: up in recliner    Transfers Overall transfer level: Needs assistance Equipment used: Rolling walker (2 wheels) Transfers: Sit to/from Stand, Bed to chair/wheelchair/BSC Sit to Stand: Mod assist     Step pivot transfers: Min assist            Balance Overall balance assessment: Needs assistance Sitting-balance support: Feet supported Sitting balance-Leahy Scale: Fair     Standing balance support: Reliant on assistive device for balance Standing balance-Leahy Scale: Poor                             ADL either performed or assessed with clinical judgement   ADL       Grooming: Wash/dry hands;Wash/dry face;Set up;Sitting       Lower Body Bathing: Moderate assistance;Sit to/from stand       Lower Body Dressing: Maximal assistance;Sit to/from stand   Toilet Transfer: Moderate assistance;BSC/3in1;Stand-pivot;Rolling walker (2 wheels)                   Vision Baseline Vision/History: 1 Wears glasses Patient Visual Report: No change from baseline       Perception     Praxis  Pertinent Vitals/Pain Pain Assessment Pain Assessment: Faces Faces Pain Scale: Hurts whole lot Pain Location: R leg/hip/groin Pain Descriptors / Indicators: Stabbing, Spasm, Grimacing, Guarding Pain Intervention(s): Monitored during session     Hand Dominance Right   Extremity/Trunk Assessment Upper Extremity Assessment Upper Extremity Assessment: Overall WFL for tasks assessed   Lower Extremity Assessment Lower Extremity  Assessment: Defer to PT evaluation   Cervical / Trunk Assessment Cervical / Trunk Assessment: Kyphotic   Communication Communication Communication: No difficulties   Cognition Arousal/Alertness: Awake/alert Behavior During Therapy: Restless, Anxious Overall Cognitive Status: Within Functional Limits for tasks assessed                                       General Comments   VSS on RA    Exercises     Shoulder Instructions      Home Living Family/patient expects to be discharged to:: Private residence Living Arrangements: Spouse/significant other Available Help at Discharge: Family;Available 24 hours/day Type of Home: House Home Access: Stairs to enter     Home Layout: One level     Bathroom Shower/Tub: Producer, television/film/video: Handicapped height Bathroom Accessibility: Yes How Accessible: Accessible via walker Home Equipment: Agricultural consultant (2 wheels);Cane - single point;Shower seat;Wheelchair - manual          Prior Functioning/Environment Prior Level of Function : Independent/Modified Independent                        OT Problem List: Decreased strength;Decreased range of motion;Decreased activity tolerance;Impaired balance (sitting and/or standing);Pain      OT Treatment/Interventions: Self-care/ADL training;Therapeutic activities;Patient/family education;DME and/or AE instruction;Balance training    OT Goals(Current goals can be found in the care plan section) Acute Rehab OT Goals Patient Stated Goal: Go home OT Goal Formulation: With patient Time For Goal Achievement: 06/29/23 Potential to Achieve Goals: Fair  OT Frequency: Min 2X/week    Co-evaluation              AM-PAC OT "6 Clicks" Daily Activity     Outcome Measure Help from another person eating meals?: None Help from another person taking care of personal grooming?: None Help from another person toileting, which includes using toliet, bedpan, or  urinal?: A Lot Help from another person bathing (including washing, rinsing, drying)?: A Lot Help from another person to put on and taking off regular upper body clothing?: A Little Help from another person to put on and taking off regular lower body clothing?: A Lot 6 Click Score: 17   End of Session Equipment Utilized During Treatment: Rolling walker (2 wheels) Nurse Communication: Mobility status  Activity Tolerance: Patient limited by pain Patient left: in chair;with call bell/phone within reach  OT Visit Diagnosis: Unsteadiness on feet (R26.81);Pain Pain - Right/Left: Right Pain - part of body: Leg;Hip                Time: 8119-1478 OT Time Calculation (min): 22 min Charges:  OT General Charges $OT Visit: 1 Visit OT Evaluation $OT Eval Moderate Complexity: 1 Mod  06/15/2023  RP, OTR/L  Acute Rehabilitation Services  Office:  [phone]   Suzanna Obey 06/15/2023, 2:30 PM

## 2023-06-15 NOTE — Progress Notes (Signed)
Date and time results received: 06/15/23 0513 (use smartphrase ".now" to insert current time)  Test: Hb Critical Value: 6.3  Name of Provider Notified: Luiz Iron  Orders Received? Or Actions Taken?: Orders Received - See Orders for details

## 2023-06-15 NOTE — TOC Initial Note (Signed)
Transition of Care [facility]) - Initial/Assessment Note    Patient Details  Name: Amanda West MRN: [id number] Date of Birth: Apr 13, 1946  [facility] CM/SW Contact:    Epifanio Lesches, RN Phone Number: 06/15/2023, 3:21 PM  Clinical Narrative:                      - s/p fall / R femur fracture; s/p surgical fixation  From home alone . PTA independent with ADL's, no DME usage. States granddaughter to assist with care once d/c to home.  Pt agreeable to home health services per therapy recommendations. Pt without provider preference. Referral made with [facility] and accepted. Referral made with [place]/ [employer] for RW and BSC. Equipment will be delivered to bedside prior to d/c. Family to provide transportation to home once d/c ready.  TOC team following for needs....  Expected Discharge Plan: Home w Home Health Services Barriers to Discharge: Continued Medical Work up   Patient Goals and CMS Choice     Choice offered to / list presented to : Patient      Expected Discharge Plan and Services   Discharge Planning Services: CM Consult   Living arrangements for the past 2 months: Single Family Home                           HH Arranged: PT HH Agency: [facility] Date [facility] Agency Contacted: 06/15/23 Time HH Agency Contacted: 1059 Representative spoke with at [facility] Agency: [place]  Prior Living Arrangements/Services Living arrangements for the past 2 months: Single Family Home Lives with:: Spouse Patient language and need for interpreter reviewed:: Yes Do you feel safe going back to the place where you live?: Yes      Need for Family Participation in Patient Care: Yes (Comment) Care giver support system in place?: Yes (comment)   Criminal Activity/Legal Involvement Pertinent to Current Situation/Hospitalization: No - Comment as needed  Activities of Daily Living Home Assistive Devices/Equipment: None ADL Screening (condition at time of  admission) Patient's cognitive ability adequate to safely complete daily activities?: Yes Is the patient deaf or have difficulty hearing?: No Does the patient have difficulty seeing, even when wearing glasses/contacts?: No Does the patient have difficulty concentrating, remembering, or making decisions?: No Patient able to express need for assistance with ADLs?: Yes Does the patient have difficulty dressing or bathing?: No Independently performs ADLs?: Yes (appropriate for developmental age) Does the patient have difficulty walking or climbing stairs?: No Weakness of Legs: Left (neuropathy of left foot) Weakness of Arms/Hands: None  Permission Sought/Granted      Share Information with NAME: Briannia Coers  Spouse  [phone]           Emotional Assessment Appearance:: Appears stated age   Affect (typically observed): Accepting Orientation: : Oriented to Self, Oriented to  Time, Oriented to Place, Oriented to Situation Alcohol / Substance Use: Other (comment) Psych Involvement: No (comment)  Admission diagnosis:  Closed displaced intertrochanteric fracture of right femur, initial encounter (HCC) [S72.141A] Closed comminuted intertrochanteric fracture of proximal end of right femur, initial encounter [facility]) [S72.141A] Patient Active Problem List   Diagnosis Date Noted   Closed comminuted intertrochanteric fracture of proximal end of right femur, initial encounter (HCC) 06/13/2023   Macrocytic anemia 06/13/2023   Cervical myelopathy (HCC) 05/27/2022   Cervical spinal stenosis 09/21/2017   Spinal stenosis in cervical region 09/15/2014   Abnormality of gait 07/22/2013  Dislocated IOL (intraocular lens), posterior 06/24/2012   PCP:  Lupita Raider, MD Pharmacy:   [facility] [address], [place] - [address] [place] [address] Phone: [phone] Fax: [phone]     Social Determinants of Health (SDOH) Social History: SDOH  Screenings   Food Insecurity: No Food Insecurity (06/13/2023)  Housing: Low Risk  (06/13/2023)  Transportation Needs: No Transportation Needs (06/13/2023)  Utilities: Not At Risk (06/13/2023)  Tobacco Use: Medium Risk (06/15/2023)   SDOH Interventions:     Readmission Risk Interventions     No data to display

## 2023-06-15 NOTE — Progress Notes (Signed)
Orthopaedic Trauma Progress Note  SUBJECTIVE: Doing fairly well this morning.  Pain controlled at rest.  Has been utilizing Robaxin and Tylenol for pain control.  Has not required any narcotics postoperatively.  Has not been up out of bed yet since surgery.  Denies any numbness or tingling throughout the right lower extremity.  No chest pain. No SOB. No nausea/vomiting. No other complaints.  Patient lives at home with her husband, notes her daughter lives next-door.  Patient's plan is to return home at discharge.  States her husband will be able to assist her.  OBJECTIVE:  Vitals:   06/15/23 0648 06/15/23 0810  BP: (!) 106/44 (!) 85/38  Pulse: 67 67  Resp: 18   Temp: 98.4 F (36.9 C) 98.5 F (36.9 C)  SpO2: 99% 98%    General: Sitting up in bed eating breakfast, no acute distress sunglasses on due to light sensitivity Respiratory: No increased work of breathing.  Right lower extremity: Dressings clean, dry, intact.  Mildly tender over the hip and throughout the lateral thigh as expected.  No significant tenderness about the knee.  No calf tenderness.  Ankle DF/PF intact.  Endorses sensation to light touch throughout extremity.  Compartment soft compressible. + DP pulse  IMAGING: Stable post op imaging.   LABS:  Results for orders placed or performed during the hospital encounter of 06/13/23 (from the past 24 hour(s))  CBC     Status: Abnormal   Collection Time: 06/15/23  4:04 AM  Result Value Ref Range   WBC 7.1 4.0 - 10.5 K/uL   RBC 1.89 (L) 3.87 - 5.11 MIL/uL   Hemoglobin 6.3 (LL) 12.0 - 15.0 g/dL   HCT 91.4 (L) 78.2 - 95.6 %   MCV 101.1 (H) 80.0 - 100.0 fL   MCH 33.3 26.0 - 34.0 pg   MCHC 33.0 30.0 - 36.0 g/dL   RDW 21.3 08.6 - 57.8 %   Platelets 116 (L) 150 - 400 K/uL   nRBC 0.0 0.0 - 0.2 %  Basic metabolic panel     Status: Abnormal   Collection Time: 06/15/23  4:04 AM  Result Value Ref Range   Sodium 133 (L) 135 - 145 mmol/L   Potassium 4.0 3.5 - 5.1 mmol/L   Chloride  102 98 - 111 mmol/L   CO2 26 22 - 32 mmol/L   Glucose, Bld 113 (H) 70 - 99 mg/dL   BUN 8 8 - 23 mg/dL   Creatinine, Ser 4.69 0.44 - 1.00 mg/dL   Calcium 8.1 (L) 8.9 - 10.3 mg/dL   GFR, Estimated >62 >95 mL/min   Anion gap 5 5 - 15  Prepare RBC (crossmatch)     Status: None   Collection Time: 06/15/23  5:18 AM  Result Value Ref Range   Order Confirmation      ORDER PROCESSED BY BLOOD BANK Performed at [facility], [address], [place], [place] [address]     ASSESSMENT: Amanda West is a 77 y.o. female, 1 Day Post-Op s/p INTRAMEDULLARY NAIL RIGHT INTERTROCHANTERIC FEMUR FRACTURE  CV/Blood loss: Acute blood loss anemia, Hgb 6.3 this morning.  Currently receiving 1 unit PRBCs. Hemodynamically stable  PLAN: Weightbearing: WBAT RLE ROM: Okay for unrestricted hip and knee motion as tolerated Incisional and dressing care: Reinforce dressings as needed  Showering: Okay to begin showering getting incisions wet 06/17/2023 Orthopedic device(s): None  Pain management:  1. Tylenol 650 mg q 6 hours scheduled 2. Robaxin 500 mg q 6 hours  PRN 3. Oxycodone 5 mg q 4 hours PRN 4. Morphine 0.5 mg q 3 hours PRN VTE prophylaxis:  Hold Lovenox until hemoglobin stabilizes , SCDs ID:  Ancef 2gm post op Foley/Lines: Foley in place.  Remove after therapies today.  KVO IVFs Impediments to Fracture Healing: Vitamin D level 93, no additional supplementation needed Dispo: PT/OT evaluation today.  Patient would prefer discharge home with home health versus going to a SNF.  Continue to monitor CBC and start Lovenox once hemoglobin stable.  Will plan to remove dressings RLE tomorrow 06/16/2023  D/C recommendations: -Oxycodone for pain control -Eliquis 2.5 mg twice daily x 30 days for DVT prophylaxis -No additional need for Vit D supplementation  Follow - up plan: 2 weeks after discharge for wound check and repeat x-rays   Contact information:  Truitt Merle MD, Thyra Breed PA-C. After hours and  holidays please check [email or web address] for group call information for [facility]   Thompson Caul, PA-C [phone] (office) [email or web address]

## 2023-06-16 DIAGNOSIS — S72141A Displaced intertrochanteric fracture of right femur, initial encounter for closed fracture: Secondary | ICD-10-CM | POA: Diagnosis not present

## 2023-06-16 LAB — CBC
HCT: 25.2 % — ABNORMAL LOW (ref 36.0–46.0)
Hemoglobin: 8.5 g/dL — ABNORMAL LOW (ref 12.0–15.0)
MCH: 32.4 pg (ref 26.0–34.0)
MCHC: 33.7 g/dL (ref 30.0–36.0)
MCV: 96.2 fL (ref 80.0–100.0)
Platelets: 132 10*3/uL — ABNORMAL LOW (ref 150–400)
RBC: 2.62 MIL/uL — ABNORMAL LOW (ref 3.87–5.11)
RDW: 14.6 % (ref 11.5–15.5)
WBC: 6.7 10*3/uL (ref 4.0–10.5)
nRBC: 0 % (ref 0.0–0.2)

## 2023-06-16 LAB — BPAM RBC
Blood Product Expiration Date: 202407142359
ISSUE DATE / TIME: 202407080623

## 2023-06-16 LAB — BASIC METABOLIC PANEL
Anion gap: 7 (ref 5–15)
BUN: 9 mg/dL (ref 8–23)
CO2: 28 mmol/L (ref 22–32)
Calcium: 8.3 mg/dL — ABNORMAL LOW (ref 8.9–10.3)
Chloride: 103 mmol/L (ref 98–111)
Creatinine, Ser: 0.6 mg/dL (ref 0.44–1.00)
GFR, Estimated: >60 mL/min (ref >60)
Glucose, Bld: 99 mg/dL (ref 70–99)
Potassium: 3.7 mmol/L (ref 3.5–5.1)
Sodium: 138 mmol/L (ref 135–145)

## 2023-06-16 LAB — MAGNESIUM: Magnesium: 1.9 mg/dL (ref 1.7–2.4)

## 2023-06-16 LAB — TYPE AND SCREEN: Unit division: 0

## 2023-06-16 MED ORDER — LACTATED RINGERS IV BOLUS
1000.0000 mL | Freq: Once | INTRAVENOUS | Status: AC
  Administered 2023-06-16: 1000 mL via INTRAVENOUS

## 2023-06-16 MED ORDER — POLYETHYLENE GLYCOL 3350 17 G PO PACK: 17.0000 g | PACK | Freq: Every day | ORAL | 0 refills | Status: AC | PRN

## 2023-06-16 MED ORDER — CHLORHEXIDINE GLUCONATE CLOTH 2 % EX PADS
6.0000 | MEDICATED_PAD | Freq: Every day | CUTANEOUS | Status: DC
  Administered 2023-06-16 – 2023-06-18 (×3): 6 via TOPICAL

## 2023-06-16 MED ORDER — APIXABAN 2.5 MG PO TABS: 2.5000 mg | ORAL_TABLET | Freq: Two times a day (BID) | ORAL | 0 refills | Status: DC

## 2023-06-16 MED ORDER — HYDROXYZINE HCL 25 MG PO TABS: 25.0000 mg | ORAL_TABLET | Freq: Three times a day (TID) | ORAL | 0 refills | Status: DC | PRN

## 2023-06-16 MED ORDER — SODIUM CHLORIDE 0.9 % IV SOLN: INTRAVENOUS | Status: AC

## 2023-06-16 MED ORDER — OXYCODONE HCL 5 MG PO TABS: 5.0000 mg | ORAL_TABLET | ORAL | 0 refills | Status: DC | PRN

## 2023-06-16 MED ORDER — DOCUSATE SODIUM 100 MG PO CAPS: 100.0000 mg | ORAL_CAPSULE | Freq: Two times a day (BID) | ORAL | 0 refills | Status: AC

## 2023-06-16 MED ORDER — METHOCARBAMOL 500 MG PO TABS: 500.0000 mg | ORAL_TABLET | Freq: Four times a day (QID) | ORAL | 0 refills | Status: DC | PRN

## 2023-06-16 NOTE — Progress Notes (Signed)
Physical Therapy Treatment Patient Details Name: Amanda West MRN: [id number] DOB: 1945/12/31 Today's Date: 06/16/2023   History of Present Illness Admitted after fall resulting in  right intertrochanteric femur fracture; s/p surgical fixation, WBAT;  has a past medical history of Abnormality of gait, Arthritis, Chronic constipation, Cornea scar, Eyes sensitive to light, bilateral, IDA (iron deficiency anemia), Neuromuscular disorder (HCC), Osteoporosis, Spinal stenosis in cervical region (09/15/2014), Subcutaneous mass, Vitamin D deficiency, and Wears glasses.    PT Comments  Continuing work on functional mobility and activity tolerance;  Husband present and helpful during session, which focused on functional transfers and standing tolerance; Abiltiy to progress gait distance limited by decr upright standing tolerance; Pt reported nausea and feeling hot with standing for greater than 1 minute; sat down and obtained serial BPs, which did show a significant SBP difference of at least 20 mmHg between sitting and standing; See vitals flow sheets for details; Notified Dr. Sherryll Burger and the rest of the Team    Assistance Recommended at Discharge Frequent or constant Supervision/Assistance  If plan is discharge home, recommend the following:  Can travel by private vehicle    A lot of help with walking and/or transfers;A lot of help with bathing/dressing/bathroom;Help with stairs or ramp for entrance      Equipment Recommendations  BSC/3in1;Rolling walker (2 wheels) (youth-sized RW)    Recommendations for Other Services       Precautions / Restrictions Precautions Precautions: Fall Precaution Comments: Syncopal symptoms in standing Restrictions LLE Weight Bearing: Weight bearing as tolerated     Mobility  Bed Mobility                    Transfers Overall transfer level: Needs assistance Equipment used: Rolling walker (2 wheels) Transfers: Sit to/from Stand Sit to Stand: Min  assist, Mod assist           General transfer comment: Cues for hand placement; Cued pt to let RLE in front of her if too painful with transtions, and she opted to keep R foot even with LLE; Noatably better rise to stand with R and L feet even (instead of RLE positioned in front); stood x3, needing mod assist for third sit to stand to rise    Ambulation/Gait Ambulation/Gait assistance: Min assist, +2 safety/equipment, Mod assist (husband present for chair push) Gait Distance (Feet): 3 Feet (x2) Assistive device: Rolling walker (2 wheels) Gait Pattern/deviations: Step-to pattern       General Gait Details: Switched RW in room for a yout-sized RW, and noting better ability to [place] RLE in stance and allow for slightly smoother LLE sdvancement; occasionally needing phsyical assist ot advance RLE and LLE; gait distance limited by syncopal symptoms   Stairs             Wheelchair Mobility     Tilt Bed    Modified Rankin (Stroke Patients Only)       Balance     Sitting balance-Leahy Scale: Fair       Standing balance-Leahy Scale: Poor                              Cognition Arousal/Alertness: Awake/alert Behavior During Therapy: Anxious, WFL for tasks assessed/performed Overall Cognitive Status: Within Functional Limits for tasks assessed  Exercises      General Comments General comments (skin integrity, edema, etc.):   06/16/23 1300 06/16/23 1348  Orthostatic Sitting  BP- Sitting 118/62 124/53 (in recliner,  feet down)  Pulse- Sitting 71 69  Orthostatic Standing at 0 minutes  BP- Standing at 0 minutes 95/51 (MAP 65)  --   Pulse- Standing at 0 minutes 92  --          Pertinent Vitals/Pain Pain Assessment Pain Assessment: Faces Faces Pain Scale: Hurts even more Pain Location: R leg/hip/groin Pain Descriptors / Indicators: Stabbing, Spasm, Grimacing, Guarding Pain  Intervention(s): Monitored during session    Home Living                          Prior Function            PT Goals (current goals can now be found in the care plan section) Acute Rehab PT Goals Patient Stated Goal: To be able to dc to home PT Goal Formulation: With patient/family Time For Goal Achievement: 06/29/23 Potential to Achieve Goals: Good Progress towards PT goals: Progressing toward goals (slowly)    Frequency    Min 6X/week      PT Plan Current plan remains appropriate    Co-evaluation              AM-PAC PT "6 Clicks" Mobility   Outcome Measure  Help needed turning from your back to your side while in a flat bed without using bedrails?: A Little Help needed moving from lying on your back to sitting on the side of a flat bed without using bedrails?: A Lot Help needed moving to and from a bed to a chair (including a wheelchair)?: A Lot Help needed standing up from a chair using your arms (e.g., wheelchair or bedside chair)?: A Lot Help needed to walk in hospital room?: Total Help needed climbing 3-5 steps with a railing? : Total 6 Click Score: 11    End of Session Equipment Utilized During Treatment: Gait belt Activity Tolerance: Patient tolerated treatment well;Other (comment) (though with decr standing tolerance) Patient left: in chair;with call bell/phone within reach;with chair alarm set Nurse Communication: Mobility status;Other (comment) (and potential orthostatic hypotension) PT Visit Diagnosis: Unsteadiness on feet (R26.81);Pain Pain - Right/Left: Right Pain - part of body: Hip     Time: 1324-4010 PT Time Calculation (min) (ACUTE ONLY): 46 min  Charges:    $Gait Training: 8-22 mins $Therapeutic Activity: 23-37 mins PT General Charges $$ ACUTE PT VISIT: 1 Visit                     Van Clines, PT  Acute Rehabilitation Services Office [phone] Secure Chat welcomed    Amanda West 06/16/2023, 4:00 PM

## 2023-06-16 NOTE — Progress Notes (Signed)
Orthopaedic Trauma Progress Note  SUBJECTIVE: Doing fairly well this morning.  Pain controlled at rest.  Had increased groin and hip pain when working with therapies yesterday. Notes the muscle relaxer was helpful last night. Has been utilizing Robaxin and Tylenol for pain control.  Denies any numbness or tingling throughout the right lower extremity.  No chest pain. No SOB. No nausea/vomiting. No other complaints.    OBJECTIVE:  Vitals:   06/15/23 2016 06/16/23 0716  BP: (!) 104/53 (!) 100/45  Pulse: 73 84  Resp: 20   Temp: 98.7 F (37.1 C) 98.7 F (37.1 C)  SpO2: 100% 100%    General: Resting comfortably in bed, no acute distress sunglasses on due to light sensitivity Respiratory: No increased work of breathing.  Right lower extremity: Dressings removed, incisions clean, dry, intact.  Mildly tender over the hip and throughout the lateral thigh as expected.  No significant tenderness about the knee.  No calf tenderness.  Ankle DF/PF intact.  Endorses sensation to light touch throughout extremity.  Compartment soft compressible. + DP pulse  IMAGING: Stable post op imaging.   LABS:  Results for orders placed or performed during the hospital encounter of 06/13/23 (from the past 24 hour(s))  Hemoglobin and hematocrit, blood     Status: Abnormal   Collection Time: 06/15/23 11:28 AM  Result Value Ref Range   Hemoglobin 8.1 (L) 12.0 - 15.0 g/dL   HCT 96.2 (L) 95.2 - 84.1 %  CBC     Status: Abnormal   Collection Time: 06/16/23  5:32 AM  Result Value Ref Range   WBC 6.7 4.0 - 10.5 K/uL   RBC 2.62 (L) 3.87 - 5.11 MIL/uL   Hemoglobin 8.5 (L) 12.0 - 15.0 g/dL   HCT 32.4 (L) 40.1 - 02.7 %   MCV 96.2 80.0 - 100.0 fL   MCH 32.4 26.0 - 34.0 pg   MCHC 33.7 30.0 - 36.0 g/dL   RDW 25.3 66.4 - 40.3 %   Platelets 132 (L) 150 - 400 K/uL   nRBC 0.0 0.0 - 0.2 %  Basic metabolic panel     Status: Abnormal   Collection Time: 06/16/23  5:32 AM  Result Value Ref Range   Sodium 138 135 - 145 mmol/L    Potassium 3.7 3.5 - 5.1 mmol/L   Chloride 103 98 - 111 mmol/L   CO2 28 22 - 32 mmol/L   Glucose, Bld 99 70 - 99 mg/dL   BUN 9 8 - 23 mg/dL   Creatinine, Ser 4.74 0.44 - 1.00 mg/dL   Calcium 8.3 (L) 8.9 - 10.3 mg/dL   GFR, Estimated >25 >95 mL/min   Anion gap 7 5 - 15  Magnesium     Status: None   Collection Time: 06/16/23  5:32 AM  Result Value Ref Range   Magnesium 1.9 1.7 - 2.4 mg/dL    ASSESSMENT: Amanda West is a 77 y.o. female, 2 Days Post-Op s/p INTRAMEDULLARY NAIL RIGHT INTERTROCHANTERIC FEMUR FRACTURE  CV/Blood loss: Acute blood loss anemia, Hgb 8.4 this morning.  Received 1 unit PRBCs on 06/15/23. Hemodynamically stable  PLAN: Weightbearing: WBAT RLE ROM: Okay for unrestricted hip and knee motion as tolerated Incisional and dressing care: Leave open to air Showering: Okay to begin showering getting incisions wet 06/17/2023 Orthopedic device(s): None  Pain management:  1. Tylenol 650 mg q 6 hours scheduled 2. Robaxin 500 mg q 6 hours PRN 3. Oxycodone 5 mg q 4 hours PRN 4. Morphine 0.5 mg  q 3 hours PRN VTE prophylaxis:  Hold Lovenox until hemoglobin stabilizes , SCDs ID:  Ancef 2gm post op completed Foley/Lines: Foley in place.  Remove after therapies today.  KVO IVFs Impediments to Fracture Healing: Vitamin D level 93, no additional supplementation needed Dispo: PT/OT evaluation ongoing. Would like patient to be mobilizing better with therapies prior to discharge home.   D/C recommendations: -Oxycodone for pain control -Eliquis 2.5 mg twice daily x 30 days for DVT prophylaxis -No additional need for Vit D supplementation  Follow - up plan: 2 weeks after discharge for wound check and repeat x-rays   Contact information:  Truitt Merle MD, Thyra Breed PA-C. After hours and holidays please check [email or web address] for group call information for [facility]   Thompson Caul, PA-C [phone] (office) [email or web address]

## 2023-06-16 NOTE — Plan of Care (Signed)
  Problem: Health Behavior/Discharge Planning: Goal: Ability to manage health-related needs will improve Outcome: Progressing   Problem: Activity: Goal: Risk for activity intolerance will decrease Outcome: Progressing   Problem: Nutrition: Goal: Adequate nutrition will be maintained Outcome: Progressing   

## 2023-06-16 NOTE — Plan of Care (Signed)
  Problem: Education: Goal: Knowledge of General Education information will improve Description: Including pain rating scale, medication(s)/side effects and non-pharmacologic comfort measures Outcome: Progressing   Problem: Activity: Goal: Risk for activity intolerance will decrease Outcome: Progressing   Problem: Coping: Goal: Level of anxiety will decrease Outcome: Progressing   

## 2023-06-16 NOTE — Discharge Summary (Signed)
Physician Discharge Summary  IRISHA GRANDMAISON West:[id number] DOB: 11/17/1946 DOA: 06/13/2023  PCP: Lupita Raider, MD  Admit date: 06/13/2023  Discharge date: 06/16/2023  Admitted From:Home  Disposition:  Home  Recommendations for Outpatient Follow-up:  Follow up with PCP in 1-2 weeks, repeat CBC in 1 week Follow-up with orthopedics as requested in 2 weeks Okay to shower 7/10 Continue on Eliquis 2.5 mg twice daily for 30 days for DVT prophylaxis Oxycodone and Robaxin provided for pain management Continue bowel regimen Continue prior home medications  Home Health: Yes with PT/OT  Equipment/Devices: Equipment to be sent home  Discharge Condition:Stable  CODE STATUS: Full  Diet recommendation: Heart Healthy  Brief/Interim Summary: Amanda West is a 77 y.o. female with medical history significant for cervical spinal stenosis s/p C3-4 ACDF who is admitted with an acute right hip fracture.  Orthopedics consulted and she underwent surgical fixation 7/7.  Noted to have worsening postoperative anemia requiring PRBC transfusion on 7/8, but now has stable hemoglobin levels and is okay for discharge.  She will follow-up with orthopedics outpatient and remain on Eliquis for DVT prophylaxis.  No other acute concerns noted.  Discharge Diagnoses:  Principal Problem:   Closed comminuted intertrochanteric fracture of proximal end of right femur, initial encounter [facility]) Active Problems:   Macrocytic anemia  Principal discharge diagnosis: Closed comminuted intertrochanteric fracture of proximal right femur status post ORIF with postoperative anemia requiring 1 unit PRBC transfusion.  Discharge Instructions  Discharge Instructions     Ambulatory referral to Orthopedics   Complete by: As directed    Diet - low sodium heart healthy   Complete by: As directed    Increase activity slowly   Complete by: As directed       Allergies as of 06/16/2023       Reactions   Codeine Nausea Only    Pamelor [nortriptyline]    Blurred vision        Medication List     TAKE these medications    apixaban 2.5 MG Tabs tablet Commonly known as: Eliquis Take 1 tablet (2.5 mg total) by mouth 2 (two) times daily.   APPLE CIDER VINEGAR PO Take 500 mg by mouth every evening.   ascorbic acid 500 MG tablet Commonly known as: VITAMIN C Take 500 mg by mouth in the morning.   CAL-MAG-ZINC PO Take 1 tablet by mouth in the morning.   carboxymethylcellulose 0.5 % Soln Commonly known as: REFRESH PLUS Place 1 drop into both eyes 2 (two) times daily as needed (for dry eye).   CAYENNE PEPPER PO Take 455 mg by mouth daily.   Cinnamon 500 MG capsule Take 1,000 mg by mouth in the morning.   COLLAGEN PO Take 500 mg by mouth every evening.   cyanocobalamin 1000 MCG tablet Commonly known as: VITAMIN B12 Take 1,000 mcg by mouth in the morning.   docusate sodium 100 MG capsule Commonly known as: COLACE Take 1 capsule (100 mg total) by mouth 2 (two) times daily.   DULoxetine 20 MG capsule Commonly known as: CYMBALTA TAKE 1 CAPSULE BY MOUTH ONCE DAILY What changed:  when to take this additional instructions   GREEN TEA (CAMELLIA SINENSIS) PO Take 1 capsule by mouth in the morning.   hydrOXYzine 25 MG tablet Commonly known as: ATARAX Take 1 tablet (25 mg total) by mouth 3 (three) times daily as needed for itching.   MELATONIN PO Take 25 mg by mouth at bedtime as needed (sleep).   methocarbamol 500  MG tablet Commonly known as: ROBAXIN Take 1 tablet (500 mg total) by mouth every 6 (six) hours as needed for muscle spasms.   oxyCODONE 5 MG immediate release tablet Commonly known as: Oxy IR/ROXICODONE Take 1 tablet (5 mg total) by mouth every 4 (four) hours as needed for moderate pain, severe pain or breakthrough pain.   polyethylene glycol 17 g packet Commonly known as: MIRALAX / GLYCOLAX Take 17 g by mouth daily as needed for mild constipation.   Potassium 99 MG Tabs Take  99 mg by mouth every evening.   senna 8.6 MG tablet Commonly known as: SENOKOT Take 4 tablets by mouth at bedtime.   Vitamin D 50 MCG (2000 UT) tablet Take 2,000 Units by mouth daily.   zinc gluconate 50 MG tablet Take 50 mg by mouth daily.        Follow-up Information     Lupita Raider, MD. Schedule an appointment as soon as possible for a visit in 1 week(s).   Specialty: Family Medicine Contact information: [address] [place] [address] [phone]         Roby Lofts, MD. Schedule an appointment as soon as possible for a visit in 2 week(s).   Specialty: Orthopedic Surgery Contact information: [address] [place] [address] [phone]                Allergies  Allergen Reactions   Codeine Nausea Only   Pamelor [Nortriptyline]     Blurred vision    Consultations: Orthopedics   Procedures/Studies: DG FEMUR PORT, MIN 2 VIEWS RIGHT  Result Date: 06/14/2023 CLINICAL DATA:  Femur fracture, postop. EXAM: RIGHT FEMUR PORTABLE 2 VIEW COMPARISON:  Preoperative radiograph FINDINGS: Femoral intramedullary nail with trans trochanteric and distal locking screw fixation traverse proximal femur fracture. Improved fracture alignment from preoperative imaging. Persistent displacement of the lesser trochanteric fracture fragment. Recent postsurgical change includes air and edema in the soft tissues. IMPRESSION: ORIF of proximal femur fracture. No immediate postoperative complication. Electronically Signed   By: Narda Rutherford M.D.   On: 06/14/2023 10:21   DG FEMUR, MIN 2 VIEWS RIGHT  Result Date: 06/14/2023 CLINICAL DATA:  Elective surgery. EXAM: RIGHT FEMUR 2 VIEWS COMPARISON:  Preoperative radiograph. FINDINGS: Six fluoroscopic spot views of the right femur obtained in the operating room. Sequential images during intramedullary nail, trans trochanteric and distal locking screw fixation of proximal femur fracture. Fluoroscopy time  89.5 seconds. Dose 5.19 mGy. IMPRESSION: Intraoperative fluoroscopy during ORIF of proximal femur fracture. Electronically Signed   By: Narda Rutherford M.D.   On: 06/14/2023 10:21   DG C-Arm 1-60 Min-No Report  Result Date: 06/14/2023 Fluoroscopy was utilized by the requesting physician.  No radiographic interpretation.   DG C-Arm 1-60 Min-No Report  Result Date: 06/14/2023 Fluoroscopy was utilized by the requesting physician.  No radiographic interpretation.   Chest Portable 1 View  Result Date: 06/13/2023 CLINICAL DATA:  PREOP EXAM: PORTABLE CHEST 1 VIEW COMPARISON:  12/20/12 FINDINGS: The heart size and mediastinal contours are within normal limits. Both lungs are clear. The visualized skeletal structures are unremarkable. IMPRESSION: No active disease. Electronically Signed   By: Deatra Robinson M.D.   On: 06/13/2023 22:14   DG Hip Unilat With Pelvis 2-3 Views Right  Result Date: 06/13/2023 CLINICAL DATA:  Status post fall. EXAM: DG HIP (WITH OR WITHOUT PELVIS) 2-3V RIGHT COMPARISON:  None Available. FINDINGS: There is an acute, comminuted fracture deformity extending through the inter trochanteric region  of the proximal right femur. There is no evidence of dislocation. Moderate severity degenerative changes seen involving both hips, in the form of joint space narrowing and acetabular sclerosis. IMPRESSION: Acute, comminuted intertrochanteric fracture of the proximal right femur. Electronically Signed   By: Aram Candela M.D.   On: 06/13/2023 21:20     Discharge Exam: Vitals:   06/15/23 2016 06/16/23 0716  BP: (!) 104/53 (!) 100/45  Pulse: 73 84  Resp: 20   Temp: 98.7 F (37.1 C) 98.7 F (37.1 C)  SpO2: 100% 100%   Vitals:   06/15/23 0913 06/15/23 1433 06/15/23 2016 06/16/23 0716  BP: (!) 98/52 (!) 115/49 (!) 104/53 (!) 100/45  Pulse: 78 74 73 84  Resp: 20  20   Temp: 98.5 F (36.9 C) 98.5 F (36.9 C) 98.7 F (37.1 C) 98.7 F (37.1 C)  TempSrc: Oral Oral  Oral  SpO2: 100%  100% 100% 100%  Weight:      Height:        General: Pt is alert, awake, not in acute distress Cardiovascular: RRR, S1/S2 +, no rubs, no gallops Respiratory: CTA bilaterally, no wheezing, no rhonchi Abdominal: Soft, NT, ND, bowel sounds + Extremities: no edema, no cyanosis, right lower extremity incisions C/D/I    The results of significant diagnostics from this hospitalization (including imaging, microbiology, ancillary and laboratory) are listed below for reference.     Microbiology: Recent Results (from the past 240 hour(s))  Surgical pcr screen     Status: None   Collection Time: 06/14/23 12:11 AM   Specimen: Nasal Mucosa; Nasal Swab  Result Value Ref Range Status   MRSA, PCR NEGATIVE NEGATIVE Final   Staphylococcus aureus NEGATIVE NEGATIVE Final    Comment: (NOTE) The Xpert SA Assay (FDA approved for NASAL specimens in patients 60 years of age and older), is one component of a comprehensive surveillance program. It is not intended to diagnose infection nor to guide or monitor treatment. Performed at [facility], [address], [place], [place] [address]      Labs: BNP (last 3 results) No results for input(s): "BNP" in the last 8760 hours. Basic Metabolic Panel: Recent Labs  Lab 06/13/23 2022 06/14/23 0258 06/15/23 0404 06/16/23 0532  NA 138 137 133* 138  K 3.8 3.7 4.0 3.7  CL 104 103 102 103  CO2 26 27 26 28   GLUCOSE 116* 116* 113* 99  BUN 11 11 8 9   CREATININE 0.66 0.69 0.53 0.60  CALCIUM 8.6* 8.6* 8.1* 8.3*  MG  --   --   --  1.9   Liver Function Tests: No results for input(s): "AST", "ALT", "ALKPHOS", "BILITOT", "PROT", "ALBUMIN" in the last 168 hours. No results for input(s): "LIPASE", "AMYLASE" in the last 168 hours. No results for input(s): "AMMONIA" in the last 168 hours. CBC: Recent Labs  Lab 06/13/23 2022 06/14/23 0258 06/15/23 0404 06/15/23 1128 06/16/23 0532  WBC 3.7* 7.8 7.1  --  6.7  NEUTROABS 2.2  --   --   --   --   HGB  10.8* 9.5* 6.3* 8.1* 8.5*  HCT 32.7* 28.9* 19.1* 24.6* 25.2*  MCV 102.5* 100.3* 101.1*  --  96.2  PLT 185 173 116*  --  132*   Cardiac Enzymes: No results for input(s): "CKTOTAL", "CKMB", "CKMBINDEX", "TROPONINI" in the last 168 hours. BNP: Invalid input(s): "POCBNP" CBG: No results for input(s): "GLUCAP" in the last 168 hours. D-Dimer No results for input(s): "DDIMER" in the last 72  hours. Hgb A1c No results for input(s): "HGBA1C" in the last 72 hours. Lipid Profile No results for input(s): "CHOL", "HDL", "LDLCALC", "TRIG", "CHOLHDL", "LDLDIRECT" in the last 72 hours. Thyroid function studies No results for input(s): "TSH", "T4TOTAL", "T3FREE", "THYROIDAB" in the last 72 hours.  Invalid input(s): "FREET3" Anemia work up Recent Labs    06/14/23 0258  VITAMINB12 2,856*  FOLATE 34.5   Urinalysis    Component Value Date/Time   COLORURINE AMBER (A) 09/16/2020 0312   APPEARANCEUR TURBID (A) 09/16/2020 0312   LABSPEC 1.003 (L) 09/16/2020 0312   PHURINE 6.0 09/16/2020 0312   GLUCOSEU NEGATIVE 09/16/2020 0312   HGBUR LARGE (A) 09/16/2020 0312   BILIRUBINUR NEGATIVE 09/16/2020 0312   KETONESUR NEGATIVE 09/16/2020 0312   PROTEINUR 100 (A) 09/16/2020 0312   UROBILINOGEN 0.2 06/29/2013 2125   NITRITE NEGATIVE 09/16/2020 0312   LEUKOCYTESUR LARGE (A) 09/16/2020 0312   Sepsis Labs Recent Labs  Lab 06/13/23 2022 06/14/23 0258 06/15/23 0404 06/16/23 0532  WBC 3.7* 7.8 7.1 6.7   Microbiology Recent Results (from the past 240 hour(s))  Surgical pcr screen     Status: None   Collection Time: 06/14/23 12:11 AM   Specimen: Nasal Mucosa; Nasal Swab  Result Value Ref Range Status   MRSA, PCR NEGATIVE NEGATIVE Final   Staphylococcus aureus NEGATIVE NEGATIVE Final    Comment: (NOTE) The Xpert SA Assay (FDA approved for NASAL specimens in patients 12 years of age and older), is one component of a comprehensive surveillance program. It is not intended to diagnose infection nor  to guide or monitor treatment. Performed at [facility], [address], [place], [place] [address]      Time coordinating discharge: 35 minutes  SIGNED:   Erick Blinks, DO [facility] 06/16/2023, 10:43 AM  If 7PM-7AM, please contact night-coverage [email or web address]

## 2023-06-16 NOTE — Care Management Important Message (Signed)
Important Message  Patient Details  Name: MANHA VELTEN MRN: [id number] Date of Birth: 1946/11/26   Medicare Important Message Given:  Yes     Sherilyn Banker 06/16/2023, 11:38 AM

## 2023-06-17 DIAGNOSIS — S72141A Displaced intertrochanteric fracture of right femur, initial encounter for closed fracture: Secondary | ICD-10-CM | POA: Diagnosis not present

## 2023-06-17 DIAGNOSIS — I951 Orthostatic hypotension: Secondary | ICD-10-CM | POA: Diagnosis not present

## 2023-06-17 DIAGNOSIS — D539 Nutritional anemia, unspecified: Secondary | ICD-10-CM

## 2023-06-17 LAB — CBC
HCT: 23.5 % — ABNORMAL LOW (ref 36.0–46.0)
Hemoglobin: 7.8 g/dL — ABNORMAL LOW (ref 12.0–15.0)
MCH: 32.6 pg (ref 26.0–34.0)
MCHC: 33.2 g/dL (ref 30.0–36.0)
MCV: 98.3 fL (ref 80.0–100.0)
Platelets: 131 10*3/uL — ABNORMAL LOW (ref 150–400)
RBC: 2.39 MIL/uL — ABNORMAL LOW (ref 3.87–5.11)
RDW: 14 % (ref 11.5–15.5)
WBC: 6.5 10*3/uL (ref 4.0–10.5)
nRBC: 0 % (ref 0.0–0.2)

## 2023-06-17 NOTE — Progress Notes (Addendum)
PROGRESS NOTE    Amanda West  ZOX:[id number] DOB: 1946/05/22 DOA: 06/13/2023 PCP: Lupita Raider, MD    Brief Narrative:  Amanda West is a 77 y.o. female with medical history significant for cervical spinal stenosis s/p C3-4 ACDF who was admitted with an acute right hip fracture.  Orthopedics was consulted with  plans for surgical fixation 7/7.  Patient is discharged for surgical intervention and is undergoing physical therapy.  Patient developed orthostatic hypotension and required IV fluids with improvement in blood pressure.    Assessment and Plan: * Closed comminuted intertrochanteric fracture of proximal end of right femur, initial encounter Status post mechanical fall.  Orthopedics on board and underwent  surgical fixation on 7/7.  Currently awaiting for stabilization prior to disposition.  Has been seen by physical therapy today and was orthostatic yesterday with slight improvement today.  PT recommends 1 more day of physical therapy prior to disposition home.  Continue Eliquis for anticoagulation..  Follow-up plan with orthopedics in 2 weeks after discharge for wound check and repeat x-ray.  Orthostatic hypotension.  Improved with IV fluids.   Check orthostatic vitals tomorrow.  Macrocytic anemia Anticoagulation on hold.  Plan is Eliquis 2.5 mg twice daily for 30 days on discharge.    DVT prophylaxis: Place and maintain sequential compression device Start: 06/15/23 1110 SCDs Start: 06/14/23 1004 SCDs Start: 06/13/23 2154   Code Status:     Code Status: Full Code  Disposition: Home likely 06/18/2023 if remains stable.  Status is: Inpatient Remains inpatient appropriate because: Status post hip surgery, orthostatic hypotension, pending medical stabilization.   Family Communication: None  Consultants:  Orthopedics  Procedures:  Status post right hip IM nailing on 06/14/2023.  Antimicrobials:  None   Subjective: Today, patient was seen and examined at bedside.   Physical therapy stated that patient was orthostatic yesterday.  Patient complains of weakness and no nausea vomiting fever chills or rigor.    Objective: Vitals:   06/16/23 2114 06/16/23 2257 06/17/23 0833 06/17/23 1230  BP: (!) 109/52  (!) 101/54 (!) 109/55  Pulse: 88  69 76  Resp:   18   Temp: 100.3 F (37.9 C) 98.4 F (36.9 C) 98.4 F (36.9 C) 98.7 F (37.1 C)  TempSrc: Oral Oral Oral Oral  SpO2: 100%  98% 100%  Weight:      Height:        Intake/Output Summary (Last 24 hours) at 06/17/2023 1308 Last data filed at 06/17/2023 0600 Gross per 24 hour  Intake 840.92 ml  Output 700 ml  Net 140.92 ml   Filed Weights   06/13/23 1946 06/14/23 0652  Weight: 51.7 kg 51.7 kg    Physical Examination: Body mass index is 20.51 kg/m.   General:  Average built, not in obvious distress HENT:   No scleral pallor or icterus noted. Oral mucosa is moist.  Chest:  Clear breath sounds.  No crackles or wheezes.  CVS: S1 &S2 heard. No murmur.  Regular rate and rhythm. Abdomen: Soft, nontender, nondistended.  Bowel sounds are heard.   Extremities: No cyanosis, clubbing or edema.  Peripheral pulses are palpable.  Surgical site clean dry and intact. Psych: Alert, awake and oriented, normal mood CNS:  No cranial nerve deficits.  Moves extremities. Skin: Warm and dry.  Status post right hip surgical intervention.  Data Reviewed:   CBC: Recent Labs  Lab 06/13/23 2022 06/14/23 0258 06/15/23 0404 06/15/23 1128 06/16/23 0532 06/17/23 0947  WBC 3.7* 7.8 7.1  --  6.7 6.5  NEUTROABS 2.2  --   --   --   --   --   HGB 10.8* 9.5* 6.3* 8.1* 8.5* 7.8*  HCT 32.7* 28.9* 19.1* 24.6* 25.2* 23.5*  MCV 102.5* 100.3* 101.1*  --  96.2 98.3  PLT 185 173 116*  --  132* 131*    Basic Metabolic Panel: Recent Labs  Lab 06/13/23 2022 06/14/23 0258 06/15/23 0404 06/16/23 0532  NA 138 137 133* 138  K 3.8 3.7 4.0 3.7  CL 104 103 102 103  CO2 26 27 26 28   GLUCOSE 116* 116* 113* 99  BUN 11 11 8 9    CREATININE 0.66 0.69 0.53 0.60  CALCIUM 8.6* 8.6* 8.1* 8.3*  MG  --   --   --  1.9    Liver Function Tests: No results for input(s): "AST", "ALT", "ALKPHOS", "BILITOT", "PROT", "ALBUMIN" in the last 168 hours.   Radiology Studies: No results found.    LOS: 4 days    Joycelyn Das, MD [facility] Available via Epic secure chat 7am-7pm After these hours, please refer to coverage provider listed on [email or web address] 06/17/2023, 1:08 PM

## 2023-06-17 NOTE — Progress Notes (Signed)
Physical Therapy Treatment Patient Details Name: Amanda West MRN: [id number] DOB: 10/14/1946 Today's Date: 06/17/2023   History of Present Illness Admitted after fall resulting in  right intertrochanteric femur fracture; s/p surgical fixation, WBAT;  has a past medical history of Abnormality of gait, Arthritis, Chronic constipation, Cornea scar, Eyes sensitive to light, bilateral, IDA (iron deficiency anemia), Neuromuscular disorder (HCC), Osteoporosis, Spinal stenosis in cervical region (09/15/2014), Subcutaneous mass, Vitamin D deficiency, and Wears glasses.    PT Comments  Continuing work on functional mobility and activity tolerance;  Pt with showin gmuch improved BP response to upright activity, and was able to walk in the hallways today without bPs dropping further with incr time in standing and walking; Notified care team via secure chat; still prefer another day for pt and husband to get more ADL practice, education on exercises, and stair training done, if possible; I'm very confident she'll be solid to dc tomorrow      Assistance Recommended at Discharge Frequent or constant Supervision/Assistance  If plan is discharge home, recommend the following:  Can travel by private vehicle    A lot of help with walking and/or transfers;A lot of help with bathing/dressing/bathroom;Help with stairs or ramp for entrance      Equipment Recommendations  BSC/3in1;Rolling walker (2 wheels) (youth-sized RW)    Recommendations for Other Services       Precautions / Restrictions Precautions Precautions: Fall Precaution Comments: Syncopal symptoms in standing; 7/10: improving activity tolerance Restrictions Weight Bearing Restrictions: Yes LLE Weight Bearing: Weight bearing as tolerated     Mobility  Bed Mobility                    Transfers Overall transfer level: Needs assistance Equipment used: Rolling walker (2 wheels) Transfers: Sit to/from Stand Sit to Stand: Mod  assist           General transfer comment: Light mod assist to rise and stabilize as pt moved hands bed to RW    Ambulation/Gait Ambulation/Gait assistance: +2 safety/equipment, Min guard (husband present for chair push) Gait Distance (Feet): 105 Feet (with 2 standing stops to take BPs) Assistive device: Rolling walker (2 wheels) Gait Pattern/deviations: Step-through pattern (emerging)       General Gait Details: Smoother R and LLE advancement, noting tends to lift up into LLE tiptoe to help RLE clear the floor while stepping; cues to self-monitor for any syncopal symptoms; BPs rallied during the walk   Stairs             Wheelchair Mobility     Tilt Bed    Modified Rankin (Stroke Patients Only)       Balance     Sitting balance-Leahy Scale: Fair       Standing balance-Leahy Scale: Poor                              Cognition Arousal/Alertness: Awake/alert Behavior During Therapy: Anxious, WFL for tasks assessed/performed Overall Cognitive Status: Within Functional Limits for tasks assessed                                 General Comments: Showing good self-monitor for syncopal symptoms        Exercises Other Exercises Other Exercises: Left HEP printout in room; will need to educate in therex next session    General Comments General comments (skin integrity,  edema, etc.): See other PT note of this date      Pertinent Vitals/Pain Pain Assessment Pain Assessment: Faces Faces Pain Scale: Hurts even more Pain Location: R leg/hip/groin Pain Descriptors / Indicators: Grimacing, Guarding Pain Intervention(s): Monitored during session    Home Living                          Prior Function            PT Goals (current goals can now be found in the care plan section) Acute Rehab PT Goals Patient Stated Goal: To be able to dc to home PT Goal Formulation: With patient/family Time For Goal Achievement:  06/29/23 Potential to Achieve Goals: Good Progress towards PT goals: Progressing toward goals    Frequency    Min 6X/week      PT Plan Current plan remains appropriate    Co-evaluation              AM-PAC PT "6 Clicks" Mobility   Outcome Measure  Help needed turning from your back to your side while in a flat bed without using bedrails?: A Little Help needed moving from lying on your back to sitting on the side of a flat bed without using bedrails?: A Little Help needed moving to and from a bed to a chair (including a wheelchair)?: A Lot Help needed standing up from a chair using your arms (e.g., wheelchair or bedside chair)?: A Lot Help needed to walk in hospital room?: A Little Help needed climbing 3-5 steps with a railing? : A Lot 6 Click Score: 15    End of Session Equipment Utilized During Treatment: Gait belt Activity Tolerance: Patient tolerated treatment well Patient left: in chair;with call bell/phone within reach;with chair alarm set Nurse Communication: Mobility status;Other (comment) (serial BPs) PT Visit Diagnosis: Unsteadiness on feet (R26.81);Pain Pain - Right/Left: Right Pain - part of body: Hip     Time: 1610-9604 PT Time Calculation (min) (ACUTE ONLY): 40 min  Charges:    $Gait Training: 23-37 mins $Therapeutic Activity: 8-22 mins PT General Charges $$ ACUTE PT VISIT: 1 Visit                     Van Clines, PT  Acute Rehabilitation Services Office [phone] Secure Chat welcomed    Levi Aland 06/17/2023, 12:29 PM

## 2023-06-17 NOTE — Progress Notes (Signed)
Physical Therapy Treatment Note  Continuing work on functional mobility and activity tolerance;   (Full PT note to follow)  Took serial BPs during upright activity, while she did have a drop at initial stand, she was asymptomatic, so we pressed on; standing BPs at 6 minutes and 9 minutes showed that her body was able to regulate;   Standing BP at 9 minutes was comparable to her initial sitting BP;     06/17/23 1100 06/17/23 1103 06/17/23 1106  Orthostatic Sitting  BP- Sitting 114/54 (MAP 72)  --   --   Pulse- Sitting 78  --   --   Orthostatic Standing at 0 minutes  BP- Standing at 0 minutes 93/49 (MAP 64)  --   --   Pulse- Standing at 0 minutes 92  --   --   Orthostatic Standing at 3 minutes  BP- Standing at 3 minutes 96/56 (MAP 68) 97/67 (MAP 77) 113/59 (MAP 75)  Pulse- Standing at 3 minutes 93 90 (Standing at 6 min, paused during hallway amb) 83 (Standing at 9 min, paused during hallway amb)   Improving activity tolerance;   Van Clines, PT  Acute Rehabilitation Services Office [phone] Secure Chat welcomed

## 2023-06-17 NOTE — Progress Notes (Signed)
Patient ambulated with this nurse 161ft in the hallway.  Patient complained of minimal pain.  Only Tylenol given 1 hour prior to movement.

## 2023-06-17 NOTE — Discharge Instructions (Signed)
Orthopaedic Trauma Service Discharge Instructions   General Discharge Instructions  WEIGHT BEARING STATUS: Weightbearing as tolerated  RANGE OF MOTION/ACTIVITY: Ok for hip and knee motion as tolerated  Wound Care: Incisions can be left open to air if there is no drainage. Once the incision is completely dry and without drainage, it may be left open to air out.  Showering may begin post-op day 3, (Wednesday 06/17/23).  Clean incision gently with soap and water.  DVT/PE prophylaxis:  Eliquis 2.5 mg twice daily x 30 days  Diet: as you were eating previously.  Can use over the counter stool softeners and bowel preparations, such as Miralax, to help with bowel movements.  Narcotics can be constipating.  Be sure to drink plenty of fluids  PAIN MEDICATION USE AND EXPECTATIONS  You have likely been given narcotic medications to help control your pain.  After a traumatic event that results in an fracture (broken bone) with or without surgery, it is ok to use narcotic pain medications to help control one's pain.  We understand that everyone responds to pain differently and each individual patient will be evaluated on a regular basis for the continued need for narcotic medications. Ideally, narcotic medication use should last no more than 6-8 weeks (coinciding with fracture healing).   As a patient it is your responsibility as well to monitor narcotic medication use and report the amount and frequency you use these medications when you come to your office visit.   We would also advise that if you are using narcotic medications, you should take a dose prior to therapy to maximize you participation.  IF YOU ARE ON NARCOTIC MEDICATIONS IT IS NOT PERMISSIBLE TO OPERATE A MOTOR VEHICLE (MOTORCYCLE/CAR/TRUCK/MOPED) OR HEAVY MACHINERY DO NOT MIX NARCOTICS WITH OTHER CNS (CENTRAL NERVOUS SYSTEM) DEPRESSANTS SUCH AS ALCOHOL   STOP SMOKING OR USING NICOTINE PRODUCTS!!!!  As discussed nicotine severely impairs  your body's ability to heal surgical and traumatic wounds but also impairs bone healing.  Wounds and bone heal by forming microscopic blood vessels (angiogenesis) and nicotine is a vasoconstrictor (essentially, shrinks blood vessels).  Therefore, if vasoconstriction occurs to these microscopic blood vessels they essentially disappear and are unable to deliver necessary nutrients to the healing tissue.  This is one modifiable factor that you can do to dramatically increase your chances of healing your injury.    (This means no smoking, no nicotine gum, patches, etc)  DO NOT USE NONSTEROIDAL ANTI-INFLAMMATORY DRUGS (NSAID'S)  Using products such as Advil (ibuprofen), Aleve (naproxen), Motrin (ibuprofen) for additional pain control during fracture healing can delay and/or prevent the healing response.  If you would like to take over the counter (OTC) medication, Tylenol (acetaminophen) is ok.  However, some narcotic medications that are given for pain control contain acetaminophen as well. Therefore, you should not exceed more than 4000 mg of tylenol in a day if you do not have liver disease.  Also note that there are may OTC medicines, such as cold medicines and allergy medicines that my contain tylenol as well.  If you have any questions about medications and/or interactions please ask your doctor/PA or your pharmacist.      ICE AND ELEVATE INJURED/OPERATIVE EXTREMITY  Using ice and elevating the injured extremity above your heart can help with swelling and pain control.  Icing in a pulsatile fashion, such as 20 minutes on and 20 minutes off, can be followed.    Do not place ice directly on skin. Make sure there is  a barrier between to skin and the ice pack.    Using frozen items such as frozen peas works well as the conform nicely to the are that needs to be iced.  USE AN ACE WRAP OR TED HOSE FOR SWELLING CONTROL  In addition to icing and elevation, Ace wraps or TED hose are used to help limit and  resolve swelling.  It is recommended to use Ace wraps or TED hose until you are informed to stop.    When using Ace Wraps start the wrapping distally (farthest away from the body) and wrap proximally (closer to the body)   Example: If you had surgery on your leg or thing and you do not have a splint on, start the ace wrap at the toes and work your way up to the thigh        If you had surgery on your upper extremity and do not have a splint on, start the ace wrap at your fingers and work your way up to the upper arm   CALL THE OFFICE WITH ANY QUESTIONS OR CONCERNS: [phone]   VISIT OUR WEBSITE FOR ADDITIONAL INFORMATION: [email or web address]    Discharge Wound Care Instructions  Do NOT apply any ointments, solutions or lotions to pin sites or surgical wounds.  These prevent needed drainage and even though solutions like hydrogen peroxide kill bacteria, they also damage cells lining the pin sites that help fight infection.  Applying lotions or ointments can keep the wounds moist and can cause them to breakdown and open up as well. This can increase the risk for infection. When in doubt call the office.   If any drainage is noted, use foam dressing (mepilex) - These dressing supplies should be available at local medical supply stores [facility], [facility], etc) as well as Insurance claims handler ([facility], [facility], [employer], etc)  Once the incision is completely dry and without drainage, it may be left open to air out.  Showering may begin 36-48 hours later.  Cleaning gently with soap and water.

## 2023-06-18 DIAGNOSIS — S72141A Displaced intertrochanteric fracture of right femur, initial encounter for closed fracture: Secondary | ICD-10-CM | POA: Diagnosis not present

## 2023-06-18 DIAGNOSIS — E440 Moderate protein-calorie malnutrition: Secondary | ICD-10-CM | POA: Diagnosis not present

## 2023-06-18 DIAGNOSIS — D539 Nutritional anemia, unspecified: Secondary | ICD-10-CM | POA: Diagnosis not present

## 2023-06-18 LAB — BASIC METABOLIC PANEL
Anion gap: 4 — ABNORMAL LOW (ref 5–15)
BUN: 7 mg/dL — ABNORMAL LOW (ref 8–23)
CO2: 25 mmol/L (ref 22–32)
Calcium: 8.1 mg/dL — ABNORMAL LOW (ref 8.9–10.3)
Chloride: 106 mmol/L (ref 98–111)
Creatinine, Ser: 0.55 mg/dL (ref 0.44–1.00)
GFR, Estimated: >60 mL/min (ref >60)
Glucose, Bld: 103 mg/dL — ABNORMAL HIGH (ref 70–99)
Potassium: 3.8 mmol/L (ref 3.5–5.1)
Sodium: 135 mmol/L (ref 135–145)

## 2023-06-18 LAB — CBC
HCT: 22.6 % — ABNORMAL LOW (ref 36.0–46.0)
Hemoglobin: 7.4 g/dL — ABNORMAL LOW (ref 12.0–15.0)
MCH: 32.2 pg (ref 26.0–34.0)
MCHC: 32.7 g/dL (ref 30.0–36.0)
MCV: 98.3 fL (ref 80.0–100.0)
Platelets: 153 10*3/uL (ref 150–400)
RBC: 2.3 MIL/uL — ABNORMAL LOW (ref 3.87–5.11)
RDW: 13.7 % (ref 11.5–15.5)
WBC: 5.8 10*3/uL (ref 4.0–10.5)
nRBC: 0 % (ref 0.0–0.2)

## 2023-06-18 NOTE — Plan of Care (Signed)
  Problem: Activity: Goal: Risk for activity intolerance will decrease Outcome: Progressing   Problem: Nutrition: Goal: Adequate nutrition will be maintained Outcome: Progressing   Problem: Coping: Goal: Level of anxiety will decrease Outcome: Progressing   

## 2023-06-18 NOTE — TOC Transition Note (Signed)
Transition of Care [facility]) - CM/SW Discharge Note   Patient Details  Name: Amanda West MRN: [id number] Date of Birth: 09/20/1946  [facility] CM/SW Contact:  Epifanio Lesches, RN Phone Number: 06/18/2023, 3:17 PM   Clinical Narrative:    Patient will DC to: home Anticipated DC date: 06/18/2023 Family notified: yes Transport by: car   Per MD patient ready for DC today.RN, patient, patient's husband, and [facility] notified of DC. [facility] accepted pt for homehealth services. DME delivered to pt room . Post hospital f/u noted on AVS. Pt without RX med concerns.  Husband to provide transportation to home.  RNCM will sign off for now as intervention is no longer needed. Please consult [place] again if new needs arise.    Final next level of care: Home w Home Health Services Barriers to Discharge: No Barriers Identified   Patient Goals and CMS Choice   Choice offered to / list presented to : Patient  Discharge Placement                         Discharge Plan and Services Additional resources added to the After Visit Summary for     Discharge Planning Services: CM Consult            DME Arranged: Bedside commode, Walker rolling DME Agency: [employer] Date DME Agency Contacted: 06/15/23 Time DME Agency Contacted: 1528 Representative spoke with at DME Agency: [facility] HH Arranged: PT HH Agency: [facility] Date [facility] Agency Contacted: 06/15/23 Time HH Agency Contacted: 1059 Representative spoke with at [facility] Agency: [place]  Social Determinants of Health (SDOH) Interventions SDOH Screenings   Food Insecurity: No Food Insecurity (06/13/2023)  Housing: Low Risk  (06/13/2023)  Transportation Needs: No Transportation Needs (06/13/2023)  Utilities: Not At Risk (06/13/2023)  Tobacco Use: Medium Risk (06/15/2023)     Readmission Risk Interventions     No data to display

## 2023-06-18 NOTE — Progress Notes (Signed)
Physical Therapy Treatment Patient Details Name: Amanda West MRN: [id number] DOB: 02-22-1946 Today's Date: 06/18/2023   History of Present Illness Admitted after fall resulting in  right intertrochanteric femur fracture; s/p surgical fixation, WBAT;  has a past medical history of Abnormality of gait, Arthritis, Chronic constipation, Cornea scar, Eyes sensitive to light, bilateral, IDA (iron deficiency anemia), Neuromuscular disorder (HCC), Osteoporosis, Spinal stenosis in cervical region (09/15/2014), Subcutaneous mass, Vitamin D deficiency, and Wears glasses.    PT Comments  Husband present for session of family education including HEP, bed mobility, transfers, gait and stair training. Both did very well throughout session. No further questions and they feel comfortable going home today from a mobility standpoint. MD and RN notified.      Assistance Recommended at Discharge Frequent or constant Supervision/Assistance  If plan is discharge home, recommend the following:  Can travel by private vehicle    A lot of help with bathing/dressing/bathroom;Help with stairs or ramp for entrance;A little help with walking and/or transfers;Assistance with cooking/housework;Assist for transportation      Equipment Recommendations  BSC/3in1;Rolling walker (2 wheels) (youth-sized RW)    Recommendations for Other Services OT consult (as ordered)     Precautions / Restrictions Precautions Precautions: Fall Restrictions Weight Bearing Restrictions: Yes RLE Weight Bearing: Weight bearing as tolerated LLE Weight Bearing: Weight bearing as tolerated     Mobility  Bed Mobility Overal bed mobility: Needs Assistance Bed Mobility: Supine to Sit     Supine to sit: Min assist     General bed mobility comments: to assist RLE; husband provided assist    Transfers Overall transfer level: Needs assistance Equipment used: Rolling walker (2 wheels) Transfers: Sit to/from Stand Sit to Stand: Min  assist           General transfer comment: husband able to assist pt to stand from EOB    Ambulation/Gait Ambulation/Gait assistance: +2 safety/equipment, Min guard Gait Distance (Feet): 100 Feet Assistive device: Rolling walker (2 wheels) Gait Pattern/deviations: Step-through pattern, Decreased stride length, Decreased stance time - right (vaulting on LLE; cued and pt unable to advance RLE if she doesnt vault)       General Gait Details: husband used gait belt (theirs) and appropriately guarded pt while walking   Stairs Stairs: Yes Stairs assistance: Min assist Stair Management: No rails, Step to pattern, Forwards, With walker Number of Stairs: 1 (repeated twice) General stair comments: Husband familiar with stair sequencing from his own knee surgery. vc's for proximity to RW prior to moving it between ground and step; on second attempt no cues needed with husband appropriately guarding   Wheelchair Mobility     Tilt Bed    Modified Rankin (Stroke Patients Only)       Balance Overall balance assessment: Needs assistance Sitting-balance support: Feet supported Sitting balance-Leahy Scale: Fair     Standing balance support: Reliant on assistive device for balance Standing balance-Leahy Scale: Poor Standing balance comment: RW reliant                            Cognition Arousal/Alertness: Awake/alert Behavior During Therapy: WFL for tasks assessed/performed Overall Cognitive Status: Within Functional Limits for tasks assessed                                          Exercises General Exercises - Lower Extremity  Ankle Circles/Pumps: AROM, Both, 5 reps Quad Sets: AROM, Both, 5 reps Short Arc Quad: AROM, Left, 5 reps Heel Slides: AAROM, Left, 5 reps Hip ABduction/ADduction: AAROM, Left, 5 reps Other Exercises Other Exercises: Husband present for education re: HEP    General Comments        Pertinent Vitals/Pain Pain  Assessment Pain Assessment: Faces Faces Pain Scale: Hurts little more Pain Location: R leg/hip/groin Pain Descriptors / Indicators: Grimacing, Guarding, Aching Pain Intervention(s): Monitored during session, Repositioned, Ice applied    Home Living                          Prior Function            PT Goals (current goals can now be found in the care plan section) Acute Rehab PT Goals Patient Stated Goal: To be able to dc to home PT Goal Formulation: With patient/family Time For Goal Achievement: 06/29/23 Potential to Achieve Goals: Good Progress towards PT goals: Progressing toward goals    Frequency    Min 6X/week      PT Plan Current plan remains appropriate    Co-evaluation              AM-PAC PT "6 Clicks" Mobility   Outcome Measure  Help needed turning from your back to your side while in a flat bed without using bedrails?: A Little Help needed moving from lying on your back to sitting on the side of a flat bed without using bedrails?: A Little Help needed moving to and from a bed to a chair (including a wheelchair)?: A Little Help needed standing up from a chair using your arms (e.g., wheelchair or bedside chair)?: A Little Help needed to walk in hospital room?: A Little Help needed climbing 3-5 steps with a railing? : A Little 6 Click Score: 18    End of Session Equipment Utilized During Treatment: Gait belt Activity Tolerance: Patient tolerated treatment well Patient left: in chair;with call bell/phone within reach;with chair alarm set Nurse Communication: Mobility status;Other (comment) (ok for discharge) PT Visit Diagnosis: Unsteadiness on feet (R26.81);Pain Pain - Right/Left: Right Pain - part of body: Hip     Time: 1610-9604 PT Time Calculation (min) (ACUTE ONLY): 35 min  Charges:    $Gait Training: 23-37 mins PT General Charges $$ ACUTE PT VISIT: 1 Visit                      Jerolyn Center, PT Acute Rehabilitation Services   Office [phone]    Zena Amos 06/18/2023, 11:36 AM

## 2023-06-18 NOTE — Progress Notes (Signed)
    Durable Medical Equipment  (From admission, onward)           Start     Ordered   06/18/23 1139  For home use only DME Walker rolling  Once       Question Answer Comment  Walker: With 5 Inch Wheels   Patient needs a walker to treat with the following condition Fx      06/18/23 1139   06/18/23 1139  For home use only DME Bedside commode  Once       Comments: Confine to one room  Question:  Patient needs a bedside commode to treat with the following condition  Answer:  Fx   06/18/23 1139

## 2023-06-18 NOTE — Progress Notes (Signed)
Occupational Therapy Treatment Patient Details Name: Amanda West MRN: [id number] DOB: 1946-03-24 Today's Date: 06/18/2023   History of present illness Admitted after fall resulting in  right intertrochanteric femur fracture; s/p surgical fixation, WBAT;  has a past medical history of Abnormality of gait, Arthritis, Chronic constipation, Cornea scar, Eyes sensitive to light, bilateral, IDA (iron deficiency anemia), Neuromuscular disorder (HCC), Osteoporosis, Spinal stenosis in cervical region (09/15/2014), Subcutaneous mass, Vitamin D deficiency, and Wears glasses.   OT comments  Pt continuing to progress in OT sessions, pt continues to need min A for bed mobility and is min guard for STS from higher surfaces and push offs. Educated pt on AE to complete LBD and bathing but she is okay with her husband providing assist. Reinforced use of leg lifter technique to aid pt with self bed mobility. OT to continue to progress pt as able, DC plans remain appropriate for SNF   Recommendations for follow up therapy are one component of a multi-disciplinary discharge planning process, led by the attending physician.  Recommendations may be updated based on patient status, additional functional criteria and insurance authorization.    Assistance Recommended at Discharge Intermittent Supervision/Assistance  Patient can return home with the following  Assist for transportation;Assistance with cooking/housework;A lot of help with bathing/dressing/bathroom;A lot of help with walking and/or transfers   Equipment Recommendations  None recommended by OT    Recommendations for Other Services      Precautions / Restrictions Precautions Precautions: Fall Restrictions Weight Bearing Restrictions: Yes LLE Weight Bearing: Weight bearing as tolerated       Mobility Bed Mobility Overal bed mobility: Needs Assistance Bed Mobility: Supine to Sit, Sit to Supine     Supine to sit: Min assist Sit to supine: Min  assist   General bed mobility comments: to assist LLE    Transfers Overall transfer level: Needs assistance Equipment used: Rolling walker (2 wheels) Transfers: Sit to/from Stand Sit to Stand: Mod assist           General transfer comment: cues for feet placement and stronger anterior lean to promote offloading     Balance Overall balance assessment: Needs assistance Sitting-balance support: Feet supported Sitting balance-Leahy Scale: Fair     Standing balance support: Reliant on assistive device for balance Standing balance-Leahy Scale: Poor Standing balance comment: RW reliant                           ADL either performed or assessed with clinical judgement   ADL                           Toilet Transfer: Min guard;Rolling walker (2 wheels);BSC/3in1 Toilet Transfer Details (indicate cue type and reason): STS from BSC min guard with use of arm rests. ambulated 61ft. Seated pericare min guard           General ADL Comments: Educated pt on the use of AE to complete LBD and bathing, following demonstration and trials pt reports she prefers her husband do it to keep the relationship "fresh"    Extremity/Trunk Assessment              Vision       Perception     Praxis      Cognition Arousal/Alertness: Awake/alert Behavior During Therapy: WFL for tasks assessed/performed Overall Cognitive Status: Within Functional Limits for tasks assessed  Exercises      Shoulder Instructions       General Comments      Pertinent Vitals/ Pain       Pain Assessment Pain Assessment: Faces Faces Pain Scale: Hurts little more Pain Location: R leg/hip/groin Pain Descriptors / Indicators: Grimacing, Guarding, Aching Pain Intervention(s): Monitored during session, Repositioned  Home Living                                          Prior Functioning/Environment               Frequency  Min 2X/week        Progress Toward Goals  OT Goals(current goals can now be found in the care plan section)  Progress towards OT goals: Progressing toward goals  Acute Rehab OT Goals OT Goal Formulation: With patient Time For Goal Achievement: 06/29/23 Potential to Achieve Goals: Fair ADL Goals Pt Will Perform Lower Body Bathing: sitting/lateral leans;with adaptive equipment;with supervision Pt Will Perform Lower Body Dressing: sitting/lateral leans;with supervision;with adaptive equipment Pt Will Transfer to Toilet: with supervision;ambulating  Plan Discharge plan remains appropriate;Frequency remains appropriate    Co-evaluation                 AM-PAC OT "6 Clicks" Daily Activity     Outcome Measure   Help from another person eating meals?: None Help from another person taking care of personal grooming?: None Help from another person toileting, which includes using toliet, bedpan, or urinal?: A Little Help from another person bathing (including washing, rinsing, drying)?: A Lot Help from another person to put on and taking off regular upper body clothing?: A Little Help from another person to put on and taking off regular lower body clothing?: A Lot 6 Click Score: 18    End of Session Equipment Utilized During Treatment: Gait belt;Rolling walker (2 wheels)  OT Visit Diagnosis: Unsteadiness on feet (R26.81);Pain Pain - Right/Left: Right Pain - part of body: Leg;Hip   Activity Tolerance Patient tolerated treatment well   Patient Left in bed;with call bell/phone within reach   Nurse Communication Mobility status        Time: 1610-9604 OT Time Calculation (min): 34 min  Charges: OT General Charges $OT Visit: 1 Visit OT Treatments $Therapeutic Activity: 23-37 mins  06/18/2023  AB, OTR/L  Acute Rehabilitation Services  Office: [phone]   Amanda West 06/18/2023, 11:09 AM

## 2023-06-18 NOTE — Discharge Summary (Addendum)
Physician Discharge Summary  Amanda West ZHY:[id number] DOB: 1945-12-13 DOA: 06/13/2023  PCP: Lupita Raider, MD  Admit date: 06/13/2023  Discharge date: 06/18/2023  Admitted From:Home  Disposition:  Home  Recommendations for Outpatient Follow-up:  Follow up with PCP in 1-2 weeks, repeat CBC in 1 week Follow-up with orthopedics as requested in 2 weeks Continue on Eliquis 2.5 mg twice daily for 30 days for DVT prophylaxis   Home Health: Yes with PT/OT  Discharge Condition:Stable  CODE STATUS: Full  Diet recommendation: Heart Healthy  Brief/Interim Summary: Amanda West is a 77 y.o. female with medical history significant for cervical spinal stenosis s/p C3-4 ACDF who is admitted with an acute right hip fracture.  Orthopedics consulted and she underwent surgical fixation 7/7.  Noted to have worsening postoperative anemia requiring PRBC transfusion on 7/8, but now has stable hemoglobin levels and is okay for discharge.  She will follow-up with orthopedics outpatient and remain on Eliquis for DVT prophylaxis.  No other acute concerns noted.  Discharge Diagnoses:  Principal Problem:   Closed comminuted intertrochanteric fracture of proximal end of right femur, initial encounter (HCC) Active Problems:   Macrocytic anemia   Malnutrition of moderate degree  Principal discharge diagnosis: Closed comminuted intertrochanteric fracture of proximal right femur status post ORIF with postoperative anemia requiring 1 unit PRBC transfusion.  Discharge Instructions  Discharge Instructions     Ambulatory referral to Orthopedics   Complete by: As directed    Diet - low sodium heart healthy   Complete by: As directed    Increase activity slowly   Complete by: As directed       Allergies as of 06/18/2023       Reactions   Codeine Nausea Only   Pamelor [nortriptyline]    Blurred vision        Medication List     TAKE these medications    apixaban 2.5 MG Tabs tablet Commonly  known as: Eliquis Take 1 tablet (2.5 mg total) by mouth 2 (two) times daily.   APPLE CIDER VINEGAR PO Take 500 mg by mouth every evening.   ascorbic acid 500 MG tablet Commonly known as: VITAMIN C Take 500 mg by mouth in the morning.   CAL-MAG-ZINC PO Take 1 tablet by mouth in the morning.   carboxymethylcellulose 0.5 % Soln Commonly known as: REFRESH PLUS Place 1 drop into both eyes 2 (two) times daily as needed (for dry eye).   CAYENNE PEPPER PO Take 455 mg by mouth daily.   Cinnamon 500 MG capsule Take 1,000 mg by mouth in the morning.   COLLAGEN PO Take 500 mg by mouth every evening.   cyanocobalamin 1000 MCG tablet Commonly known as: VITAMIN B12 Take 1,000 mcg by mouth in the morning.   docusate sodium 100 MG capsule Commonly known as: COLACE Take 1 capsule (100 mg total) by mouth 2 (two) times daily.   DULoxetine 20 MG capsule Commonly known as: CYMBALTA TAKE 1 CAPSULE BY MOUTH ONCE DAILY What changed:  when to take this additional instructions   GREEN TEA (CAMELLIA SINENSIS) PO Take 1 capsule by mouth in the morning.   hydrOXYzine 25 MG tablet Commonly known as: ATARAX Take 1 tablet (25 mg total) by mouth 3 (three) times daily as needed for itching.   MELATONIN PO Take 25 mg by mouth at bedtime as needed (sleep).   methocarbamol 500 MG tablet Commonly known as: ROBAXIN Take 1 tablet (500 mg total) by mouth every 6 (six) hours  as needed for muscle spasms.   oxyCODONE 5 MG immediate release tablet Commonly known as: Oxy IR/ROXICODONE Take 1 tablet (5 mg total) by mouth every 4 (four) hours as needed for moderate pain, severe pain or breakthrough pain.   polyethylene glycol 17 g packet Commonly known as: MIRALAX / GLYCOLAX Take 17 g by mouth daily as needed for mild constipation.   Potassium 99 MG Tabs Take 99 mg by mouth every evening.   senna 8.6 MG tablet Commonly known as: SENOKOT Take 4 tablets by mouth at bedtime.   Vitamin D 50 MCG  (2000 UT) tablet Take 2,000 Units by mouth daily.   zinc gluconate 50 MG tablet Take 50 mg by mouth daily.               Durable Medical Equipment  (From admission, onward)           Start     Ordered   06/18/23 1139  For home use only DME Walker rolling  Once       Question Answer Comment  Walker: With 5 Inch Wheels   Patient needs a walker to treat with the following condition Fx      06/18/23 1139   06/18/23 1139  For home use only DME Bedside commode  Once       Comments: Confine to one room  Question:  Patient needs a bedside commode to treat with the following condition  Answer:  Fx   06/18/23 1139            Follow-up Information     Lupita Raider, MD. Schedule an appointment as soon as possible for a visit in 1 week(s).   Specialty: Family Medicine Contact information: [address] [place] [address] [phone]         Roby Lofts, MD. Schedule an appointment as soon as possible for a visit in 2 week(s).   Specialty: Orthopedic Surgery Contact information: [address] [place] [address] [phone]                Allergies  Allergen Reactions   Codeine Nausea Only   Pamelor [Nortriptyline]     Blurred vision    Consultations: Orthopedics   Procedures/Studies: DG FEMUR PORT, MIN 2 VIEWS RIGHT  Result Date: 06/14/2023 CLINICAL DATA:  Femur fracture, postop. EXAM: RIGHT FEMUR PORTABLE 2 VIEW COMPARISON:  Preoperative radiograph FINDINGS: Femoral intramedullary nail with trans trochanteric and distal locking screw fixation traverse proximal femur fracture. Improved fracture alignment from preoperative imaging. Persistent displacement of the lesser trochanteric fracture fragment. Recent postsurgical change includes air and edema in the soft tissues. IMPRESSION: ORIF of proximal femur fracture. No immediate postoperative complication. Electronically Signed   By: Narda Rutherford M.D.   On:  06/14/2023 10:21   DG FEMUR, MIN 2 VIEWS RIGHT  Result Date: 06/14/2023 CLINICAL DATA:  Elective surgery. EXAM: RIGHT FEMUR 2 VIEWS COMPARISON:  Preoperative radiograph. FINDINGS: Six fluoroscopic spot views of the right femur obtained in the operating room. Sequential images during intramedullary nail, trans trochanteric and distal locking screw fixation of proximal femur fracture. Fluoroscopy time 89.5 seconds. Dose 5.19 mGy. IMPRESSION: Intraoperative fluoroscopy during ORIF of proximal femur fracture. Electronically Signed   By: Narda Rutherford M.D.   On: 06/14/2023 10:21   DG C-Arm 1-60 Min-No Report  Result Date: 06/14/2023 Fluoroscopy was utilized by the requesting physician.  No radiographic interpretation.   DG C-Arm 1-60 Min-No Report  Result Date: 06/14/2023  Fluoroscopy was utilized by the requesting physician.  No radiographic interpretation.   Chest Portable 1 View  Result Date: 06/13/2023 CLINICAL DATA:  PREOP EXAM: PORTABLE CHEST 1 VIEW COMPARISON:  12/20/12 FINDINGS: The heart size and mediastinal contours are within normal limits. Both lungs are clear. The visualized skeletal structures are unremarkable. IMPRESSION: No active disease. Electronically Signed   By: Deatra Robinson M.D.   On: 06/13/2023 22:14   DG Hip Unilat With Pelvis 2-3 Views Right  Result Date: 06/13/2023 CLINICAL DATA:  Status post fall. EXAM: DG HIP (WITH OR WITHOUT PELVIS) 2-3V RIGHT COMPARISON:  None Available. FINDINGS: There is an acute, comminuted fracture deformity extending through the inter trochanteric region of the proximal right femur. There is no evidence of dislocation. Moderate severity degenerative changes seen involving both hips, in the form of joint space narrowing and acetabular sclerosis. IMPRESSION: Acute, comminuted intertrochanteric fracture of the proximal right femur. Electronically Signed   By: Aram Candela M.D.   On: 06/13/2023 21:20     Discharge Exam: Vitals:   06/18/23 0411  06/18/23 0801  BP: 117/64 (!) 107/57  Pulse: 88 81  Resp: 18 18  Temp: 98.2 F (36.8 C) 98.6 F (37 C)  SpO2: 98% 98%   Vitals:   06/17/23 1230 06/17/23 1920 06/18/23 0411 06/18/23 0801  BP: (!) 109/55 108/69 117/64 (!) 107/57  Pulse: 76 78 88 81  Resp:  18 18 18   Temp: 98.7 F (37.1 C) 98.5 F (36.9 C) 98.2 F (36.8 C) 98.6 F (37 C)  TempSrc: Oral Oral Oral   SpO2: 100% 100% 98% 98%  Weight:      Height:        General: Pt is alert, awake, not in acute distress Cardiovascular: RRR, S1/S2 +, no rubs, no gallops Respiratory: CTA bilaterally, no wheezing, no rhonchi Abdominal: Soft, NT, ND, bowel sounds + Extremities: no edema, no cyanosis, right lower extremity incisions C/D/I    The results of significant diagnostics from this hospitalization (including imaging, microbiology, ancillary and laboratory) are listed below for reference.     Microbiology: Recent Results (from the past 240 hour(s))  Surgical pcr screen     Status: None   Collection Time: 06/14/23 12:11 AM   Specimen: Nasal Mucosa; Nasal Swab  Result Value Ref Range Status   MRSA, PCR NEGATIVE NEGATIVE Final   Staphylococcus aureus NEGATIVE NEGATIVE Final    Comment: (NOTE) The Xpert SA Assay (FDA approved for NASAL specimens in patients 50 years of age and older), is one component of a comprehensive surveillance program. It is not intended to diagnose infection nor to guide or monitor treatment. Performed at [facility], [address], [place], [place] [address]      Labs: BNP (last 3 results) No results for input(s): "BNP" in the last 8760 hours. Basic Metabolic Panel: Recent Labs  Lab 06/13/23 2022 06/14/23 0258 06/15/23 0404 06/16/23 0532 06/18/23 0334  NA 138 137 133* 138 135  K 3.8 3.7 4.0 3.7 3.8  CL 104 103 102 103 106  CO2 26 27 26 28 25   GLUCOSE 116* 116* 113* 99 103*  BUN 11 11 8 9  7*  CREATININE 0.66 0.69 0.53 0.60 0.55  CALCIUM 8.6* 8.6* 8.1* 8.3* 8.1*  MG  --    --   --  1.9  --    Liver Function Tests: No results for input(s): "AST", "ALT", "ALKPHOS", "BILITOT", "PROT", "ALBUMIN" in the last 168 hours. No results for input(s): "LIPASE", "  AMYLASE" in the last 168 hours. No results for input(s): "AMMONIA" in the last 168 hours. CBC: Recent Labs  Lab 06/13/23 2022 06/14/23 0258 06/15/23 0404 06/15/23 1128 06/16/23 0532 06/17/23 0947 06/18/23 0334  WBC 3.7* 7.8 7.1  --  6.7 6.5 5.8  NEUTROABS 2.2  --   --   --   --   --   --   HGB 10.8* 9.5* 6.3* 8.1* 8.5* 7.8* 7.4*  HCT 32.7* 28.9* 19.1* 24.6* 25.2* 23.5* 22.6*  MCV 102.5* 100.3* 101.1*  --  96.2 98.3 98.3  PLT 185 173 116*  --  132* 131* 153   Cardiac Enzymes: No results for input(s): "CKTOTAL", "CKMB", "CKMBINDEX", "TROPONINI" in the last 168 hours. BNP: Invalid input(s): "POCBNP" CBG: No results for input(s): "GLUCAP" in the last 168 hours. D-Dimer No results for input(s): "DDIMER" in the last 72 hours. Hgb A1c No results for input(s): "HGBA1C" in the last 72 hours. Lipid Profile No results for input(s): "CHOL", "HDL", "LDLCALC", "TRIG", "CHOLHDL", "LDLDIRECT" in the last 72 hours. Thyroid function studies No results for input(s): "TSH", "T4TOTAL", "T3FREE", "THYROIDAB" in the last 72 hours.  Invalid input(s): "FREET3" Anemia work up No results for input(s): "VITAMINB12", "FOLATE", "FERRITIN", "TIBC", "IRON", "RETICCTPCT" in the last 72 hours.  Urinalysis    Component Value Date/Time   COLORURINE AMBER (A) 09/16/2020 0312   APPEARANCEUR TURBID (A) 09/16/2020 0312   LABSPEC 1.003 (L) 09/16/2020 0312   PHURINE 6.0 09/16/2020 0312   GLUCOSEU NEGATIVE 09/16/2020 0312   HGBUR LARGE (A) 09/16/2020 0312   BILIRUBINUR NEGATIVE 09/16/2020 0312   KETONESUR NEGATIVE 09/16/2020 0312   PROTEINUR 100 (A) 09/16/2020 0312   UROBILINOGEN 0.2 06/29/2013 2125   NITRITE NEGATIVE 09/16/2020 0312   LEUKOCYTESUR LARGE (A) 09/16/2020 0312   Sepsis Labs Recent Labs  Lab 06/15/23 0404  06/16/23 0532 06/17/23 0947 06/18/23 0334  WBC 7.1 6.7 6.5 5.8   Microbiology Recent Results (from the past 240 hour(s))  Surgical pcr screen     Status: None   Collection Time: 06/14/23 12:11 AM   Specimen: Nasal Mucosa; Nasal Swab  Result Value Ref Range Status   MRSA, PCR NEGATIVE NEGATIVE Final   Staphylococcus aureus NEGATIVE NEGATIVE Final    Comment: (NOTE) The Xpert SA Assay (FDA approved for NASAL specimens in patients 44 years of age and older), is one component of a comprehensive surveillance program. It is not intended to diagnose infection nor to guide or monitor treatment. Performed at [facility], [address], [place], [place] [address]      Time coordinating discharge: 39 minutes  SIGNED:   Joycelyn Das, MD [facility] 06/18/2023, 12:03 PM  If 7PM-7AM, please contact night-coverage [email or web address]

## 2023-06-19 DIAGNOSIS — Z7901 Long term (current) use of anticoagulants: Secondary | ICD-10-CM | POA: Diagnosis not present

## 2023-06-19 DIAGNOSIS — Z9181 History of falling: Secondary | ICD-10-CM | POA: Diagnosis not present

## 2023-06-19 DIAGNOSIS — G629 Polyneuropathy, unspecified: Secondary | ICD-10-CM | POA: Diagnosis not present

## 2023-06-19 DIAGNOSIS — Z87891 Personal history of nicotine dependence: Secondary | ICD-10-CM | POA: Diagnosis not present

## 2023-06-19 DIAGNOSIS — D509 Iron deficiency anemia, unspecified: Secondary | ICD-10-CM | POA: Diagnosis not present

## 2023-06-19 DIAGNOSIS — Z6820 Body mass index (BMI) 20.0-20.9, adult: Secondary | ICD-10-CM | POA: Diagnosis not present

## 2023-06-19 DIAGNOSIS — M4802 Spinal stenosis, cervical region: Secondary | ICD-10-CM | POA: Diagnosis not present

## 2023-06-19 DIAGNOSIS — G959 Disease of spinal cord, unspecified: Secondary | ICD-10-CM | POA: Diagnosis not present

## 2023-06-19 DIAGNOSIS — K5909 Other constipation: Secondary | ICD-10-CM | POA: Diagnosis not present

## 2023-06-19 DIAGNOSIS — M80051D Age-related osteoporosis with current pathological fracture, right femur, subsequent encounter for fracture with routine healing: Secondary | ICD-10-CM | POA: Diagnosis not present

## 2023-06-19 DIAGNOSIS — E440 Moderate protein-calorie malnutrition: Secondary | ICD-10-CM | POA: Diagnosis not present

## 2023-06-19 DIAGNOSIS — D539 Nutritional anemia, unspecified: Secondary | ICD-10-CM | POA: Diagnosis not present

## 2023-06-19 DIAGNOSIS — M1990 Unspecified osteoarthritis, unspecified site: Secondary | ICD-10-CM | POA: Diagnosis not present

## 2023-06-24 DIAGNOSIS — D509 Iron deficiency anemia, unspecified: Secondary | ICD-10-CM | POA: Diagnosis not present

## 2023-06-24 DIAGNOSIS — Z7901 Long term (current) use of anticoagulants: Secondary | ICD-10-CM | POA: Diagnosis not present

## 2023-06-24 DIAGNOSIS — Z9181 History of falling: Secondary | ICD-10-CM | POA: Diagnosis not present

## 2023-06-24 DIAGNOSIS — Z87891 Personal history of nicotine dependence: Secondary | ICD-10-CM | POA: Diagnosis not present

## 2023-06-24 DIAGNOSIS — Z6820 Body mass index (BMI) 20.0-20.9, adult: Secondary | ICD-10-CM | POA: Diagnosis not present

## 2023-06-24 DIAGNOSIS — E440 Moderate protein-calorie malnutrition: Secondary | ICD-10-CM | POA: Diagnosis not present

## 2023-06-24 DIAGNOSIS — M4802 Spinal stenosis, cervical region: Secondary | ICD-10-CM | POA: Diagnosis not present

## 2023-06-24 DIAGNOSIS — K5909 Other constipation: Secondary | ICD-10-CM | POA: Diagnosis not present

## 2023-06-24 DIAGNOSIS — D539 Nutritional anemia, unspecified: Secondary | ICD-10-CM | POA: Diagnosis not present

## 2023-06-24 DIAGNOSIS — G629 Polyneuropathy, unspecified: Secondary | ICD-10-CM | POA: Diagnosis not present

## 2023-06-24 DIAGNOSIS — G959 Disease of spinal cord, unspecified: Secondary | ICD-10-CM | POA: Diagnosis not present

## 2023-06-24 DIAGNOSIS — M80051D Age-related osteoporosis with current pathological fracture, right femur, subsequent encounter for fracture with routine healing: Secondary | ICD-10-CM | POA: Diagnosis not present

## 2023-06-24 DIAGNOSIS — M1990 Unspecified osteoarthritis, unspecified site: Secondary | ICD-10-CM | POA: Diagnosis not present

## 2023-06-26 DIAGNOSIS — M80051D Age-related osteoporosis with current pathological fracture, right femur, subsequent encounter for fracture with routine healing: Secondary | ICD-10-CM | POA: Diagnosis not present

## 2023-06-26 DIAGNOSIS — D509 Iron deficiency anemia, unspecified: Secondary | ICD-10-CM | POA: Diagnosis not present

## 2023-06-26 DIAGNOSIS — E440 Moderate protein-calorie malnutrition: Secondary | ICD-10-CM | POA: Diagnosis not present

## 2023-06-26 DIAGNOSIS — D539 Nutritional anemia, unspecified: Secondary | ICD-10-CM | POA: Diagnosis not present

## 2023-06-26 DIAGNOSIS — Z6820 Body mass index (BMI) 20.0-20.9, adult: Secondary | ICD-10-CM | POA: Diagnosis not present

## 2023-06-26 DIAGNOSIS — G959 Disease of spinal cord, unspecified: Secondary | ICD-10-CM | POA: Diagnosis not present

## 2023-06-26 DIAGNOSIS — M4802 Spinal stenosis, cervical region: Secondary | ICD-10-CM | POA: Diagnosis not present

## 2023-06-26 DIAGNOSIS — K5909 Other constipation: Secondary | ICD-10-CM | POA: Diagnosis not present

## 2023-06-26 DIAGNOSIS — M1990 Unspecified osteoarthritis, unspecified site: Secondary | ICD-10-CM | POA: Diagnosis not present

## 2023-06-26 DIAGNOSIS — Z87891 Personal history of nicotine dependence: Secondary | ICD-10-CM | POA: Diagnosis not present

## 2023-06-26 DIAGNOSIS — Z7901 Long term (current) use of anticoagulants: Secondary | ICD-10-CM | POA: Diagnosis not present

## 2023-06-26 DIAGNOSIS — G629 Polyneuropathy, unspecified: Secondary | ICD-10-CM | POA: Diagnosis not present

## 2023-06-26 DIAGNOSIS — Z9181 History of falling: Secondary | ICD-10-CM | POA: Diagnosis not present

## 2023-06-29 DIAGNOSIS — M1990 Unspecified osteoarthritis, unspecified site: Secondary | ICD-10-CM | POA: Diagnosis not present

## 2023-06-29 DIAGNOSIS — Z87891 Personal history of nicotine dependence: Secondary | ICD-10-CM | POA: Diagnosis not present

## 2023-06-29 DIAGNOSIS — G959 Disease of spinal cord, unspecified: Secondary | ICD-10-CM | POA: Diagnosis not present

## 2023-06-29 DIAGNOSIS — G629 Polyneuropathy, unspecified: Secondary | ICD-10-CM | POA: Diagnosis not present

## 2023-06-29 DIAGNOSIS — Z9181 History of falling: Secondary | ICD-10-CM | POA: Diagnosis not present

## 2023-06-29 DIAGNOSIS — E440 Moderate protein-calorie malnutrition: Secondary | ICD-10-CM | POA: Diagnosis not present

## 2023-06-29 DIAGNOSIS — Z7901 Long term (current) use of anticoagulants: Secondary | ICD-10-CM | POA: Diagnosis not present

## 2023-06-29 DIAGNOSIS — M80051D Age-related osteoporosis with current pathological fracture, right femur, subsequent encounter for fracture with routine healing: Secondary | ICD-10-CM | POA: Diagnosis not present

## 2023-06-29 DIAGNOSIS — D539 Nutritional anemia, unspecified: Secondary | ICD-10-CM | POA: Diagnosis not present

## 2023-06-29 DIAGNOSIS — K5909 Other constipation: Secondary | ICD-10-CM | POA: Diagnosis not present

## 2023-06-29 DIAGNOSIS — M4802 Spinal stenosis, cervical region: Secondary | ICD-10-CM | POA: Diagnosis not present

## 2023-06-29 DIAGNOSIS — D509 Iron deficiency anemia, unspecified: Secondary | ICD-10-CM | POA: Diagnosis not present

## 2023-06-29 DIAGNOSIS — Z6820 Body mass index (BMI) 20.0-20.9, adult: Secondary | ICD-10-CM | POA: Diagnosis not present

## 2023-06-30 DIAGNOSIS — S72141D Displaced intertrochanteric fracture of right femur, subsequent encounter for closed fracture with routine healing: Secondary | ICD-10-CM | POA: Diagnosis not present

## 2023-07-02 DIAGNOSIS — G629 Polyneuropathy, unspecified: Secondary | ICD-10-CM | POA: Diagnosis not present

## 2023-07-02 DIAGNOSIS — Z87891 Personal history of nicotine dependence: Secondary | ICD-10-CM | POA: Diagnosis not present

## 2023-07-02 DIAGNOSIS — M1990 Unspecified osteoarthritis, unspecified site: Secondary | ICD-10-CM | POA: Diagnosis not present

## 2023-07-02 DIAGNOSIS — K5909 Other constipation: Secondary | ICD-10-CM | POA: Diagnosis not present

## 2023-07-02 DIAGNOSIS — D509 Iron deficiency anemia, unspecified: Secondary | ICD-10-CM | POA: Diagnosis not present

## 2023-07-02 DIAGNOSIS — Z9181 History of falling: Secondary | ICD-10-CM | POA: Diagnosis not present

## 2023-07-02 DIAGNOSIS — G959 Disease of spinal cord, unspecified: Secondary | ICD-10-CM | POA: Diagnosis not present

## 2023-07-02 DIAGNOSIS — Z7901 Long term (current) use of anticoagulants: Secondary | ICD-10-CM | POA: Diagnosis not present

## 2023-07-02 DIAGNOSIS — M80051D Age-related osteoporosis with current pathological fracture, right femur, subsequent encounter for fracture with routine healing: Secondary | ICD-10-CM | POA: Diagnosis not present

## 2023-07-02 DIAGNOSIS — M4802 Spinal stenosis, cervical region: Secondary | ICD-10-CM | POA: Diagnosis not present

## 2023-07-02 DIAGNOSIS — D539 Nutritional anemia, unspecified: Secondary | ICD-10-CM | POA: Diagnosis not present

## 2023-07-02 DIAGNOSIS — Z6820 Body mass index (BMI) 20.0-20.9, adult: Secondary | ICD-10-CM | POA: Diagnosis not present

## 2023-07-02 DIAGNOSIS — E440 Moderate protein-calorie malnutrition: Secondary | ICD-10-CM | POA: Diagnosis not present

## 2023-07-05 DIAGNOSIS — D539 Nutritional anemia, unspecified: Secondary | ICD-10-CM | POA: Diagnosis not present

## 2023-07-05 DIAGNOSIS — G629 Polyneuropathy, unspecified: Secondary | ICD-10-CM | POA: Diagnosis not present

## 2023-07-05 DIAGNOSIS — Z6820 Body mass index (BMI) 20.0-20.9, adult: Secondary | ICD-10-CM | POA: Diagnosis not present

## 2023-07-05 DIAGNOSIS — G959 Disease of spinal cord, unspecified: Secondary | ICD-10-CM | POA: Diagnosis not present

## 2023-07-05 DIAGNOSIS — K5909 Other constipation: Secondary | ICD-10-CM | POA: Diagnosis not present

## 2023-07-05 DIAGNOSIS — Z7901 Long term (current) use of anticoagulants: Secondary | ICD-10-CM | POA: Diagnosis not present

## 2023-07-05 DIAGNOSIS — E440 Moderate protein-calorie malnutrition: Secondary | ICD-10-CM | POA: Diagnosis not present

## 2023-07-05 DIAGNOSIS — Z9181 History of falling: Secondary | ICD-10-CM | POA: Diagnosis not present

## 2023-07-05 DIAGNOSIS — M4802 Spinal stenosis, cervical region: Secondary | ICD-10-CM | POA: Diagnosis not present

## 2023-07-05 DIAGNOSIS — D509 Iron deficiency anemia, unspecified: Secondary | ICD-10-CM | POA: Diagnosis not present

## 2023-07-05 DIAGNOSIS — M80051D Age-related osteoporosis with current pathological fracture, right femur, subsequent encounter for fracture with routine healing: Secondary | ICD-10-CM | POA: Diagnosis not present

## 2023-07-05 DIAGNOSIS — M1990 Unspecified osteoarthritis, unspecified site: Secondary | ICD-10-CM | POA: Diagnosis not present

## 2023-07-05 DIAGNOSIS — Z87891 Personal history of nicotine dependence: Secondary | ICD-10-CM | POA: Diagnosis not present

## 2023-07-10 DIAGNOSIS — Z6820 Body mass index (BMI) 20.0-20.9, adult: Secondary | ICD-10-CM | POA: Diagnosis not present

## 2023-07-10 DIAGNOSIS — D539 Nutritional anemia, unspecified: Secondary | ICD-10-CM | POA: Diagnosis not present

## 2023-07-10 DIAGNOSIS — M4802 Spinal stenosis, cervical region: Secondary | ICD-10-CM | POA: Diagnosis not present

## 2023-07-10 DIAGNOSIS — Z7901 Long term (current) use of anticoagulants: Secondary | ICD-10-CM | POA: Diagnosis not present

## 2023-07-10 DIAGNOSIS — D509 Iron deficiency anemia, unspecified: Secondary | ICD-10-CM | POA: Diagnosis not present

## 2023-07-10 DIAGNOSIS — E440 Moderate protein-calorie malnutrition: Secondary | ICD-10-CM | POA: Diagnosis not present

## 2023-07-10 DIAGNOSIS — Z9181 History of falling: Secondary | ICD-10-CM | POA: Diagnosis not present

## 2023-07-10 DIAGNOSIS — M1990 Unspecified osteoarthritis, unspecified site: Secondary | ICD-10-CM | POA: Diagnosis not present

## 2023-07-10 DIAGNOSIS — G959 Disease of spinal cord, unspecified: Secondary | ICD-10-CM | POA: Diagnosis not present

## 2023-07-10 DIAGNOSIS — M80051D Age-related osteoporosis with current pathological fracture, right femur, subsequent encounter for fracture with routine healing: Secondary | ICD-10-CM | POA: Diagnosis not present

## 2023-07-10 DIAGNOSIS — Z87891 Personal history of nicotine dependence: Secondary | ICD-10-CM | POA: Diagnosis not present

## 2023-07-10 DIAGNOSIS — G629 Polyneuropathy, unspecified: Secondary | ICD-10-CM | POA: Diagnosis not present

## 2023-07-10 DIAGNOSIS — K5909 Other constipation: Secondary | ICD-10-CM | POA: Diagnosis not present

## 2023-07-13 DIAGNOSIS — Z9181 History of falling: Secondary | ICD-10-CM | POA: Diagnosis not present

## 2023-07-13 DIAGNOSIS — G629 Polyneuropathy, unspecified: Secondary | ICD-10-CM | POA: Diagnosis not present

## 2023-07-13 DIAGNOSIS — E440 Moderate protein-calorie malnutrition: Secondary | ICD-10-CM | POA: Diagnosis not present

## 2023-07-13 DIAGNOSIS — D539 Nutritional anemia, unspecified: Secondary | ICD-10-CM | POA: Diagnosis not present

## 2023-07-13 DIAGNOSIS — M80051D Age-related osteoporosis with current pathological fracture, right femur, subsequent encounter for fracture with routine healing: Secondary | ICD-10-CM | POA: Diagnosis not present

## 2023-07-13 DIAGNOSIS — K5909 Other constipation: Secondary | ICD-10-CM | POA: Diagnosis not present

## 2023-07-13 DIAGNOSIS — Z7901 Long term (current) use of anticoagulants: Secondary | ICD-10-CM | POA: Diagnosis not present

## 2023-07-13 DIAGNOSIS — M1990 Unspecified osteoarthritis, unspecified site: Secondary | ICD-10-CM | POA: Diagnosis not present

## 2023-07-13 DIAGNOSIS — G959 Disease of spinal cord, unspecified: Secondary | ICD-10-CM | POA: Diagnosis not present

## 2023-07-13 DIAGNOSIS — Z6820 Body mass index (BMI) 20.0-20.9, adult: Secondary | ICD-10-CM | POA: Diagnosis not present

## 2023-07-13 DIAGNOSIS — Z87891 Personal history of nicotine dependence: Secondary | ICD-10-CM | POA: Diagnosis not present

## 2023-07-13 DIAGNOSIS — M4802 Spinal stenosis, cervical region: Secondary | ICD-10-CM | POA: Diagnosis not present

## 2023-07-13 DIAGNOSIS — D509 Iron deficiency anemia, unspecified: Secondary | ICD-10-CM | POA: Diagnosis not present

## 2023-07-17 DIAGNOSIS — S70362A Insect bite (nonvenomous), left thigh, initial encounter: Secondary | ICD-10-CM | POA: Diagnosis not present

## 2023-07-17 DIAGNOSIS — E440 Moderate protein-calorie malnutrition: Secondary | ICD-10-CM | POA: Diagnosis not present

## 2023-07-17 DIAGNOSIS — D539 Nutritional anemia, unspecified: Secondary | ICD-10-CM | POA: Diagnosis not present

## 2023-07-17 DIAGNOSIS — G959 Disease of spinal cord, unspecified: Secondary | ICD-10-CM | POA: Diagnosis not present

## 2023-07-17 DIAGNOSIS — M4802 Spinal stenosis, cervical region: Secondary | ICD-10-CM | POA: Diagnosis not present

## 2023-07-17 DIAGNOSIS — Z7901 Long term (current) use of anticoagulants: Secondary | ICD-10-CM | POA: Diagnosis not present

## 2023-07-17 DIAGNOSIS — Z87891 Personal history of nicotine dependence: Secondary | ICD-10-CM | POA: Diagnosis not present

## 2023-07-17 DIAGNOSIS — Z6820 Body mass index (BMI) 20.0-20.9, adult: Secondary | ICD-10-CM | POA: Diagnosis not present

## 2023-07-17 DIAGNOSIS — G629 Polyneuropathy, unspecified: Secondary | ICD-10-CM | POA: Diagnosis not present

## 2023-07-17 DIAGNOSIS — M1990 Unspecified osteoarthritis, unspecified site: Secondary | ICD-10-CM | POA: Diagnosis not present

## 2023-07-17 DIAGNOSIS — D509 Iron deficiency anemia, unspecified: Secondary | ICD-10-CM | POA: Diagnosis not present

## 2023-07-17 DIAGNOSIS — Z9181 History of falling: Secondary | ICD-10-CM | POA: Diagnosis not present

## 2023-07-17 DIAGNOSIS — K5909 Other constipation: Secondary | ICD-10-CM | POA: Diagnosis not present

## 2023-07-17 DIAGNOSIS — S70361A Insect bite (nonvenomous), right thigh, initial encounter: Secondary | ICD-10-CM | POA: Diagnosis not present

## 2023-07-17 DIAGNOSIS — M80051D Age-related osteoporosis with current pathological fracture, right femur, subsequent encounter for fracture with routine healing: Secondary | ICD-10-CM | POA: Diagnosis not present

## 2023-07-23 DIAGNOSIS — Z6820 Body mass index (BMI) 20.0-20.9, adult: Secondary | ICD-10-CM | POA: Diagnosis not present

## 2023-07-23 DIAGNOSIS — M80051D Age-related osteoporosis with current pathological fracture, right femur, subsequent encounter for fracture with routine healing: Secondary | ICD-10-CM | POA: Diagnosis not present

## 2023-07-23 DIAGNOSIS — Z9181 History of falling: Secondary | ICD-10-CM | POA: Diagnosis not present

## 2023-07-23 DIAGNOSIS — K5909 Other constipation: Secondary | ICD-10-CM | POA: Diagnosis not present

## 2023-07-23 DIAGNOSIS — G959 Disease of spinal cord, unspecified: Secondary | ICD-10-CM | POA: Diagnosis not present

## 2023-07-23 DIAGNOSIS — M4802 Spinal stenosis, cervical region: Secondary | ICD-10-CM | POA: Diagnosis not present

## 2023-07-23 DIAGNOSIS — E440 Moderate protein-calorie malnutrition: Secondary | ICD-10-CM | POA: Diagnosis not present

## 2023-07-23 DIAGNOSIS — D509 Iron deficiency anemia, unspecified: Secondary | ICD-10-CM | POA: Diagnosis not present

## 2023-07-23 DIAGNOSIS — D539 Nutritional anemia, unspecified: Secondary | ICD-10-CM | POA: Diagnosis not present

## 2023-07-23 DIAGNOSIS — G629 Polyneuropathy, unspecified: Secondary | ICD-10-CM | POA: Diagnosis not present

## 2023-07-23 DIAGNOSIS — Z87891 Personal history of nicotine dependence: Secondary | ICD-10-CM | POA: Diagnosis not present

## 2023-07-23 DIAGNOSIS — M1990 Unspecified osteoarthritis, unspecified site: Secondary | ICD-10-CM | POA: Diagnosis not present

## 2023-07-23 DIAGNOSIS — Z7901 Long term (current) use of anticoagulants: Secondary | ICD-10-CM | POA: Diagnosis not present

## 2023-07-28 DIAGNOSIS — M25562 Pain in left knee: Secondary | ICD-10-CM | POA: Diagnosis not present

## 2023-07-28 DIAGNOSIS — S72141D Displaced intertrochanteric fracture of right femur, subsequent encounter for closed fracture with routine healing: Secondary | ICD-10-CM | POA: Diagnosis not present

## 2023-07-30 DIAGNOSIS — G629 Polyneuropathy, unspecified: Secondary | ICD-10-CM | POA: Diagnosis not present

## 2023-07-30 DIAGNOSIS — D539 Nutritional anemia, unspecified: Secondary | ICD-10-CM | POA: Diagnosis not present

## 2023-07-30 DIAGNOSIS — Z87891 Personal history of nicotine dependence: Secondary | ICD-10-CM | POA: Diagnosis not present

## 2023-07-30 DIAGNOSIS — Z7901 Long term (current) use of anticoagulants: Secondary | ICD-10-CM | POA: Diagnosis not present

## 2023-07-30 DIAGNOSIS — G959 Disease of spinal cord, unspecified: Secondary | ICD-10-CM | POA: Diagnosis not present

## 2023-07-30 DIAGNOSIS — E440 Moderate protein-calorie malnutrition: Secondary | ICD-10-CM | POA: Diagnosis not present

## 2023-07-30 DIAGNOSIS — M80051D Age-related osteoporosis with current pathological fracture, right femur, subsequent encounter for fracture with routine healing: Secondary | ICD-10-CM | POA: Diagnosis not present

## 2023-07-30 DIAGNOSIS — Z9181 History of falling: Secondary | ICD-10-CM | POA: Diagnosis not present

## 2023-07-30 DIAGNOSIS — Z6820 Body mass index (BMI) 20.0-20.9, adult: Secondary | ICD-10-CM | POA: Diagnosis not present

## 2023-07-30 DIAGNOSIS — K5909 Other constipation: Secondary | ICD-10-CM | POA: Diagnosis not present

## 2023-07-30 DIAGNOSIS — M1990 Unspecified osteoarthritis, unspecified site: Secondary | ICD-10-CM | POA: Diagnosis not present

## 2023-07-30 DIAGNOSIS — D509 Iron deficiency anemia, unspecified: Secondary | ICD-10-CM | POA: Diagnosis not present

## 2023-07-30 DIAGNOSIS — M4802 Spinal stenosis, cervical region: Secondary | ICD-10-CM | POA: Diagnosis not present

## 2023-08-06 DIAGNOSIS — Z7901 Long term (current) use of anticoagulants: Secondary | ICD-10-CM | POA: Diagnosis not present

## 2023-08-06 DIAGNOSIS — D539 Nutritional anemia, unspecified: Secondary | ICD-10-CM | POA: Diagnosis not present

## 2023-08-06 DIAGNOSIS — Z87891 Personal history of nicotine dependence: Secondary | ICD-10-CM | POA: Diagnosis not present

## 2023-08-06 DIAGNOSIS — D509 Iron deficiency anemia, unspecified: Secondary | ICD-10-CM | POA: Diagnosis not present

## 2023-08-06 DIAGNOSIS — M4802 Spinal stenosis, cervical region: Secondary | ICD-10-CM | POA: Diagnosis not present

## 2023-08-06 DIAGNOSIS — M80051D Age-related osteoporosis with current pathological fracture, right femur, subsequent encounter for fracture with routine healing: Secondary | ICD-10-CM | POA: Diagnosis not present

## 2023-08-06 DIAGNOSIS — G959 Disease of spinal cord, unspecified: Secondary | ICD-10-CM | POA: Diagnosis not present

## 2023-08-06 DIAGNOSIS — M1990 Unspecified osteoarthritis, unspecified site: Secondary | ICD-10-CM | POA: Diagnosis not present

## 2023-08-06 DIAGNOSIS — K5909 Other constipation: Secondary | ICD-10-CM | POA: Diagnosis not present

## 2023-08-06 DIAGNOSIS — Z9181 History of falling: Secondary | ICD-10-CM | POA: Diagnosis not present

## 2023-08-06 DIAGNOSIS — G629 Polyneuropathy, unspecified: Secondary | ICD-10-CM | POA: Diagnosis not present

## 2023-08-06 DIAGNOSIS — E440 Moderate protein-calorie malnutrition: Secondary | ICD-10-CM | POA: Diagnosis not present

## 2023-08-06 DIAGNOSIS — Z6820 Body mass index (BMI) 20.0-20.9, adult: Secondary | ICD-10-CM | POA: Diagnosis not present

## 2023-08-11 DIAGNOSIS — M1990 Unspecified osteoarthritis, unspecified site: Secondary | ICD-10-CM | POA: Diagnosis not present

## 2023-08-11 DIAGNOSIS — M4802 Spinal stenosis, cervical region: Secondary | ICD-10-CM | POA: Diagnosis not present

## 2023-08-11 DIAGNOSIS — M80051D Age-related osteoporosis with current pathological fracture, right femur, subsequent encounter for fracture with routine healing: Secondary | ICD-10-CM | POA: Diagnosis not present

## 2023-08-11 DIAGNOSIS — D539 Nutritional anemia, unspecified: Secondary | ICD-10-CM | POA: Diagnosis not present

## 2023-08-11 DIAGNOSIS — G959 Disease of spinal cord, unspecified: Secondary | ICD-10-CM | POA: Diagnosis not present

## 2023-08-11 DIAGNOSIS — G629 Polyneuropathy, unspecified: Secondary | ICD-10-CM | POA: Diagnosis not present

## 2023-08-11 DIAGNOSIS — E440 Moderate protein-calorie malnutrition: Secondary | ICD-10-CM | POA: Diagnosis not present

## 2023-08-11 DIAGNOSIS — Z9181 History of falling: Secondary | ICD-10-CM | POA: Diagnosis not present

## 2023-08-11 DIAGNOSIS — D509 Iron deficiency anemia, unspecified: Secondary | ICD-10-CM | POA: Diagnosis not present

## 2023-08-11 DIAGNOSIS — K5909 Other constipation: Secondary | ICD-10-CM | POA: Diagnosis not present

## 2023-08-11 DIAGNOSIS — Z7901 Long term (current) use of anticoagulants: Secondary | ICD-10-CM | POA: Diagnosis not present

## 2023-08-11 DIAGNOSIS — Z6820 Body mass index (BMI) 20.0-20.9, adult: Secondary | ICD-10-CM | POA: Diagnosis not present

## 2023-08-11 DIAGNOSIS — Z87891 Personal history of nicotine dependence: Secondary | ICD-10-CM | POA: Diagnosis not present

## 2023-09-02 DIAGNOSIS — L309 Dermatitis, unspecified: Secondary | ICD-10-CM | POA: Diagnosis not present

## 2023-09-02 DIAGNOSIS — Z23 Encounter for immunization: Secondary | ICD-10-CM | POA: Diagnosis not present

## 2023-09-08 DIAGNOSIS — S72141D Displaced intertrochanteric fracture of right femur, subsequent encounter for closed fracture with routine healing: Secondary | ICD-10-CM | POA: Diagnosis not present

## 2023-10-08 DIAGNOSIS — L2989 Other pruritus: Secondary | ICD-10-CM | POA: Diagnosis not present

## 2023-10-27 DIAGNOSIS — H16231 Neurotrophic keratoconjunctivitis, right eye: Secondary | ICD-10-CM | POA: Diagnosis not present

## 2023-10-29 DIAGNOSIS — H9201 Otalgia, right ear: Secondary | ICD-10-CM | POA: Diagnosis not present

## 2023-11-02 ENCOUNTER — Other Ambulatory Visit: Payer: Self-pay | Admitting: Family Medicine

## 2023-11-02 DIAGNOSIS — Z0000 Encounter for general adult medical examination without abnormal findings: Secondary | ICD-10-CM | POA: Diagnosis not present

## 2023-11-02 DIAGNOSIS — M810 Age-related osteoporosis without current pathological fracture: Secondary | ICD-10-CM | POA: Diagnosis not present

## 2023-11-02 DIAGNOSIS — Z79899 Other long term (current) drug therapy: Secondary | ICD-10-CM | POA: Diagnosis not present

## 2023-11-02 DIAGNOSIS — Z1231 Encounter for screening mammogram for malignant neoplasm of breast: Secondary | ICD-10-CM

## 2023-11-02 DIAGNOSIS — D72819 Decreased white blood cell count, unspecified: Secondary | ICD-10-CM | POA: Diagnosis not present

## 2023-11-02 DIAGNOSIS — M4802 Spinal stenosis, cervical region: Secondary | ICD-10-CM | POA: Diagnosis not present

## 2023-11-02 DIAGNOSIS — D539 Nutritional anemia, unspecified: Secondary | ICD-10-CM | POA: Diagnosis not present

## 2023-11-02 DIAGNOSIS — Z9181 History of falling: Secondary | ICD-10-CM | POA: Diagnosis not present

## 2023-11-02 DIAGNOSIS — E7800 Pure hypercholesterolemia, unspecified: Secondary | ICD-10-CM | POA: Diagnosis not present

## 2023-11-03 DIAGNOSIS — H16231 Neurotrophic keratoconjunctivitis, right eye: Secondary | ICD-10-CM | POA: Diagnosis not present

## 2023-11-10 DIAGNOSIS — S72141D Displaced intertrochanteric fracture of right femur, subsequent encounter for closed fracture with routine healing: Secondary | ICD-10-CM | POA: Diagnosis not present

## 2023-11-19 DIAGNOSIS — H9201 Otalgia, right ear: Secondary | ICD-10-CM | POA: Diagnosis not present

## 2023-11-19 NOTE — Progress Notes (Signed)
 Otolaryngology Clinic Note  HPI:    New Patient (Right ear pain for about 1 month )    Amanda West is a 77 y.o. female who presents as a new consult , referred by Loreli Joen Hu, *, for evaluation and treatment of right ear pain.  She has been experiencing intermittent right ear pain for the past 15 years, which she typically manages with Sudafed on as needed basis with resolution of symptoms. However, following a wisdom tooth extraction approximately one month ago, the intensity of her ear pain has increased. She reports that her ear pain is exacerbated when lying down on the affected side. She has been applying eardrops, which have provided some relief. She expresses concern about potential liver damage from Tylenol  and kidney damage from Aleve. She states she returned to her dentist due to the severity of her symptoms, at which time a small bone fragment was discovered in her gum. This fragment was subsequently removed, and she was prescribed antibiotics twice daily. She has no history of tympanostomy tube placement but suspects a past eardrum rupture during her childhood.  No other associated symptoms to include hearing loss, vertigo, otorrhea.  PMH/Meds/All/SocHx/FamHx/ROS:   History reviewed. No pertinent past medical history.  Past Surgical History:  Procedure Laterality Date  . SPINE SURGERY     2 times    No family history of bleeding disorders, wound healing problems or difficulty with anesthesia.   Social History   Socioeconomic History  . Marital status: Married    Spouse name: Not on file  . Number of children: Not on file  . Years of education: Not on file  . Highest education level: Not on file  Occupational History  . Not on file  Tobacco Use  . Smoking status: Former    Types: Cigarettes  . Smokeless tobacco: Never  Vaping Use  . Vaping status: Never Used  Substance and Sexual Activity  . Alcohol use: Never  . Drug use: Not on file  . Sexual  activity: Not on file  Other Topics Concern  . Not on file  Social History Narrative  . Not on file   Social Determinants of Health   Food Insecurity: No Food Insecurity (06/13/2023)   Received from [facility]   Food vital sign   . Within the past 12 months, you worried that your food would run out before you got money to buy more: Never true   . Within the past 12 months, the food you bought just didn't last and you didn't have money to get more: Never true  Transportation Needs: No Transportation Needs (06/13/2023)   Received from [facility] - Transportation   . Lack of Transportation (Medical): No   . Lack of Transportation (Non-Medical): No  Safety: Not At Risk (06/13/2023)   Received from [facility]   Safety   . Within the last year, have you been afraid of your partner or ex-partner?: No   . Within the last year, have you been humiliated or emotionally abused in other ways by your partner or ex-partner?: No   . Within the last year, have you been kicked, hit, slapped, or otherwise physically hurt by your partner or ex-partner?: No   . Within the last year, have you been raped or forced to have any kind of sexual activity by your partner or ex-partner?: No  Living Situation: Not on file     Current Outpatient Medications:  .  acetaminophen  (TYLENOL ) 325  mg tablet, Take 650 mg by mouth every 6 (six) hours as needed., Disp: , Rfl:  .  amoxicillin (AMOXIL) 875 mg tablet, 875 mg., Disp: , Rfl:  .  apple cider vinegar 500 mg tab, Take 500 mg by mouth., Disp: , Rfl:  .  cholecalciferol  (VITAMIN D3) 2,000 unit tablet, Take 2,000 Units by mouth daily., Disp: , Rfl:  .  cinnamon 500 mg cap, Take 1,000 mg by mouth every morning., Disp: , Rfl:  .  cyanocobalamin  (VITAMIN B12) 1,000 mcg tablet, Take 1,000 mg by mouth every morning., Disp: , Rfl:  .  DULoxetine  (CYMBALTA ) 20 mg capsule, , Disp: , Rfl:  .  green tea leaf extract (Green Tea) 250 mg cap, Take 1 capsule by mouth every  morning., Disp: , Rfl:  .  senna (SENOKOT) 8.6 mg tablet, Take 2 tablets by mouth., Disp: , Rfl:  .  zinc  gluconate 50 mg tab tablet, Take 50 mg by mouth daily., Disp: , Rfl:   A complete ROS was performed with pertinent positives/negatives noted in the HPI. The remainder of the ROS are negative.    Physical Exam:    Temp 95.9 F (35.5 C)   Ht 1.588 m (5' 2.5)   Wt 54.4 kg (120 lb)   BMI 21.60 kg/m    General: Well developed, well nourished. No acute distress.   Head/Face: Normocephalic, atraumatic. No scars or lesions. No sinus tenderness. Facial nerve intact and equal bilaterally.   Eyes: Pupils are equal, round and reactive to light. Conjunctiva and lids are normal. Normal extraocular mobility.   Ears:   Right: Pinna and external meatus normal, normal ear canal skin and caliber without excessive cerumen or drainage. Tympanic membrane intact without effusion or infection.  Tympanosclerosis noted anteriorly.   Left: Pinna and external meatus normal, normal ear canal skin and caliber without excessive cerumen or drainage. Tympanic membrane intact without effusion or infection.   Nose: No gross deformity or lesions. No purulent discharge. Septum normal, 2+ turbinate hypertrophy with normal mucosa.  Mouth/Oropharynx: Lips normal, teeth and gums normal with good dentition, normal oral vestibule. Normal floor of mouth, tongue and oral mucosa, no mucosal lesions, ulcer or mass, normal tongue mobility. Uvula midline. Hard and soft palate normal with normal mobility. Symmetric tonsils, no erythema or exudate.  Larynx: See TFL if applicable  Nasopharynx: See TFL if applicable  Neck: Trachea midline. No masses. No crepitus. Normal thyroid  glad palpation without thyromegaly or nodules palpated. Salivary glands normal to palpation without swelling, erythema or mass.   Lymphatic: No lymphadenopathy in the neck.  Respiratory: No stridor or distress.  Extremities: No edema or cyanosis. Warm and  well-perfused.  Neurologic: CN II-XII intact. Alert and oriented to self, place and time.  Normal reflexes and motor skills, balance and coordination. Moving all extremities without gross abnormality.  Psychiatric:  No unusual anxiety or evidence of depression. Appropriate affect.    Independent Review of Additional Tests or Records:  Documentation from referring provider reviewed  Procedures:   Binocular Microscopy  Date/Time: 11/19/2023 11:00 AM  Performed by: Gerard Jenkins Shope, DO Authorized by: Gerard Jenkins Shope, DO  Comments: Procedure: Otomicroscopy, bilateral ears  Findings: As per physical exam findings  Details: The patient was placed in a semirecumbent position.  Microscope was utilized with binocular to visualize bilateral tympanic membranes.  Patient tolerated the procedure well with no complications and findings as above.       Impression & Plans:  Erica Richwine is a 77  y.o. female with 5-week history of right-sided otalgia which began after extraction of a right mandibular molar.  Patient states that she was noted to have a retained bony fragment, which was subsequently [place] until weeks after the procedure, recently removed and is currently on oral antibiotic therapy due to concerns for possible infection.  Complete head neck examination today including ear exam under otomicroscopy is unremarkable.  There is no evidence of effusion, infection, excessive cerumen or other focal abnormality of the eardrum or ear canal.  Patient was reassured that her symptoms are likely secondary to referred otalgia from her recent dental procedure.  I recommended alternating Tylenol  and Motrin  for better pain control, and as needed use of warm compresses to the mandibular area for comfort.   Olive oil drops can be used for dryness in the ear. Follow-up with ENT as needed.   Meghan Jenkins Shope, DO Otolaryngology

## 2023-12-23 ENCOUNTER — Ambulatory Visit
Admission: RE | Admit: 2023-12-23 | Discharge: 2023-12-23 | Disposition: A | Discharge status: Home / Self Care | Payer: Medicare Other | Source: Ambulatory Visit | Attending: Family Medicine | Admitting: Family Medicine

## 2023-12-23 DIAGNOSIS — Z1231 Encounter for screening mammogram for malignant neoplasm of breast: Secondary | ICD-10-CM | POA: Diagnosis not present

## 2024-01-07 DIAGNOSIS — H04123 Dry eye syndrome of bilateral lacrimal glands: Secondary | ICD-10-CM | POA: Diagnosis not present

## 2024-01-12 DIAGNOSIS — L821 Other seborrheic keratosis: Secondary | ICD-10-CM | POA: Diagnosis not present

## 2024-01-12 DIAGNOSIS — L820 Inflamed seborrheic keratosis: Secondary | ICD-10-CM | POA: Diagnosis not present

## 2024-01-12 DIAGNOSIS — L2989 Other pruritus: Secondary | ICD-10-CM | POA: Diagnosis not present

## 2024-01-12 DIAGNOSIS — L538 Other specified erythematous conditions: Secondary | ICD-10-CM | POA: Diagnosis not present

## 2024-04-06 DIAGNOSIS — N362 Urethral caruncle: Secondary | ICD-10-CM | POA: Diagnosis not present

## 2024-06-30 ENCOUNTER — Emergency Department (HOSPITAL_COMMUNITY)

## 2024-06-30 ENCOUNTER — Emergency Department (HOSPITAL_COMMUNITY)
Admission: EM | Admit: 2024-06-30 | Discharge: 2024-07-01 | Disposition: A | Discharge status: Home / Self Care | Attending: Emergency Medicine | Admitting: Emergency Medicine

## 2024-06-30 ENCOUNTER — Other Ambulatory Visit: Payer: Self-pay

## 2024-06-30 DIAGNOSIS — S06330A Contusion and laceration of cerebrum, unspecified, without loss of consciousness, initial encounter: Secondary | ICD-10-CM | POA: Diagnosis not present

## 2024-06-30 DIAGNOSIS — M542 Cervicalgia: Secondary | ICD-10-CM | POA: Insufficient documentation

## 2024-06-30 DIAGNOSIS — S199XXA Unspecified injury of neck, initial encounter: Secondary | ICD-10-CM | POA: Diagnosis not present

## 2024-06-30 DIAGNOSIS — Z7901 Long term (current) use of anticoagulants: Secondary | ICD-10-CM | POA: Insufficient documentation

## 2024-06-30 DIAGNOSIS — Z981 Arthrodesis status: Secondary | ICD-10-CM | POA: Diagnosis not present

## 2024-06-30 DIAGNOSIS — I700 Atherosclerosis of aorta: Secondary | ICD-10-CM | POA: Diagnosis not present

## 2024-06-30 DIAGNOSIS — R9431 Abnormal electrocardiogram [ECG] [EKG]: Secondary | ICD-10-CM | POA: Diagnosis not present

## 2024-06-30 DIAGNOSIS — S0003XA Contusion of scalp, initial encounter: Secondary | ICD-10-CM | POA: Insufficient documentation

## 2024-06-30 DIAGNOSIS — S0990XA Unspecified injury of head, initial encounter: Secondary | ICD-10-CM | POA: Diagnosis not present

## 2024-06-30 DIAGNOSIS — W19XXXA Unspecified fall, initial encounter: Secondary | ICD-10-CM | POA: Diagnosis not present

## 2024-06-30 DIAGNOSIS — R58 Hemorrhage, not elsewhere classified: Secondary | ICD-10-CM | POA: Diagnosis not present

## 2024-06-30 NOTE — ED Triage Notes (Signed)
 Pt BIB EMS from home. Pt had mechanical fall over stump, Small hematoma on back of head. no thinners, no LOC.  A&Ox4.   Wearing c collar on arrival, c/o 5/10 head pain   EMS VS 118/82, HR 80, 99% RA, cbg 129

## 2024-06-30 NOTE — ED Provider Notes (Signed)
 [place] [facility] AT [facility] Provider Note   CSN: [id number] Arrival date & time: 06/30/24  2110     Patient presents with: Amanda West   JANIYA MILLIRONS is a 78 y.o. female.  {Add pertinent medical, surgical, social history, OB history to [id number]} Patient to ED after tripping backward over a rose bush earlier today. She hit her head on the concrete but did not lose consciousness. No vomiting. A neighbor helped her get up and EMS was called. She complains of occipital headache and right sided neck pain. Not anticoagulated.   The history is provided by the patient. No language interpreter was used.  Fall       Prior to Admission medications   Medication Sig Start Date End Date Taking? Authorizing Provider  apixaban  (ELIQUIS ) 2.5 MG TABS tablet Take 1 tablet (2.5 mg total) by mouth 2 (two) times daily. 06/16/23 07/16/23  Maree, Pratik D, DO  APPLE CIDER VINEGAR PO Take 500 mg by mouth every evening.    [provider]  Calcium-Magnesium -Zinc  (CAL-MAG-ZINC  PO) Take 1 tablet by mouth in the morning.    [provider]  Capsicum, Cayenne, (CAYENNE PEPPER PO) Take 455 mg by mouth daily.    [provider]  carboxymethylcellulose (REFRESH PLUS) 0.5 % SOLN Place 1 drop into both eyes 2 (two) times daily as needed (for dry eye).    [provider]  Cholecalciferol  (VITAMIN D ) 50 MCG (2000 UT) tablet Take 2,000 Units by mouth daily.    [provider]  Cinnamon 500 MG capsule Take 1,000 mg by mouth in the morning.    [provider]  COLLAGEN PO Take 500 mg by mouth every evening.    [provider]  docusate sodium  (COLACE) 100 MG capsule Take 1 capsule (100 mg total) by mouth 2 (two) times daily. 06/16/23   Maree, Pratik D, DO  DULoxetine  (CYMBALTA ) 20 MG capsule TAKE 1 CAPSULE BY MOUTH ONCE DAILY Patient taking differently: Take 20 mg by mouth every other day. At bedtime. 10/07/18   Sherryl Bouchard, NP  GREEN  TEA, CAMELLIA SINENSIS, PO Take 1 capsule by mouth in the morning.    [provider]  hydrOXYzine  (ATARAX ) 25 MG tablet Take 1 tablet (25 mg total) by mouth 3 (three) times daily as needed for itching. 06/16/23   Maree, Pratik D, DO  MELATONIN PO Take 25 mg by mouth at bedtime as needed (sleep).    [provider]  methocarbamol  (ROBAXIN ) 500 MG tablet Take 1 tablet (500 mg total) by mouth every 6 (six) hours as needed for muscle spasms. 06/16/23   Maree, Pratik D, DO  oxyCODONE  (OXY IR/ROXICODONE ) 5 MG immediate release tablet Take 1 tablet (5 mg total) by mouth every 4 (four) hours as needed for moderate pain, severe pain or breakthrough pain. 06/16/23   Maree, Pratik D, DO  polyethylene glycol (MIRALAX  / GLYCOLAX ) 17 g packet Take 17 g by mouth daily as needed for mild constipation. 06/16/23   Maree, Pratik D, DO  Potassium 99 MG TABS Take 99 mg by mouth every evening.    [provider]  senna (SENOKOT) 8.6 MG tablet Take 4 tablets by mouth at bedtime.    [provider]  vitamin B-12 (CYANOCOBALAMIN ) 1000 MCG tablet Take 1,000 mcg by mouth in the morning.    [provider]  vitamin C  (ASCORBIC ACID ) 500 MG tablet Take 500 mg by mouth in the morning.    [provider]  zinc  gluconate 50 MG tablet Take 50 mg by mouth daily.    [provider]    Allergies: Codeine and Pamelor  [nortriptyline ]    Review of Systems  Updated Vital Signs BP (!) 151/65   Pulse 66   Temp 98.2 F (36.8 C) (Oral)   Resp 16   SpO2 100%   Physical Exam Vitals and nursing note reviewed.  Constitutional:      Appearance: Normal appearance. She is well-developed.     Comments: Frail  HENT:     Head: Normocephalic.     Comments: Occipital abrasion and hematoma Neck:     Comments: No midline cervical tenderness. Collar in place Cardiovascular:     Rate and Rhythm: Normal rate.  Pulmonary:     Effort: Pulmonary effort is normal.  Chest:     Chest wall:  No tenderness.  Abdominal:     Palpations: Abdomen is soft.     Tenderness: There is no abdominal tenderness. There is no guarding or rebound.  Musculoskeletal:        General: Normal range of motion.     Cervical back: Normal range of motion and neck supple.     Comments: FROM all extremities. No pelvic tenderness.   Skin:    General: Skin is warm and dry.  Neurological:     General: No focal deficit present.     Mental Status: She is alert and oriented to person, place, and time.     (all labs ordered are listed, but only abnormal results are displayed) Labs Reviewed - No data to display  EKG: None  Radiology: CT Head Wo Contrast Result Date: 06/30/2024 CLINICAL DATA:  Head trauma, minor (Age >= 65y); Neck trauma (Age >= 65y). Fall hematoma back of head EXAM: CT HEAD WITHOUT CONTRAST CT CERVICAL SPINE WITHOUT CONTRAST TECHNIQUE: Multidetector CT imaging of the head and cervical spine was performed following the standard protocol without intravenous contrast. Multiplanar CT image reconstructions of the cervical spine were also generated. RADIATION DOSE REDUCTION: This exam was performed according to the departmental dose-optimization program which includes automated exposure control, adjustment of the mA and/or kV according to patient size and/or use of iterative reconstruction technique. COMPARISON:  None Available. FINDINGS: CT HEAD FINDINGS Brain: Patchy and confluent areas of decreased attenuation are noted throughout the deep and periventricular white matter of the cerebral hemispheres bilaterally, compatible with chronic microvascular ischemic disease. No evidence of large-territorial acute infarction. No parenchymal hemorrhage. No mass lesion. No extra-axial collection. No mass effect or midline shift. No hydrocephalus. Basilar cisterns are patent. Vascular: No hyperdense vessel. Atherosclerotic calcifications are present within the cavernous internal carotid arteries. Skull: No acute  fracture or focal lesion. Sinuses/Orbits: Paranasal sinuses and mastoid air cells are clear. The orbits are unremarkable. Other: 5 mm midline occipital scalp hematoma. CT CERVICAL SPINE FINDINGS Alignment: Normal. Skull base and vertebrae: Diffusely decreased bone density. C3-C4 anterior cervical discectomy and fusion. C4-C7 anterior cervical discectomy and fusion. Multilevel moderate degenerative change of the spine. No associated severe osseous neural foraminal or central canal stenosis. No acute fracture. No aggressive appearing focal osseous lesion or focal pathologic process. Soft tissues and spinal canal: No prevertebral fluid or swelling. No visible canal hematoma. Upper chest: Biapical pleural/pulmonary scarring. Other: Atherosclerotic plaque of the carotid arteries within the neck. IMPRESSION: 1. No acute intracranial abnormality. 2. No acute displaced fracture or traumatic listhesis of the cervical spine. 3.  Aortic Atherosclerosis (ICD10-I70.0). Electronically Signed   By: Morgane  Margarite M.D.   On: 06/30/2024 22:54   CT Cervical Spine Wo Contrast Result Date: 06/30/2024 CLINICAL DATA:  Head trauma, minor (Age >= 65y); Neck trauma (Age >= 65y). Fall hematoma back of head EXAM: CT HEAD WITHOUT CONTRAST CT CERVICAL SPINE WITHOUT CONTRAST TECHNIQUE: Multidetector CT imaging of the head and cervical spine was performed following the standard protocol without intravenous contrast. Multiplanar CT image reconstructions of the cervical spine were also generated. RADIATION DOSE REDUCTION: This exam was performed according to the departmental dose-optimization program which includes automated exposure control, adjustment of the mA and/or kV according to patient size and/or use of iterative reconstruction technique. COMPARISON:  None Available. FINDINGS: CT HEAD FINDINGS Brain: Patchy and confluent areas of decreased attenuation are noted throughout the deep and periventricular white matter of the cerebral  hemispheres bilaterally, compatible with chronic microvascular ischemic disease. No evidence of large-territorial acute infarction. No parenchymal hemorrhage. No mass lesion. No extra-axial collection. No mass effect or midline shift. No hydrocephalus. Basilar cisterns are patent. Vascular: No hyperdense vessel. Atherosclerotic calcifications are present within the cavernous internal carotid arteries. Skull: No acute fracture or focal lesion. Sinuses/Orbits: Paranasal sinuses and mastoid air cells are clear. The orbits are unremarkable. Other: 5 mm midline occipital scalp hematoma. CT CERVICAL SPINE FINDINGS Alignment: Normal. Skull base and vertebrae: Diffusely decreased bone density. C3-C4 anterior cervical discectomy and fusion. C4-C7 anterior cervical discectomy and fusion. Multilevel moderate degenerative change of the spine. No associated severe osseous neural foraminal or central canal stenosis. No acute fracture. No aggressive appearing focal osseous lesion or focal pathologic process. Soft tissues and spinal canal: No prevertebral fluid or swelling. No visible canal hematoma. Upper chest: Biapical pleural/pulmonary scarring. Other: Atherosclerotic plaque of the carotid arteries within the neck. IMPRESSION: 1. No acute intracranial abnormality. 2. No acute displaced fracture or traumatic listhesis of the cervical spine. 3.  Aortic Atherosclerosis (ICD10-I70.0). Electronically Signed   By: Morgane  Naveau M.D.   On: 06/30/2024 22:54    {Document cardiac monitor, telemetry assessment procedure when appropriate:32947} Procedures   Medications Ordered in the ED - No data to display  Clinical Course as of 06/30/24 2312  Thu Jun 30, 2024  2218 Patient to ED after mechanical fall after tripping, hit head on concrete. No LOC, nausea. Not anticoagulated though Eliquis  is listed on her meds list, taken after hip surgery last year only.  [SU]  2312 Per radiology interpretation of CT head and  c-spine:  IMPRESSION: 1. No acute intracranial abnormality. 2. No acute displaced fracture or traumatic listhesis of the cervical spine. 3.  Aortic Atherosclerosis (ICD10-I70.0).  Collar removed.  [SU]    Clinical Course User Index [SU] Odell Balls, PA-C   {Click here for ABCD2, HEART and other calculators REFRESH Note before signing:1}                              Medical Decision Making Amount and/or Complexity of Data Reviewed Radiology: ordered.   ***  {Document critical care time when appropriate  Document review of labs and clinical decision tools ie CHADS2VASC2, etc  Document your independent review of radiology images and any outside records  Document your discussion with family members, caretakers and with consultants  Document social determinants of health affecting pt's care  Document your decision making why or why not admission, treatments were needed:32947:::1}   Final diagnoses:  None    ED Discharge Orders     None

## 2024-06-30 NOTE — ED Notes (Signed)
Pt ambulated to restroom independently with steady gate.

## 2024-07-01 NOTE — ED Notes (Signed)
 20G Iv removed from right forearm. Pt discharged with friend

## 2024-07-01 NOTE — Discharge Instructions (Addendum)
 Follow up with Dr. Loreli as needed. Tylenol  and/or ibuprofen  for any discomfort or soreness.   Return to the ED with any new or concerning symptoms at any time.

## 2024-07-15 DIAGNOSIS — M4802 Spinal stenosis, cervical region: Secondary | ICD-10-CM | POA: Diagnosis not present

## 2024-07-15 DIAGNOSIS — S060X9A Concussion with loss of consciousness of unspecified duration, initial encounter: Secondary | ICD-10-CM | POA: Diagnosis not present

## 2024-07-29 ENCOUNTER — Other Ambulatory Visit (HOSPITAL_COMMUNITY)
Admission: RE | Admit: 2024-07-29 | Discharge: 2024-07-29 | Disposition: A | Discharge status: Home / Self Care | Source: Ambulatory Visit | Attending: Obstetrics | Admitting: Obstetrics

## 2024-07-29 ENCOUNTER — Ambulatory Visit (INDEPENDENT_AMBULATORY_CARE_PROVIDER_SITE_OTHER): Admitting: Obstetrics

## 2024-07-29 ENCOUNTER — Encounter: Payer: Self-pay | Admitting: Obstetrics

## 2024-07-29 VITALS — BP 115/76 | HR 65 | Ht 61.5 in | Wt 112.4 lb

## 2024-07-29 DIAGNOSIS — N952 Postmenopausal atrophic vaginitis: Secondary | ICD-10-CM | POA: Diagnosis not present

## 2024-07-29 DIAGNOSIS — N3941 Urge incontinence: Secondary | ICD-10-CM

## 2024-07-29 DIAGNOSIS — R351 Nocturia: Secondary | ICD-10-CM

## 2024-07-29 DIAGNOSIS — N362 Urethral caruncle: Secondary | ICD-10-CM

## 2024-07-29 DIAGNOSIS — R82998 Other abnormal findings in urine: Secondary | ICD-10-CM | POA: Diagnosis present

## 2024-07-29 LAB — POCT URINALYSIS DIPSTICK
Bilirubin, UA: NEGATIVE
Blood, UA: NEGATIVE
Glucose, UA: NEGATIVE
Ketones, UA: NEGATIVE
Leukocytes, UA: NEGATIVE
Nitrite, UA: NEGATIVE
Protein, UA: NEGATIVE
Spec Grav, UA: 1.02 (ref 1.010–1.025)
Urobilinogen, UA: 0.2 U/dL
pH, UA: 6.5 (ref 5.0–8.0)

## 2024-07-29 MED ORDER — ESTRADIOL 0.1 MG/GM VA CREA: TOPICAL_CREAM | VAGINAL | 3 refills | Status: AC

## 2024-07-29 NOTE — Patient Instructions (Addendum)
 For vaginal atrophy (thinning of the vaginal tissue that can cause dryness and burning) and UTI prevention we discussed estrogen replacement in the form of vaginal cream.   Start vaginal estrogen therapy nightly for two weeks then 2 times weekly at night. This can be placed with your finger or an applicator inside the vagina and around the urethra.  Please let us  know if the prescription is too expensive and we can look for alternative options.   Is vaginal estrogen therapy safe for me? Vaginal estrogen preparations act on the vaginal skin, and only a very tiny amount is absorbed into the bloodstream (0.01%).  They work in a similar way to hand or face cream.  There is minimal absorption and they are therefore perfectly safe. If you have had breast cancer and have persistent troublesome symptoms which aren't settling with vaginal moisturisers and lubricants, local estrogen treatment may be a possibility, but consultation with your oncologist should take place first.   Constipation: Our goal is to achieve formed bowel movements daily or every-other-day.  You may need to try different combinations of the following options to find what works best for you - everybody's body works differently so feel free to adjust the dosages as needed.  Some options to help maintain bowel health include:  Dietary changes (more leafy greens, vegetables and fruits; less processed foods) Fiber supplementation (Benefiber, FiberCon, Metamucil or Psyllium). Start slow and increase gradually to full dose. Over-the-counter agents such as: stool softeners (Docusate or Colace) and/or laxatives (Miralax , milk of magnesia)  Power Pudding is a natural mixture that may help your constipation.  To make blend 1 cup applesauce, 1 cup wheat bran, and 3/4 cup prune juice, refrigerate and then take 1 tablespoon daily with a large glass of water as needed.   Women should try to eat at least 21 to 25 grams of fiber a day, while men should  aim for 30 to 38 grams a day. You can add fiber to your diet with food or a fiber supplement such as psyllium (metamucil), benefiber, or fibercon.   Here's a look at how much dietary fiber is found in some common foods. When buying packaged foods, check the Nutrition Facts label for fiber content. It can vary among brands.  Fruits Serving size Total fiber (grams)*  Raspberries 1 cup 8.0  Pear 1 medium 5.5  Apple, with skin 1 medium 4.5  Banana 1 medium 3.0  Orange 1 medium 3.0  Strawberries 1 cup 3.0   Vegetables Serving size Total fiber (grams)*  Green peas, boiled 1 cup 9.0  Broccoli, boiled 1 cup chopped 5.0  Turnip greens, boiled 1 cup 5.0  Brussels sprouts, boiled 1 cup 4.0  Potato, with skin, baked 1 medium 4.0  Sweet corn, boiled 1 cup 3.5  Cauliflower, raw 1 cup chopped 2.0  Carrot, raw 1 medium 1.5   Grains Serving size Total fiber (grams)*  Spaghetti, whole-wheat, cooked 1 cup 6.0  Barley, pearled, cooked 1 cup 6.0  Bran flakes 3/4 cup 5.5  Quinoa, cooked 1 cup 5.0  Oat bran muffin 1 medium 5.0  Oatmeal, instant, cooked 1 cup 5.0  Popcorn, air-popped 3 cups 3.5  Brown rice, cooked 1 cup 3.5  Bread, whole-wheat 1 slice 2.0  Bread, rye 1 slice 2.0   Legumes, nuts and seeds Serving size Total fiber (grams)*  Split peas, boiled 1 cup 16.0  Lentils, boiled 1 cup 15.5  Black beans, boiled 1 cup 15.0  Baked beans, canned 1  cup 10.0  Chia seeds 1 ounce 10.0  Almonds 1 ounce (23 nuts) 3.5  Pistachios 1 ounce (49 nuts) 3.0  Sunflower kernels 1 ounce 3.0  *Rounded to nearest 0.5 gram. Source: [employer] for [employer], Legacy Release    We discussed the symptoms of overactive bladder (OAB), which include urinary urgency, urinary frequency, night-time urination, with or without urge incontinence.  We discussed management including behavioral therapy (decreasing bladder irritants by following a bladder diet, urge suppression strategies, timed  voids, bladder retraining), physical therapy, medication; and for refractory cases posterior tibial nerve stimulation, sacral neuromodulation, and intravesical botulinum toxin injection.   Reduce your caffeine intake

## 2024-07-29 NOTE — Assessment & Plan Note (Signed)
-   denies bother - avoid fluid intake 3 hours before bedtime - denies leg swelling or snoring

## 2024-07-29 NOTE — Assessment & Plan Note (Addendum)
-   6-51mm urethral caruncle noted on exam, findings reviewed with patient and husband  - no signs of infection, bleeding, or outlet obstruction with PVR 25mL - encouraged pt to monitor for clinical change that warrants immediate evaluation, discussed management options include expectant mgmt, vaginal estrogen, or surgical intervention if symptomatic - reassured patient and husband that they can resume intercourse  - discussed proper vulvar care, warm compression, avoid pad use, cotton only underwear and barrier ointment if needed. Pad dry - encouraged Vit E suppository, moisturizer with Replens/Revaree - start low dose vaginal estrogen

## 2024-07-29 NOTE — Assessment & Plan Note (Addendum)
-   POCT UA + leuk, pending culture. Denies UTI symptoms - We discussed the symptoms of overactive bladder (OAB), which include urinary urgency, urinary frequency, nocturia, with or without urge incontinence.  While we do not know the exact etiology of OAB, several treatment options exist. We discussed management including behavioral therapy (decreasing bladder irritants, urge suppression strategies, timed voids, bladder retraining), physical therapy, medication; for refractory cases posterior tibial nerve stimulation, sacral neuromodulation, and intravesical botulinum toxin injection.  For anticholinergic medications, we discussed the potential side effects of anticholinergics including dry eyes, dry mouth, constipation, cognitive impairment and urinary retention. For Beta-3 agonist medication, we discussed the potential side effect of elevated blood pressure which is more likely to occur in individuals with uncontrolled hypertension. - encouraged reduction of caffeine and fluid management - desires expectant management at this time

## 2024-07-29 NOTE — Progress Notes (Signed)
 New Patient Evaluation and Consultation  Referring Provider: Letha Savant, DO PCP: Loreli Kins, MD Date of Service: 07/29/2024  SUBJECTIVE Chief Complaint: New Patient (Initial Visit) Amanda West is a 78 y.o. female here today for evaluation of abnormal urination and urethral caruncle.  )  History of Present Illness: Amanda West is a 78 y.o. White or Caucasian female seen in consultation at the request of Dr Letha for evaluation of abnormal urination and urethral caruncle.    Reports pea size vaginal bulge noted due to decrease force of urinary stream with straining, presented to the PCP and referred to Dr. Letha. Denies dysuria, hematuria, or sensation of incomplete emptying.  Reports sensation of blockage in the vagina, 3cm vascular urethral caruncle noted on exam 04/06/24 with spotting Clemens and broke her femur 06/13/23 and underwent cephalomedullary nailing of right intertrochanteric femur fracture by Dr. Kendal in 06/14/23.  Underwent C3-4 anterior cervical discectomy with interbody fusion utilizing interbody cage, will curbside autograft, and anterior plate instrumentation by Dr. Louis in 05/27/22.  Reports weak urinary stream ED evaluation 06/30/24 for fall and hitting her head on concrete. Negative CT head and C-spine.   Review of records significant for: Gait abnormality, macrocytic anemia, potassium supplementation, shingles  Urinary Symptoms: Leaks urine with with a full bladder and with urgency started 4 months ago Leaks 2 time(s) per days.  Denies pad use, previously 1 panty liner/day due to spotting that has now resolved.   Patient is not bothered by UI symptoms.  Day time voids 4.  Nocturia: 2 times per night to void. Drinks fluid until bedtime, denies bother from night time frequency Denies leg swelling or snoring  Voiding dysfunction:  does not empty bladder well.  Patient does not use a catheter to empty bladder.  When urinating, patient feels a  weak stream and difficulty starting urine stream Drinks: 32oz water per day, 32oz coffee  UTIs: 0 UTI's in the last year.   Denies history of blood in urine, kidney or bladder stones, pyelonephritis, bladder cancer, and kidney cancer No results found for the last 90 days.   Pelvic Organ Prolapse Symptoms:                  Patient Admits to a feeling of a bulge the vaginal area. It has been present for 4 months.  Patient Admits to seeing a bulge.  This bulge is bothersome.  Bowel Symptom: Bowel movements: 1 time(s) per day with constipation Stool consistency: loose type VII stool with senna use for 10 years Straining: no.  Splinting: no.  Incomplete evacuation: no.  Patient Denies accidental bowel leakage / fecal incontinence Bowel regimen: 35-38g dietary fiber, stool softener, and miralax  1x/month, PRN senna daily Last colonoscopy: Date 2020 per pt with no results available for review, last in 02/16/2003 with melanosis coli secondary to laxative use, pending repeat delayed due to broken femur [facility] Colonoscopy   This patient has no relevant Health Maintenance data.     Sexual Function Sexually active: no.  Sexual orientation: Straight Pain with sex: at the vaginal opening, deep in the pelvis, has discomfort due to dryness managed by Vaseline   Pelvic Pain Denies pelvic pain   Past Medical History:  Past Medical History:  Diagnosis Date   Abnormality of gait    previous was seen by neurology-- dr c. jenel--- relased lov 08-31-2019, much improved per pt   Arthritis    Chronic constipation    Cornea scar    right eye  Eyes sensitive to light, bilateral    per pt right > left due to residual shingles    Femur fracture (HCC)    IDA (iron deficiency anemia)    Neuromuscular disorder (HCC)    Neuropathy   Osteoporosis    Spinal stenosis in cervical region 09/15/2014   Subcutaneous mass    bilateral abdominal    Vitamin D  deficiency    Wears glasses      Past  Surgical History:   Past Surgical History:  Procedure Laterality Date   ABDOMINAL HERNIA REPAIR  1980s   ANTERIOR CERVICAL DECOMP/DISCECTOMY FUSION N/A 09/21/2017   Procedure: Anterior Cervical Decompression Fusion Cervical Four-Five,Cervical Five-Six,Cervical Six-Seven;  Surgeon: Louis Shove, MD;  Location: [facility] OR;  Service: Neurosurgery;  Laterality: N/A;  anterior    ANTERIOR CERVICAL DECOMP/DISCECTOMY FUSION N/A 05/27/2022   Procedure: CERVICAL THREE-FOUR ANTERIOR CERVICAL DECOMPRESSION/DISCECTOMY FUSION;  Surgeon: Louis Shove, MD;  Location: [facility] OR;  Service: Neurosurgery;  Laterality: N/A;  3C   BACK SURGERY     CATARACT EXTRACTION W/ INTRAOCULAR LENS  IMPLANT, BILATERAL  1990's   CERVICAL THREE-FOUR ANTERIOR CERVICAL DECOMPRESSION/DISCECTOMY FUSION     INTRAMEDULLARY (IM) NAIL INTERTROCHANTERIC Right 06/14/2023   Procedure: INTRAMEDULLARY (IM) NAIL INTERTROCHANTERIC;  Surgeon: Kendal Franky SQUIBB, MD;  Location: [facility] OR;  Service: Orthopedics;  Laterality: Right;   MASS EXCISION N/A 12/19/2020   Procedure: EXCISION OF RIGHT LOWER ABDOMEN SUBCUTANEOUS MASS, EXCISION OF LEFT LOWER ABDOMEN SUBCUTANEOUS MASS;  Surgeon: Lyndel Deward PARAS, MD;  Location: [facility];  Service: General;  Laterality: N/A;   ORIF FEMUR FRACTURE     PARS PLANA VITRECTOMY  07/06/2012   Procedure: PARS PLANA VITRECTOMY WITH 25G REMOVAL/SUTURE INTRAOCULAR LENS;  Surgeon: Norleen JONETTA Ku, MD;  Location: [facility] OR;  Service: Ophthalmology;  Laterality: Left;   TUBAL LIGATION     VITRECTOMY AND CATARACT Right ?   IOL replaced     Past OB/GYN History: OB History  Gravida Para Term Preterm AB Living  1 1 1   1   SAB IAB Ectopic Multiple Live Births      1    # Outcome Date GA Lbr Len/2nd Weight Sex Type Anes PTL Lv  1 Term     F Vag-Spont   LIV    Vaginal deliveries: 7lb6oz,  Forceps/ Vacuum deliveries: 0, Cesarean section: 0 Menopausal: Yes, at age 88, Denies vaginal bleeding since  menopause Contraception: tubal ligation. Last pap smear was NILM 2014 at 78yo.  Any history of abnormal pap smears: no. No results found for: DIAGPAP, HPVHIGH, ADEQPAP  Medications: Patient has a current medication list which includes the following prescription(s): apple cider vinegar, calcium-magnesium -zinc , capsicum (cayenne), carboxymethylcellulose, vitamin d , cinnamon, coconut oil, collagen-vitamin c -biotin, docusate sodium , duloxetine , [START ON 08/01/2024] estradiol , green tea (camellia sinensis), polyethylene glycol, senna, cyanocobalamin , ascorbic acid , and zinc  gluconate.   Allergies: Patient is allergic to codeine and pamelor  [nortriptyline ].   Social History:  Social History   Tobacco Use   Smoking status: Former    Current packs/day: 0.00    Types: Cigarettes    Start date: 11/17/1983    Quit date: 11/16/2013    Years since quitting: 10.7   Smokeless tobacco: Never  Vaping Use   Vaping status: Never Used  Substance Use Topics   Alcohol use: No   Drug use: Never    Relationship status: married Patient lives with Robynn Baptist.   Patient is employed in [employer]. Regular exercise: Yes: walking, and riding bikes History of abuse:  No  Family History:   Family History  Problem Relation Age of Onset   Heart disease Mother    Heart disease Father    Diabetes Sister    Cancer Brother    Heart disease Brother      Review of Systems: Review of Systems  Constitutional:  Negative for fever, malaise/fatigue and weight loss.  Respiratory:  Negative for cough, shortness of breath and wheezing.   Cardiovascular:  Negative for chest pain, palpitations and leg swelling.  Gastrointestinal:  Negative for abdominal pain, blood in stool and constipation.  Genitourinary:  Positive for frequency and urgency. Negative for dysuria and hematuria.       Leakage  Skin:  Negative for rash.  Neurological:  Negative for dizziness, weakness and headaches.  Endo/Heme/Allergies:   Bruises/bleeds easily.  Psychiatric/Behavioral:  Negative for depression. The patient is not nervous/anxious.      OBJECTIVE Physical Exam: Vitals:   07/29/24 1048  BP: 115/76  Pulse: 65  Weight: 112 lb 6.4 oz (51 kg)  Height: 5' 1.5 (1.562 m)   Physical Exam Constitutional:      General: She is not in acute distress.    Appearance: Normal appearance.  Genitourinary:     Bladder and urethral meatus normal.     No lesions in the vagina.     Genitourinary Comments: 6-1mm polypoid lesion from posterior urethral meatus, nontender, no erythema or discharge with palpation     Right Labia: No rash, tenderness, lesions, skin changes or Bartholin's cyst.    Left Labia: No tenderness, lesions, skin changes, Bartholin's cyst or rash.       No vaginal discharge, erythema, tenderness, bleeding, ulceration or granulation tissue.     Moderate vaginal atrophy present.     Right Adnexa: not tender, not full and no mass present.    Left Adnexa: not tender, not full and no mass present.    No cervical motion tenderness, discharge, friability, lesion, polyp or nabothian cyst.     Uterus is not enlarged, fixed, tender, irregular or prolapsed.     No uterine mass detected.    Urethral meatus caruncle present.    No urethral prolapse, tenderness, mass, hypermobility, discharge or stress urinary incontinence with cough stress test present.     Bladder is not tender, urgency on palpation not present and masses not present.      Pelvic Floor: Levator muscle strength is 3/5.    Levator ani not tender, obturator internus not tender, no asymmetrical contractions present and no pelvic spasms present.    Symmetrical pelvic sensation, anal wink present and BC reflex present. Cardiovascular:     Rate and Rhythm: Normal rate.  Pulmonary:     Effort: Pulmonary effort is normal. No respiratory distress.  Abdominal:     General: There is no distension.     Palpations: Abdomen is soft. There is no mass.      Tenderness: There is no abdominal tenderness.     Hernia: No hernia is present.   Neurological:     Mental Status: She is alert.  Vitals reviewed. Exam conducted with a chaperone present.     POP-Q:   POP-Q  -2                                            Aa   -2  Ba  -8                                              C   1                                            Gh  3                                            Pb  6                                            tvl   -1                                            Ap  -1                                            Bp  -8                                              D    Post-Void Residual (PVR) by Bladder Scan: In order to evaluate bladder emptying, we discussed obtaining a postvoid residual and patient agreed to this procedure.  Procedure: The ultrasound unit was placed on the patient's abdomen in the suprapubic region after the patient had voided.    Post Void Residual - 07/29/24 1100       Post Void Residual   Post Void Residual 25 mL           Laboratory Results: Lab Results  Component Value Date   COLORU Yellow 07/29/2024   CLARITYU Clear 07/29/2024   GLUCOSEUR Negative 07/29/2024   BILIRUBINUR Negative 07/29/2024   KETONESU Negative 07/29/2024   SPECGRAV 1.020 07/29/2024   RBCUR Negative 07/29/2024   PHUR 6.5 07/29/2024   PROTEINUR Negative 07/29/2024   UROBILINOGEN 0.2 07/29/2024   LEUKOCYTESUR Negative 07/29/2024    Lab Results  Component Value Date   CREATININE 0.55 06/18/2023   CREATININE 0.60 06/16/2023   CREATININE 0.53 06/15/2023    No results found for: HGBA1C  Lab Results  Component Value Date   HGB 7.4 (L) 06/18/2023     ASSESSMENT AND PLAN Ms. Urias is a 78 y.o. with:  1. Urge urinary incontinence   2. Nocturia   3. Vaginal atrophy   4. Urethral caruncle     Urge urinary incontinence Assessment & Plan: - POCT UA + leuk,  pending culture. Denies UTI symptoms - We discussed the symptoms of overactive bladder (OAB), which include urinary urgency, urinary frequency, nocturia, with or without urge incontinence.  While we do not know  the exact etiology of OAB, several treatment options exist. We discussed management including behavioral therapy (decreasing bladder irritants, urge suppression strategies, timed voids, bladder retraining), physical therapy, medication; for refractory cases posterior tibial nerve stimulation, sacral neuromodulation, and intravesical botulinum toxin injection.  For anticholinergic medications, we discussed the potential side effects of anticholinergics including dry eyes, dry mouth, constipation, cognitive impairment and urinary retention. For Beta-3 agonist medication, we discussed the potential side effect of elevated blood pressure which is more likely to occur in individuals with uncontrolled hypertension. - encouraged reduction of caffeine and fluid management - desires expectant management at this time  Orders: -     Urine Culture; Future  Nocturia Assessment & Plan: - denies bother - avoid fluid intake 3 hours before bedtime - denies leg swelling or snoring  Orders: -     POCT urinalysis dipstick  Vaginal atrophy Assessment & Plan: - discomfort during intercourse due to dryness, managed by Vaseline - For symptomatic vaginal atrophy options include lubrication with a water-based lubricant, personal hygiene measures and barrier protection against wetness, and estrogen replacement in the form of vaginal cream, vaginal tablets, or a time-released vaginal ring.   - Rx to start low dose vaginal estrogen - encouraged lubrication use for comfort with samples provided   Orders: -     Estradiol ; Place 0.5g nightly for two weeks then twice a week after  Dispense: 30 g; Refill: 3  Urethral caruncle Assessment & Plan: - 6-45mm urethral caruncle noted on exam, findings reviewed with  patient and husband  - no signs of infection, bleeding, or outlet obstruction with PVR 25mL - encouraged pt to monitor for clinical change that warrants immediate evaluation, discussed management options include expectant mgmt, vaginal estrogen, or surgical intervention if symptomatic - reassured patient and husband that they can resume intercourse  - discussed proper vulvar care, warm compression, avoid pad use, cotton only underwear and barrier ointment if needed. Pad dry - encouraged Vit E suppository, moisturizer with Replens/Revaree - start low dose vaginal estrogen   Time spent: I spent 71 minutes dedicated to the care of this patient on the date of this encounter to include pre-visit review of records, face-to-face time with the patient discussing urethral caruncle, vaginal atrophy, nocturia, urgency urinary incontinence, and post visit documentation and ordering medication/ testing.   Lianne ONEIDA Gillis, MD

## 2024-07-29 NOTE — Assessment & Plan Note (Signed)
-   discomfort during intercourse due to dryness, managed by Vaseline - For symptomatic vaginal atrophy options include lubrication with a water-based lubricant, personal hygiene measures and barrier protection against wetness, and estrogen replacement in the form of vaginal cream, vaginal tablets, or a time-released vaginal ring.   - Rx to start low dose vaginal estrogen - encouraged lubrication use for comfort with samples provided

## 2024-07-30 LAB — URINE CULTURE: Culture: <10000 — AB

## 2024-08-02 ENCOUNTER — Ambulatory Visit: Payer: Self-pay | Admitting: Obstetrics

## 2024-09-24 DIAGNOSIS — Z23 Encounter for immunization: Secondary | ICD-10-CM | POA: Diagnosis not present

## 2024-09-27 DIAGNOSIS — R42 Dizziness and giddiness: Secondary | ICD-10-CM | POA: Diagnosis not present

## 2024-09-29 DIAGNOSIS — H16231 Neurotrophic keratoconjunctivitis, right eye: Secondary | ICD-10-CM | POA: Diagnosis not present

## 2024-10-03 DIAGNOSIS — H16231 Neurotrophic keratoconjunctivitis, right eye: Secondary | ICD-10-CM | POA: Diagnosis not present

## 2024-10-28 ENCOUNTER — Ambulatory Visit: Admitting: Obstetrics

## 2024-10-28 ENCOUNTER — Encounter: Payer: Self-pay | Admitting: Obstetrics

## 2024-10-28 VITALS — BP 111/68 | HR 69

## 2024-10-28 DIAGNOSIS — N952 Postmenopausal atrophic vaginitis: Secondary | ICD-10-CM

## 2024-10-28 DIAGNOSIS — N362 Urethral caruncle: Secondary | ICD-10-CM | POA: Diagnosis not present

## 2024-10-28 NOTE — Assessment & Plan Note (Signed)
-   discomfort during intercourse due to dryness, managed by Vaseline - For symptomatic vaginal atrophy options include lubrication with a water-based lubricant, personal hygiene measures and barrier protection against wetness, and estrogen replacement in the form of vaginal cream, vaginal tablets, or a time-released vaginal ring.   - loading dose of low dose vaginal estrogen with cramping - encouraged lubrication use for comfort  - consider resuming at 1g twice a week maintenance dose if symptoms becomes bothersome

## 2024-10-28 NOTE — Assessment & Plan Note (Signed)
-   stable 6-37mm urethral caruncle noted on exam, findings reviewed with patient   - no signs of infection, bleeding, or outlet obstruction with prior PVR 25mL - encouraged pt to monitor for clinical change that warrants immediate evaluation, discussed management options include expectant mgmt, vaginal estrogen, or surgical intervention if symptomatic - reassured patient and husband that they can resume intercourse  - discussed proper vulvar care, warm compression, avoid pad use, cotton only underwear and barrier ointment if needed. Pad dry - encouraged Vit E suppository, moisturizer with Replens/Revaree - discontinued low dose vaginal estrogen due to abdominal cramping, discussed possible resumption at maintenance dose if symptoms develop

## 2024-10-28 NOTE — Patient Instructions (Addendum)
 Please return if you experience any change in urinary or vaginal symptoms.   - discussed proper vulvar care, warm compression, avoid pad use, cotton only underwear and barrier ointment if needed   Resume vaginal estrogen 1g twice a week if you experience any change in symptoms.

## 2024-10-28 NOTE — Assessment & Plan Note (Signed)
-   07/29/24 POCT UA + leuk,  <10K CFU on culture. Denies UTI symptoms - denies bother with reduction of UUI episodes when she does not postpone urge - We discussed the symptoms of overactive bladder (OAB), which include urinary urgency, urinary frequency, nocturia, with or without urge incontinence.  While we do not know the exact etiology of OAB, several treatment options exist. We discussed management including behavioral therapy (decreasing bladder irritants, urge suppression strategies, timed voids, bladder retraining), physical therapy, medication; for refractory cases posterior tibial nerve stimulation, sacral neuromodulation, and intravesical botulinum toxin injection.  For anticholinergic medications, we discussed the potential side effects of anticholinergics including dry eyes, dry mouth, constipation, cognitive impairment and urinary retention. For Beta-3 agonist medication, we discussed the potential side effect of elevated blood pressure which is more likely to occur in individuals with uncontrolled hypertension. - encouraged reduction of caffeine and fluid management - desires expectant management at this time due to minimal bother

## 2024-10-28 NOTE — Progress Notes (Signed)
 [facility] Return Visit  SUBJECTIVE  History of Present Illness: Amanda West is a 78 y.o. female seen in follow-up for urethral caruncle, vaginal atrophy, nocturia, and urgency urinary incontinence. Plan at last visit was low dose vaginal estrogen, caffeine reduction and fluid management.   Stopped vaginal estrogen after 3 weeks due to lower abdominal cramping.  UUI 0-1x/day reduced from 2x/day, attributes to postponing urge at work Denies pad use. Denies vaginal bleeding or discharge Drinks most of her fluids at night, drinks coffee with dinner.  Nocturia: 2 times per night to void.  Denies bother from symptoms.  Past Medical History: Patient  has a past medical history of Abnormality of gait, Arthritis, Chronic constipation, Cornea scar, Eyes sensitive to light, bilateral, Femur fracture (HCC), IDA (iron deficiency anemia), Neuromuscular disorder (HCC), Osteoporosis, Spinal stenosis in cervical region (09/15/2014), Subcutaneous mass, Vitamin D  deficiency, and Wears glasses.   Past Surgical History: She  has a past surgical history that includes Pars plana vitrectomy (07/06/2012); Anterior cervical decomp/discectomy fusion (N/A, 09/21/2017); Cataract extraction w/ intraocular lens  implant, bilateral (1990's); Abdominal hernia repair (1980s); Vitrectomy and cataract (Right, ?); Mass excision (N/A, 12/19/2020); Tubal ligation; Anterior cervical decomp/discectomy fusion (N/A, 05/27/2022); CERVICAL THREE-FOUR ANTERIOR CERVICAL DECOMPRESSION/DISCECTOMY FUSION; Back surgery; Intramedullary (im) nail intertrochanteric (Right, 06/14/2023); and ORIF femur fracture.   Medications: She has a current medication list which includes the following prescription(s): apple cider vinegar, calcium-magnesium -zinc , capsicum (cayenne), carboxymethylcellulose, vitamin d , cinnamon, coconut oil, collagen-vitamin c -biotin, docusate sodium , duloxetine , green tea (camellia sinensis), polyethylene glycol,  senna, cyanocobalamin , ascorbic acid , zinc  gluconate, and estradiol .   Allergies: Patient is allergic to codeine and pamelor  [nortriptyline ].   Social History: Patient  reports that she quit smoking about 10 years ago. Her smoking use included cigarettes. She started smoking about 40 years ago. She has never used smokeless tobacco. She reports that she does not drink alcohol and does not use drugs.     OBJECTIVE     Physical Exam: Vitals:   10/28/24 1159  BP: 111/68  Pulse: 69   Physical Exam Constitutional:      General: She is not in acute distress.    Appearance: Normal appearance.  Genitourinary:     Urethral meatus normal.     Right Labia: No rash, tenderness, lesions, skin changes or Bartholin's cyst.    Left Labia: No tenderness, lesions, skin changes, Bartholin's cyst or rash.       Urethral meatus caruncle present.    No urethral prolapse, tenderness, mass, hypermobility or discharge present.  Cardiovascular:     Rate and Rhythm: Normal rate.  Pulmonary:     Effort: Pulmonary effort is normal. No respiratory distress.  Neurological:     Mental Status: She is alert.  Vitals reviewed. Exam conducted with a chaperone present.        No data to display             ASSESSMENT AND PLAN    Ms. Bassford is a 78 y.o. with:  1. Vaginal atrophy   2. Urethral caruncle     Vaginal atrophy Assessment & Plan: - discomfort during intercourse due to dryness, managed by Vaseline - For symptomatic vaginal atrophy options include lubrication with a water-based lubricant, personal hygiene measures and barrier protection against wetness, and estrogen replacement in the form of vaginal cream, vaginal tablets, or a time-released vaginal ring.   - loading dose of low dose vaginal estrogen with cramping - encouraged lubrication use for comfort  - consider  resuming at 1g twice a week maintenance dose if symptoms becomes bothersome    Urethral caruncle Assessment & Plan: -  stable 6-49mm urethral caruncle noted on exam, findings reviewed with patient   - no signs of infection, bleeding, or outlet obstruction with prior PVR 25mL - encouraged pt to monitor for clinical change that warrants immediate evaluation, discussed management options include expectant mgmt, vaginal estrogen, or surgical intervention if symptomatic - reassured patient and husband that they can resume intercourse  - discussed proper vulvar care, warm compression, avoid pad use, cotton only underwear and barrier ointment if needed. Pad dry - encouraged Vit E suppository, moisturizer with Replens/Revaree - discontinued low dose vaginal estrogen due to abdominal cramping, discussed possible resumption at maintenance dose if symptoms develop      Amanda ONEIDA Gillis, MD
